# Patient Record
Sex: Male | Born: 1935 | Race: Black or African American | Hispanic: No | Smoking: Former smoker
Health system: Southern US, Community
[De-identification: ages and names within clinical notes are randomized; demographics above are authoritative.]

## PROBLEM LIST (undated history)

## (undated) DIAGNOSIS — I10 Essential (primary) hypertension: Secondary | ICD-10-CM

## (undated) DIAGNOSIS — G459 Transient cerebral ischemic attack, unspecified: Secondary | ICD-10-CM

## (undated) DIAGNOSIS — I4891 Unspecified atrial fibrillation: Secondary | ICD-10-CM

## (undated) HISTORY — PX: EYE SURGERY: SHX253

---

## 2014-12-22 DIAGNOSIS — I10 Essential (primary) hypertension: Secondary | ICD-10-CM | POA: Diagnosis present

## 2018-01-16 DIAGNOSIS — I480 Paroxysmal atrial fibrillation: Secondary | ICD-10-CM | POA: Diagnosis present

## 2019-05-09 ENCOUNTER — Encounter (HOSPITAL_COMMUNITY): Payer: Self-pay

## 2019-05-09 ENCOUNTER — Emergency Department (HOSPITAL_COMMUNITY): Payer: Medicare Other

## 2019-05-09 ENCOUNTER — Other Ambulatory Visit: Payer: Self-pay

## 2019-05-09 ENCOUNTER — Emergency Department (HOSPITAL_COMMUNITY)
Admission: EM | Admit: 2019-05-09 | Discharge: 2019-05-10 | Disposition: A | Discharge status: Home / Self Care | Payer: Medicare Other | Attending: Emergency Medicine | Admitting: Emergency Medicine

## 2019-05-09 DIAGNOSIS — Z8673 Personal history of transient ischemic attack (TIA), and cerebral infarction without residual deficits: Secondary | ICD-10-CM | POA: Insufficient documentation

## 2019-05-09 DIAGNOSIS — R531 Weakness: Secondary | ICD-10-CM | POA: Diagnosis not present

## 2019-05-09 DIAGNOSIS — Z7982 Long term (current) use of aspirin: Secondary | ICD-10-CM | POA: Diagnosis not present

## 2019-05-09 DIAGNOSIS — Z79899 Other long term (current) drug therapy: Secondary | ICD-10-CM | POA: Insufficient documentation

## 2019-05-09 DIAGNOSIS — W1839XA Other fall on same level, initial encounter: Secondary | ICD-10-CM | POA: Insufficient documentation

## 2019-05-09 DIAGNOSIS — Z7901 Long term (current) use of anticoagulants: Secondary | ICD-10-CM | POA: Diagnosis not present

## 2019-05-09 DIAGNOSIS — W19XXXA Unspecified fall, initial encounter: Secondary | ICD-10-CM

## 2019-05-09 DIAGNOSIS — I10 Essential (primary) hypertension: Secondary | ICD-10-CM | POA: Diagnosis not present

## 2019-05-09 HISTORY — DX: Essential (primary) hypertension: I10

## 2019-05-09 NOTE — ED Provider Notes (Signed)
[facility] EMERGENCY DEPARTMENT Provider Note   CSN: [id number] Arrival date & time: 05/09/19  2210     History Chief Complaint  Patient presents with  . Fall    Christian Crawford is a 86 y.o. male.  Patient presents to the ED with a chief complaint of weakness.  He states that he was going to his front room to turn off the lights and states that his legs collapsed and he fell to the floor.  He denies tripping or stumbling.  He denies hitting his head, but cannot recall all of the events surrounding the event.  He states that he gathered his strength and called his daughter.  When his daughter arrived he was lying on the floor.  Has hx of prior TIA.  Denies any hx of seizures.  Patient denies numbness, weakness, tingling, headache, slurred speech, vision changes, chest pain, or SOB.  He denies any pain.  Denies any symptoms of any kind currently.  Daughter questions whether alcohol could have done this.  The history is provided by the patient. No language interpreter was used.       Past Medical History:  Diagnosis Date  . Hypertension     There are no problems to display for this patient.   History reviewed. No pertinent surgical history.     History reviewed. No pertinent family history.  Social History   Tobacco Use  . Smoking status: Never Smoker  . Smokeless tobacco: Never Used  Substance Use Topics  . Alcohol use: Not on file  . Drug use: Not on file    Home Medications Prior to Admission medications   Medication Sig Start Date End Date Taking? Authorizing Provider  allopurinol (ZYLOPRIM) 100 MG tablet Take 100 mg by mouth 2 (two) times daily.   Yes [provider]  amLODipine (NORVASC) 5 MG tablet Take 5 mg by mouth daily.   Yes [provider]  aspirin EC 81 MG tablet Take 81 mg by mouth daily.   Yes [provider]  atorvastatin (LIPITOR) 40 MG tablet Take 40 mg by mouth daily.   Yes [provider]    Garlic (GARLIQUE) 355 MG TBEC Take 400 mg by mouth daily.   Yes [provider]  hydrochlorothiazide (HYDRODIURIL) 25 MG tablet Take 25 mg by mouth daily.   Yes [provider]  losartan (COZAAR) 100 MG tablet Take 100 mg by mouth daily.   Yes [provider]  Metoprolol Tartrate 75 MG TABS Take 75 mg by mouth in the morning and at bedtime.   Yes [provider]  Multiple Vitamins-Minerals (MULTIVITAMIN WITH MINERALS) tablet Take 1 tablet by mouth daily.   Yes [provider]  pantoprazole (PROTONIX) 40 MG tablet Take 40 mg by mouth daily.   Yes [provider]  warfarin (COUMADIN) 5 MG tablet Take 5 mg by mouth daily.   Yes [provider]    Allergies    Patient has no known allergies.  Review of Systems   Review of Systems  All other systems reviewed and are negative.   Physical Exam Updated Vital Signs BP (!) 153/81 (BP Location: Right Arm)   Pulse 62   Temp 97.8 F (36.6 C) (Oral)   Resp (!) 24   SpO2 100%   Physical Exam Vitals and nursing note reviewed.  Constitutional:      Appearance: He is well-developed.  HENT:     Head: Normocephalic and atraumatic.  Eyes:  Conjunctiva/sclera: Conjunctivae normal.  Cardiovascular:     Rate and Rhythm: Normal rate and regular rhythm.     Heart sounds: No murmur.  Pulmonary:     Effort: Pulmonary effort is normal. No respiratory distress.     Breath sounds: Normal breath sounds.     Comments: CTAB Abdominal:     Palpations: Abdomen is soft.     Tenderness: There is no abdominal tenderness.     Comments: No focal abdominal pain  Musculoskeletal:        General: Normal range of motion.     Cervical back: Neck supple.  Skin:    General: Skin is warm and dry.  Neurological:     Mental Status: He is alert and oriented to person, place, and time.     Comments: CN 3-12 intact, speech is clear, movements goal oriented  Psychiatric:        Mood and Affect: Mood  normal.        Behavior: Behavior normal.     ED Results / Procedures / Treatments   Labs (all labs ordered are listed, but only abnormal results are displayed) Labs Reviewed  CBC WITH DIFFERENTIAL/PLATELET  BASIC METABOLIC PANEL  ETHANOL  PROTIME-INR  TROPONIN I (HIGH SENSITIVITY)    EKG None  Radiology DG Chest 2 View  Result Date: 05/09/2019 CLINICAL DATA:  Fall EXAM: CHEST - 2 VIEW COMPARISON:  None. FINDINGS: No significant pleural effusion. Linear atelectasis at the left base. Normal heart size. Aortic atherosclerosis. No pneumothorax. IMPRESSION: No active cardiopulmonary disease.  Mild atelectasis left base. Electronically Signed   By: Jasmine Pang M.D.   On: 05/09/2019 23:44   CT Head Wo Contrast  Result Date: 05/09/2019 CLINICAL DATA:  Head trauma, minor (Age >= 65y) EXAM: CT HEAD WITHOUT CONTRAST TECHNIQUE: Contiguous axial images were obtained from the base of the skull through the vertex without intravenous contrast. COMPARISON:  None. FINDINGS: Brain: No intracranial hemorrhage, mass effect, or midline shift. Moderate age related atrophy. No hydrocephalus. The basilar cisterns are patent. Moderate to advanced chronic small vessel ischemia. No evidence of territorial infarct or acute ischemia. No extra-axial or intracranial fluid collection. Vascular: Atherosclerosis of skullbase vasculature without hyperdense vessel or abnormal calcification. Skull: No fracture or focal lesion. Sinuses/Orbits: Minor mucosal thickening of scattered ethmoid air cells. No sinus fluid level. The mastoid air cells are clear. Included orbits are unremarkable. Other: Right temporal scalp hematoma or scarring. IMPRESSION: 1. No acute intracranial abnormality. No skull fracture. 2. Age related atrophy and chronic small vessel ischemia. Electronically Signed   By: Narda Rutherford M.D.   On: 05/09/2019 23:52    Procedures Procedures (including critical care time)  Medications Ordered in  ED Medications - No data to display  ED Course  I have reviewed the triage vital signs and the nursing notes.  Pertinent labs & imaging results that were available during my care of the patient were reviewed by me and considered in my medical decision making (see chart for details).    MDM Rules/Calculators/A&P                       Patient here with fall.  Questionable syncopal event.  Patient denies any pain.  Denies any injuries.  States that he felt weak and went to the ground.  He states that he does not recall of the events leading up to the fall.  He is reportedly on a blood thinner.  Check labs, EKG, CT  head, chest x-ray.  CT head negative for acute intracranial process.  Chest x-ray negative for active cardiopulmonary disease.  Patient has no complaints at this time.  I contacted his daughter by telephone after receiving his verbal consent to release health information to his daughter.  His daughter questions whether alcohol could have played a factor.  Will check EtOH.  Overall, the patient is well appearing.  Will check orthostats.  Labs are pending.  INR is elevated at 3.0.  Alcohol level is also elevated at 182.  Mild hyponatremia.  Give a liter of normal saline.  Discussed with Dr. Blinda Leatherwood, who recommends that patient sober up in the ED.  Will probably need to hold coumadin for a day or two.  2:22 AM Notified daughter of the plan.   6:17 AM Remains asymptomatic.  Ambulates without difficulty.  DC to home.     Final Clinical Impression(s) / ED Diagnoses Final diagnoses:  Fall, initial encounter    Rx / DC Orders ED Discharge Orders    None       Roxy Horseman, PA-C 05/10/19 0618    Gilda Crease, MD 05/10/19 [phone]

## 2019-05-09 NOTE — ED Notes (Signed)
Pt to room 35 when pt taken upstairs.

## 2019-05-09 NOTE — ED Notes (Signed)
[facility] [place], [phone]. Please call with update.

## 2019-05-09 NOTE — ED Notes (Addendum)
Pt back from xray and CT Orthostatics completed at bedside EKG completed and given to Dr. Blinda Leatherwood

## 2019-05-09 NOTE — ED Notes (Signed)
Patient daughter Sherley Bounds calling asking for an update [phone] please call back

## 2019-05-09 NOTE — ED Triage Notes (Addendum)
Pt brought in by EMS for c/o fall. Pt A&Ox4, in NAD. Breathing easy, non-labored. speaking in full sentences. Pt states he was getting ready for bed when his legs felt weak and gave out. Per EMS, pt did have episode of urinary incontinence after fall. Pt denies LOC. Denies hitting head. Reports he is on coumadin. Hx of TIA 2 yrs ago. Had negative covid test last week. Denies pain. CBG 107 18G LAC established by EMS. Flushes, secured with tape and tegaderm.

## 2019-05-09 NOTE — ED Notes (Addendum)
Pt taken to xray/CT

## 2019-05-10 DIAGNOSIS — R531 Weakness: Secondary | ICD-10-CM | POA: Diagnosis not present

## 2019-05-10 LAB — BASIC METABOLIC PANEL
Anion gap: 13 (ref 5–15)
BUN: 10 mg/dL (ref 8–23)
CO2: 23 mmol/L (ref 22–32)
Calcium: 8.8 mg/dL — ABNORMAL LOW (ref 8.9–10.3)
Chloride: 97 mmol/L — ABNORMAL LOW (ref 98–111)
Creatinine, Ser: 0.94 mg/dL (ref 0.61–1.24)
GFR calc Af Amer: >60 mL/min (ref >60)
GFR calc non Af Amer: >60 mL/min (ref >60)
Glucose, Bld: 94 mg/dL (ref 70–99)
Potassium: 4.3 mmol/L (ref 3.5–5.1)
Sodium: 133 mmol/L — ABNORMAL LOW (ref 135–145)

## 2019-05-10 LAB — PROTIME-INR
INR: 3 — ABNORMAL HIGH (ref 0.8–1.2)
Prothrombin Time: 31.1 seconds — ABNORMAL HIGH (ref 11.4–15.2)

## 2019-05-10 LAB — CBC WITH DIFFERENTIAL/PLATELET
Abs Immature Granulocytes: 0.01 10*3/uL (ref 0.00–0.07)
Basophils Absolute: 0.1 10*3/uL (ref 0.0–0.1)
Basophils Relative: 1 %
Eosinophils Absolute: 0.2 10*3/uL (ref 0.0–0.5)
Eosinophils Relative: 2 %
HCT: 39.8 % (ref 39.0–52.0)
Hemoglobin: 13.8 g/dL (ref 13.0–17.0)
Immature Granulocytes: 0 %
Lymphocytes Relative: 35 %
Lymphs Abs: 2.6 10*3/uL (ref 0.7–4.0)
MCH: 31.3 pg (ref 26.0–34.0)
MCHC: 34.7 g/dL (ref 30.0–36.0)
MCV: 90.2 fL (ref 80.0–100.0)
Monocytes Absolute: 0.8 10*3/uL (ref 0.1–1.0)
Monocytes Relative: 10 %
Neutro Abs: 3.9 10*3/uL (ref 1.7–7.7)
Neutrophils Relative %: 52 %
Platelets: 252 10*3/uL (ref 150–400)
RBC: 4.41 MIL/uL (ref 4.22–5.81)
RDW: 16.8 % — ABNORMAL HIGH (ref 11.5–15.5)
WBC: 7.5 10*3/uL (ref 4.0–10.5)
nRBC: 0 % (ref 0.0–0.2)

## 2019-05-10 LAB — TROPONIN I (HIGH SENSITIVITY)
Troponin I (High Sensitivity): 8 ng/L (ref <18)
Troponin I (High Sensitivity): 8 ng/L (ref <18)

## 2019-05-10 LAB — ETHANOL: Alcohol, Ethyl (B): 182 mg/dL — ABNORMAL HIGH (ref <10)

## 2019-05-10 MED ORDER — SODIUM CHLORIDE 0.9 % IV BOLUS
1000.0000 mL | Freq: Once | INTRAVENOUS | Status: AC
  Administered 2019-05-10: 1000 mL via INTRAVENOUS

## 2019-05-10 MED ORDER — ONDANSETRON HCL 4 MG/2ML IJ SOLN: 4.0000 mg | Freq: Once | INTRAMUSCULAR | Status: DC

## 2019-05-10 NOTE — ED Notes (Signed)
Patient verbalizes understanding of discharge instructions. Opportunity for questioning and answers were provided. All questions answered completely. PIV removed, catheter intact. Site dressed with gauze and tape. Armband removed by staff, pt discharged from ED. Ambulatory with strong, steady gait 

## 2019-05-10 NOTE — ED Notes (Signed)
Ambulated pt in hall. Pt felt no discomfort or dizziness, and was steady on feet. Pt ambulated independently with minimal assistance.

## 2019-05-10 NOTE — ED Notes (Signed)
All labs drawn, labeled with 2 pt identifiers, and sent to lab 

## 2019-05-10 NOTE — ED Notes (Signed)
Pt remains resting on cart in NAD. Alert, speaking in full sentences. Breathing easy, non-labored. VSS. Equal rise and fall of chest noted. Provided with urinal. Denies any other needs at this time. Hourly rounds completed. Call light within reach

## 2019-05-10 NOTE — ED Notes (Signed)
Pt resting on cart in NAD. Breathing easy, non-labored. Equal rise and fall of chest noted. VSS on monitors. Call light remains in reach. Hourly rounds completed. Will continue to monitor. Bed in lowest position, locked, with siderails upx2

## 2019-05-10 NOTE — ED Notes (Signed)
Pt given urinal. Denies any other needs at this time. Hourly rounds completed. Call light within reach. Will continue to monitor. Bed in lowest position, wheels locked. Side rails upx2

## 2019-05-10 NOTE — ED Notes (Signed)
Pt assisted with urinal

## 2019-05-10 NOTE — ED Notes (Signed)
Pt assisted with urinal and provided with phone to call daughter. Remains resting on cart in NAD.  Alert, speaking in full sentences. Breathing easy, non-labored. Call light within reach. Bed in lowest position, locked, with siderails upx2. VSS on monitors.

## 2019-05-10 NOTE — Discharge Instructions (Addendum)
Your weakness and fall are thought to be related to alcohol consumption.  Please use caution when drinking alcohol.  It is also important to stay hydrated with water.  Please don't take your coumadin/warfarin today and tomorrow.  You should have your level recheck by your doctor.  Your level was a little high today.

## 2019-06-29 ENCOUNTER — Ambulatory Visit: Payer: Medicare Other | Attending: Internal Medicine

## 2019-06-29 DIAGNOSIS — Z23 Encounter for immunization: Secondary | ICD-10-CM

## 2019-06-29 NOTE — Progress Notes (Signed)
   Covid-19 Vaccination Clinic  Name:  Christian Crawford    MRN: [id number] DOB: 28-Nov-1933  06/29/2019  [name] was observed post Covid-19 immunization for 15 minutes without incident. He was provided with Vaccine Information Sheet and instruction to access the V-Safe system.   [name] was instructed to call 911 with any severe reactions post vaccine: Marland Kitchen Difficulty breathing  . Swelling of face and throat  . A fast heartbeat  . A bad rash all over body  . Dizziness and weakness   Immunizations Administered    Name Date Dose VIS Date Route   Pfizer COVID-19 Vaccine 06/29/2019  3:56 PM 0.3 mL 03/05/2019 Intramuscular   Manufacturer: [employer], [employer]   Lot: [id number]   NDC: [address]-2

## 2019-07-23 ENCOUNTER — Other Ambulatory Visit: Payer: Self-pay

## 2019-07-23 ENCOUNTER — Encounter (HOSPITAL_COMMUNITY): Payer: Self-pay | Admitting: Emergency Medicine

## 2019-07-23 ENCOUNTER — Inpatient Hospital Stay (HOSPITAL_COMMUNITY)
Admission: EM | Admit: 2019-07-23 | Discharge: 2019-07-26 | DRG: 439 | Disposition: A | Discharge status: Home Health | Payer: Medicare Other | Attending: Family Medicine | Admitting: Family Medicine

## 2019-07-23 DIAGNOSIS — I10 Essential (primary) hypertension: Secondary | ICD-10-CM | POA: Diagnosis present

## 2019-07-23 DIAGNOSIS — F1090 Alcohol use, unspecified, uncomplicated: Secondary | ICD-10-CM | POA: Diagnosis present

## 2019-07-23 DIAGNOSIS — Z79899 Other long term (current) drug therapy: Secondary | ICD-10-CM

## 2019-07-23 DIAGNOSIS — R7401 Elevation of levels of liver transaminase levels: Secondary | ICD-10-CM

## 2019-07-23 DIAGNOSIS — Z20822 Contact with and (suspected) exposure to covid-19: Secondary | ICD-10-CM | POA: Diagnosis present

## 2019-07-23 DIAGNOSIS — Z7982 Long term (current) use of aspirin: Secondary | ICD-10-CM

## 2019-07-23 DIAGNOSIS — F329 Major depressive disorder, single episode, unspecified: Secondary | ICD-10-CM | POA: Diagnosis present

## 2019-07-23 DIAGNOSIS — I77811 Abdominal aortic ectasia: Secondary | ICD-10-CM

## 2019-07-23 DIAGNOSIS — R17 Unspecified jaundice: Secondary | ICD-10-CM

## 2019-07-23 DIAGNOSIS — R739 Hyperglycemia, unspecified: Secondary | ICD-10-CM

## 2019-07-23 DIAGNOSIS — E871 Hypo-osmolality and hyponatremia: Secondary | ICD-10-CM | POA: Diagnosis not present

## 2019-07-23 DIAGNOSIS — Z7901 Long term (current) use of anticoagulants: Secondary | ICD-10-CM

## 2019-07-23 DIAGNOSIS — F1010 Alcohol abuse, uncomplicated: Secondary | ICD-10-CM | POA: Diagnosis present

## 2019-07-23 DIAGNOSIS — K852 Alcohol induced acute pancreatitis without necrosis or infection: Principal | ICD-10-CM

## 2019-07-23 DIAGNOSIS — E785 Hyperlipidemia, unspecified: Secondary | ICD-10-CM | POA: Diagnosis present

## 2019-07-23 DIAGNOSIS — Z789 Other specified health status: Secondary | ICD-10-CM | POA: Diagnosis present

## 2019-07-23 DIAGNOSIS — I4891 Unspecified atrial fibrillation: Secondary | ICD-10-CM | POA: Diagnosis present

## 2019-07-23 DIAGNOSIS — Z8673 Personal history of transient ischemic attack (TIA), and cerebral infarction without residual deficits: Secondary | ICD-10-CM

## 2019-07-23 DIAGNOSIS — R7309 Other abnormal glucose: Secondary | ICD-10-CM

## 2019-07-23 HISTORY — DX: Transient cerebral ischemic attack, unspecified: G45.9

## 2019-07-23 HISTORY — DX: Unspecified atrial fibrillation: I48.91

## 2019-07-23 LAB — COMPREHENSIVE METABOLIC PANEL
ALT: 45 U/L — ABNORMAL HIGH (ref 0–44)
AST: 64 U/L — ABNORMAL HIGH (ref 15–41)
Albumin: 3.9 g/dL (ref 3.5–5.0)
Alkaline Phosphatase: 64 U/L (ref 38–126)
Anion gap: 12 (ref 5–15)
BUN: 7 mg/dL — ABNORMAL LOW (ref 8–23)
CO2: 22 mmol/L (ref 22–32)
Calcium: 9.1 mg/dL (ref 8.9–10.3)
Chloride: 93 mmol/L — ABNORMAL LOW (ref 98–111)
Creatinine, Ser: 0.96 mg/dL (ref 0.61–1.24)
GFR calc Af Amer: >60 mL/min (ref >60)
GFR calc non Af Amer: >60 mL/min (ref >60)
Glucose, Bld: 146 mg/dL — ABNORMAL HIGH (ref 70–99)
Potassium: 3.7 mmol/L (ref 3.5–5.1)
Sodium: 127 mmol/L — ABNORMAL LOW (ref 135–145)
Total Bilirubin: 1.3 mg/dL — ABNORMAL HIGH (ref 0.3–1.2)
Total Protein: 6.6 g/dL (ref 6.5–8.1)

## 2019-07-23 LAB — CBC
HCT: 38.9 % — ABNORMAL LOW (ref 39.0–52.0)
Hemoglobin: 13.6 g/dL (ref 13.0–17.0)
MCH: 31.2 pg (ref 26.0–34.0)
MCHC: 35 g/dL (ref 30.0–36.0)
MCV: 89.2 fL (ref 80.0–100.0)
Platelets: 298 10*3/uL (ref 150–400)
RBC: 4.36 MIL/uL (ref 4.22–5.81)
RDW: 14.3 % (ref 11.5–15.5)
WBC: 12.1 10*3/uL — ABNORMAL HIGH (ref 4.0–10.5)
nRBC: 0 % (ref 0.0–0.2)

## 2019-07-23 MED ORDER — SODIUM CHLORIDE 0.9% FLUSH
3.0000 mL | Freq: Once | INTRAVENOUS | Status: AC
  Administered 2019-07-24: 3 mL via INTRAVENOUS

## 2019-07-23 NOTE — ED Triage Notes (Signed)
Patient reports mid/low abdominal pain with emesis onset today , denies fever or diarrhea .

## 2019-07-23 NOTE — ED Notes (Signed)
Pt actively vomiting in WR.  NA noted at 127.

## 2019-07-24 ENCOUNTER — Emergency Department (HOSPITAL_COMMUNITY): Payer: Medicare Other

## 2019-07-24 ENCOUNTER — Encounter (HOSPITAL_COMMUNITY): Payer: Self-pay | Admitting: Internal Medicine

## 2019-07-24 DIAGNOSIS — F1010 Alcohol abuse, uncomplicated: Secondary | ICD-10-CM | POA: Diagnosis present

## 2019-07-24 DIAGNOSIS — Z79899 Other long term (current) drug therapy: Secondary | ICD-10-CM | POA: Diagnosis not present

## 2019-07-24 DIAGNOSIS — E785 Hyperlipidemia, unspecified: Secondary | ICD-10-CM | POA: Diagnosis present

## 2019-07-24 DIAGNOSIS — E871 Hypo-osmolality and hyponatremia: Secondary | ICD-10-CM | POA: Diagnosis present

## 2019-07-24 DIAGNOSIS — Z7982 Long term (current) use of aspirin: Secondary | ICD-10-CM | POA: Diagnosis not present

## 2019-07-24 DIAGNOSIS — I4891 Unspecified atrial fibrillation: Secondary | ICD-10-CM | POA: Diagnosis present

## 2019-07-24 DIAGNOSIS — Z8673 Personal history of transient ischemic attack (TIA), and cerebral infarction without residual deficits: Secondary | ICD-10-CM | POA: Diagnosis not present

## 2019-07-24 DIAGNOSIS — I10 Essential (primary) hypertension: Secondary | ICD-10-CM | POA: Diagnosis present

## 2019-07-24 DIAGNOSIS — R7401 Elevation of levels of liver transaminase levels: Secondary | ICD-10-CM | POA: Diagnosis present

## 2019-07-24 DIAGNOSIS — Z20822 Contact with and (suspected) exposure to covid-19: Secondary | ICD-10-CM | POA: Diagnosis present

## 2019-07-24 DIAGNOSIS — Z789 Other specified health status: Secondary | ICD-10-CM | POA: Diagnosis not present

## 2019-07-24 DIAGNOSIS — K852 Alcohol induced acute pancreatitis without necrosis or infection: Secondary | ICD-10-CM

## 2019-07-24 DIAGNOSIS — F1090 Alcohol use, unspecified, uncomplicated: Secondary | ICD-10-CM | POA: Diagnosis present

## 2019-07-24 DIAGNOSIS — R17 Unspecified jaundice: Secondary | ICD-10-CM | POA: Diagnosis present

## 2019-07-24 DIAGNOSIS — Z7901 Long term (current) use of anticoagulants: Secondary | ICD-10-CM | POA: Diagnosis not present

## 2019-07-24 DIAGNOSIS — F329 Major depressive disorder, single episode, unspecified: Secondary | ICD-10-CM | POA: Diagnosis present

## 2019-07-24 HISTORY — DX: Alcohol use, unspecified, uncomplicated: F10.90

## 2019-07-24 HISTORY — DX: Alcohol induced acute pancreatitis without necrosis or infection: K85.20

## 2019-07-24 LAB — URINALYSIS, ROUTINE W REFLEX MICROSCOPIC
Bilirubin Urine: NEGATIVE
Glucose, UA: NEGATIVE mg/dL
Hgb urine dipstick: NEGATIVE
Ketones, ur: NEGATIVE mg/dL
Leukocytes,Ua: NEGATIVE
Nitrite: NEGATIVE
Protein, ur: NEGATIVE mg/dL
Specific Gravity, Urine: 1.036 — ABNORMAL HIGH (ref 1.005–1.030)
pH: 7 (ref 5.0–8.0)

## 2019-07-24 LAB — COMPREHENSIVE METABOLIC PANEL
ALT: 123 U/L — ABNORMAL HIGH (ref 0–44)
AST: 304 U/L — ABNORMAL HIGH (ref 15–41)
Albumin: 3.7 g/dL (ref 3.5–5.0)
Alkaline Phosphatase: 95 U/L (ref 38–126)
Anion gap: 11 (ref 5–15)
BUN: 11 mg/dL (ref 8–23)
CO2: 24 mmol/L (ref 22–32)
Calcium: 8.6 mg/dL — ABNORMAL LOW (ref 8.9–10.3)
Chloride: 94 mmol/L — ABNORMAL LOW (ref 98–111)
Creatinine, Ser: 1.09 mg/dL (ref 0.61–1.24)
GFR calc Af Amer: >60 mL/min (ref >60)
GFR calc non Af Amer: >60 mL/min (ref >60)
Glucose, Bld: 141 mg/dL — ABNORMAL HIGH (ref 70–99)
Potassium: 4.1 mmol/L (ref 3.5–5.1)
Sodium: 129 mmol/L — ABNORMAL LOW (ref 135–145)
Total Bilirubin: 4.2 mg/dL — ABNORMAL HIGH (ref 0.3–1.2)
Total Protein: 6.3 g/dL — ABNORMAL LOW (ref 6.5–8.1)

## 2019-07-24 LAB — CBC
HCT: 39 % (ref 39.0–52.0)
Hemoglobin: 13.3 g/dL (ref 13.0–17.0)
MCH: 31.1 pg (ref 26.0–34.0)
MCHC: 34.1 g/dL (ref 30.0–36.0)
MCV: 91.1 fL (ref 80.0–100.0)
Platelets: 223 10*3/uL (ref 150–400)
RBC: 4.28 MIL/uL (ref 4.22–5.81)
RDW: 14.6 % (ref 11.5–15.5)
WBC: 8.9 10*3/uL (ref 4.0–10.5)
nRBC: 0 % (ref 0.0–0.2)

## 2019-07-24 LAB — LIPASE, BLOOD: Lipase: 1785 U/L — ABNORMAL HIGH (ref 11–51)

## 2019-07-24 LAB — MAGNESIUM: Magnesium: 1.6 mg/dL — ABNORMAL LOW (ref 1.7–2.4)

## 2019-07-24 LAB — PROTIME-INR
INR: 2.8 — ABNORMAL HIGH (ref 0.8–1.2)
Prothrombin Time: 28.3 seconds — ABNORMAL HIGH (ref 11.4–15.2)

## 2019-07-24 LAB — PHOSPHORUS: Phosphorus: 3.6 mg/dL (ref 2.5–4.6)

## 2019-07-24 LAB — RESPIRATORY PANEL BY RT PCR (FLU A&B, COVID)
Influenza A by PCR: NEGATIVE
Influenza B by PCR: NEGATIVE
SARS Coronavirus 2 by RT PCR: NEGATIVE

## 2019-07-24 MED ORDER — ACETAMINOPHEN 650 MG RE SUPP: 650.0000 mg | Freq: Four times a day (QID) | RECTAL | Status: DC | PRN

## 2019-07-24 MED ORDER — MORPHINE SULFATE (PF) 2 MG/ML IV SOLN: 2.0000 mg | INTRAVENOUS | Status: DC | PRN

## 2019-07-24 MED ORDER — LORAZEPAM 2 MG/ML IJ SOLN: 1.0000 mg | INTRAMUSCULAR | Status: DC | PRN

## 2019-07-24 MED ORDER — ONDANSETRON HCL 4 MG/2ML IJ SOLN
4.0000 mg | Freq: Four times a day (QID) | INTRAMUSCULAR | Status: DC | PRN
  Administered 2019-07-25: 4 mg via INTRAVENOUS
  Filled 2019-07-24: qty 2

## 2019-07-24 MED ORDER — WARFARIN - PHARMACIST DOSING INPATIENT: Freq: Every day | Status: DC

## 2019-07-24 MED ORDER — FOLIC ACID 1 MG PO TABS
1.0000 mg | ORAL_TABLET | Freq: Every day | ORAL | Status: DC
  Administered 2019-07-24 – 2019-07-26 (×3): 1 mg via ORAL
  Filled 2019-07-24 (×3): qty 1

## 2019-07-24 MED ORDER — ONDANSETRON HCL 4 MG/2ML IJ SOLN
4.0000 mg | Freq: Once | INTRAMUSCULAR | Status: AC
  Administered 2019-07-24: 4 mg via INTRAVENOUS
  Filled 2019-07-24: qty 2

## 2019-07-24 MED ORDER — IOHEXOL 300 MG/ML  SOLN
100.0000 mL | Freq: Once | INTRAMUSCULAR | Status: AC | PRN
  Administered 2019-07-24: 100 mL via INTRAVENOUS

## 2019-07-24 MED ORDER — ENOXAPARIN SODIUM 40 MG/0.4ML ~~LOC~~ SOLN: 40.0000 mg | SUBCUTANEOUS | Status: DC

## 2019-07-24 MED ORDER — THIAMINE HCL 100 MG/ML IJ SOLN
100.0000 mg | Freq: Every day | INTRAMUSCULAR | Status: DC
  Administered 2019-07-25: 100 mg via INTRAVENOUS
  Filled 2019-07-24: qty 2

## 2019-07-24 MED ORDER — SODIUM CHLORIDE 0.9 % IV SOLN: INTRAVENOUS | Status: DC

## 2019-07-24 MED ORDER — AMLODIPINE BESYLATE 5 MG PO TABS
5.0000 mg | ORAL_TABLET | Freq: Every day | ORAL | Status: DC
  Administered 2019-07-24 – 2019-07-26 (×3): 5 mg via ORAL
  Filled 2019-07-24 (×3): qty 1

## 2019-07-24 MED ORDER — METOPROLOL TARTRATE 25 MG PO TABS
75.0000 mg | ORAL_TABLET | Freq: Two times a day (BID) | ORAL | Status: DC
  Administered 2019-07-24 – 2019-07-26 (×5): 75 mg via ORAL
  Filled 2019-07-24 (×5): qty 3

## 2019-07-24 MED ORDER — ADULT MULTIVITAMIN W/MINERALS CH
1.0000 | ORAL_TABLET | Freq: Every day | ORAL | Status: DC
  Administered 2019-07-24 – 2019-07-26 (×3): 1 via ORAL
  Filled 2019-07-24 (×3): qty 1

## 2019-07-24 MED ORDER — SODIUM CHLORIDE 0.9 % IV BOLUS
1000.0000 mL | Freq: Once | INTRAVENOUS | Status: AC
  Administered 2019-07-24: 1000 mL via INTRAVENOUS

## 2019-07-24 MED ORDER — LORAZEPAM 1 MG PO TABS: 1.0000 mg | ORAL_TABLET | ORAL | Status: DC | PRN

## 2019-07-24 MED ORDER — ATORVASTATIN CALCIUM 40 MG PO TABS: 40.0000 mg | ORAL_TABLET | Freq: Every day | ORAL | Status: DC

## 2019-07-24 MED ORDER — LATANOPROST 0.005 % OP SOLN
1.0000 [drp] | Freq: Every day | OPHTHALMIC | Status: DC
  Administered 2019-07-24 – 2019-07-25 (×2): 1 [drp] via OPHTHALMIC
  Filled 2019-07-24: qty 2.5

## 2019-07-24 MED ORDER — ASPIRIN EC 81 MG PO TBEC
81.0000 mg | DELAYED_RELEASE_TABLET | Freq: Every day | ORAL | Status: DC
  Administered 2019-07-24 – 2019-07-26 (×3): 81 mg via ORAL
  Filled 2019-07-24 (×3): qty 1

## 2019-07-24 MED ORDER — ACETAMINOPHEN 325 MG PO TABS: 650.0000 mg | ORAL_TABLET | Freq: Four times a day (QID) | ORAL | Status: DC | PRN

## 2019-07-24 MED ORDER — ALLOPURINOL 100 MG PO TABS
100.0000 mg | ORAL_TABLET | Freq: Two times a day (BID) | ORAL | Status: DC
  Administered 2019-07-24 – 2019-07-26 (×5): 100 mg via ORAL
  Filled 2019-07-24 (×7): qty 1

## 2019-07-24 MED ORDER — THIAMINE HCL 100 MG PO TABS
100.0000 mg | ORAL_TABLET | Freq: Every day | ORAL | Status: DC
  Administered 2019-07-24 – 2019-07-26 (×2): 100 mg via ORAL
  Filled 2019-07-24 (×2): qty 1

## 2019-07-24 MED ORDER — MAGNESIUM SULFATE 2 GM/50ML IV SOLN
2.0000 g | Freq: Once | INTRAVENOUS | Status: AC
  Administered 2019-07-24: 2 g via INTRAVENOUS
  Filled 2019-07-24: qty 50

## 2019-07-24 MED ORDER — PANTOPRAZOLE SODIUM 40 MG PO TBEC
40.0000 mg | DELAYED_RELEASE_TABLET | Freq: Every day | ORAL | Status: DC
  Administered 2019-07-24 – 2019-07-26 (×3): 40 mg via ORAL
  Filled 2019-07-24 (×3): qty 1

## 2019-07-24 MED ORDER — MORPHINE SULFATE (PF) 4 MG/ML IV SOLN
4.0000 mg | Freq: Once | INTRAVENOUS | Status: AC
  Administered 2019-07-24: 4 mg via INTRAVENOUS
  Filled 2019-07-24: qty 1

## 2019-07-24 MED ORDER — WARFARIN SODIUM 5 MG PO TABS
5.0000 mg | ORAL_TABLET | Freq: Every day | ORAL | Status: DC
  Administered 2019-07-24 – 2019-07-25 (×2): 5 mg via ORAL
  Filled 2019-07-24 (×2): qty 1

## 2019-07-24 MED ORDER — ONDANSETRON HCL 4 MG PO TABS: 4.0000 mg | ORAL_TABLET | Freq: Four times a day (QID) | ORAL | Status: DC | PRN

## 2019-07-24 NOTE — H&P (Addendum)
History and Physical    Christian Crawford EVO:[id number] DOB: January 26, 1934 DOA: 07/23/2019  PCP: Virl Cagey, MD  Patient coming from: Home  I have personally briefly reviewed patient's old medical records in [facility]  Chief Complaint: Abd pain  HPI: Christian Crawford is a 86 y.o. male with medical history significant of A.Fib, TIA, on coumadin, HTN.  Pt drinks most days it sounds like.  Presents to the ED due to abd pain, located across lower abdomen.  Onset this afternoon after eating some ice cream and drinking "a double shot" of alcohol before the ice cream.  Pain is severe, 10/10, now down to 8/10.  No known h/o pancreatitis or gallstones in past.   ED Course: CT shows: Extensive diffuse acute pancreatitis, no pancreatic necrosis, pseudocyst.  Lipase 1785  WBC 12.1  [place]: CBD 5.46mm, no gallstones, normal looking gallbladder.   Review of Systems: As per HPI, otherwise all review of systems negative.  Past Medical History:  Diagnosis Date  . A-fib (HCC)   . Hypertension   . TIA (transient ischemic attack)     History reviewed. No pertinent surgical history.   reports that he has never smoked. He has never used smokeless tobacco. He reports current alcohol use. He reports that he does not use drugs.  No Known Allergies  No family history on file. No sick contacts  Prior to Admission medications   Medication Sig Start Date End Date Taking? Authorizing Provider  allopurinol (ZYLOPRIM) 100 MG tablet Take 100 mg by mouth 2 (two) times daily.   Yes [provider]  amLODipine (NORVASC) 5 MG tablet Take 5 mg by mouth daily.   Yes [provider]  aspirin EC 81 MG tablet Take 81 mg by mouth daily.   Yes [provider]  atorvastatin (LIPITOR) 40 MG tablet Take 40 mg by mouth daily.   Yes [provider]  Garlic (GARLIQUE) 400 MG TBEC Take 400 mg by mouth daily.   Yes [provider]  hydrochlorothiazide (HYDRODIURIL) 25 MG  tablet Take 25 mg by mouth daily.   Yes [provider]  latanoprost (XALATAN) 0.005 % ophthalmic solution Place 1 drop into both eyes at bedtime. 06/10/19  Yes [provider]  losartan (COZAAR) 100 MG tablet Take 100 mg by mouth daily.   Yes [provider]  Metoprolol Tartrate 75 MG TABS Take 75 mg by mouth in the morning and at bedtime.   Yes [provider]  Multiple Vitamins-Minerals (MULTIVITAMIN WITH MINERALS) tablet Take 1 tablet by mouth daily.   Yes [provider]  pantoprazole (PROTONIX) 40 MG tablet Take 40 mg by mouth daily.   Yes [provider]  warfarin (COUMADIN) 5 MG tablet Take 5 mg by mouth daily at 4 PM.    Yes [provider]    Physical Exam: Vitals:   07/23/19 2102 07/23/19 2119 07/24/19 0004  BP: 117/69  (!) 141/81  Pulse: 74  87  Resp: 16  16  Temp: 98 F (36.7 C)  97.9 F (36.6 C)  TempSrc: Oral  Oral  SpO2: 96%  99%  Weight:  100 kg   Height:  6\' 1"  (1.854 m)     Constitutional: NAD, calm, comfortable Eyes: PERRL, lids and conjunctivae normal ENMT: Mucous membranes are moist. Posterior pharynx clear of any exudate or lesions.Normal dentition.  Neck: normal, supple, no masses, no thyromegaly Respiratory: clear to auscultation bilaterally, no wheezing, no crackles. Normal respiratory effort. No accessory muscle  use.  Cardiovascular: Regular rate and rhythm, no murmurs / rubs / gallops. No extremity edema. 2+ pedal pulses. No carotid bruits.  Abdomen: epigastric TTP Musculoskeletal: no clubbing / cyanosis. No joint deformity upper and lower extremities. Good ROM, no contractures. Normal muscle tone.  Skin: no rashes, lesions, ulcers. No induration Neurologic: CN 2-12 grossly intact. Sensation intact, DTR normal. Strength 5/5 in all 4.  Psychiatric: Normal judgment and insight. Alert and oriented x 3. Normal mood.    Labs on Admission: I have personally reviewed following labs and imaging  studies  CBC: Recent Labs  Lab 07/23/19 2134  WBC 12.1*  HGB 13.6  HCT 38.9*  MCV 89.2  PLT 298   Basic Metabolic Panel: Recent Labs  Lab 07/23/19 2134  NA 127*  K 3.7  CL 93*  CO2 22  GLUCOSE 146*  BUN 7*  CREATININE 0.96  CALCIUM 9.1   GFR: Estimated Creatinine Clearance: 71.2 mL/min (by C-G formula based on SCr of 0.96 mg/dL). Liver Function Tests: Recent Labs  Lab 07/23/19 2134  AST 64*  ALT 45*  ALKPHOS 64  BILITOT 1.3*  PROT 6.6  ALBUMIN 3.9   Recent Labs  Lab 07/23/19 2134  LIPASE 1,785*   No results for input(s): AMMONIA in the last 168 hours. Coagulation Profile: No results for input(s): INR, PROTIME in the last 168 hours. Cardiac Enzymes: No results for input(s): CKTOTAL, CKMB, CKMBINDEX, TROPONINI in the last 168 hours. BNP (last 3 results) No results for input(s): PROBNP in the last 8760 hours. HbA1C: No results for input(s): HGBA1C in the last 72 hours. CBG: No results for input(s): GLUCAP in the last 168 hours. Lipid Profile: No results for input(s): CHOL, HDL, LDLCALC, TRIG, CHOLHDL, LDLDIRECT in the last 72 hours. Thyroid Function Tests: No results for input(s): TSH, T4TOTAL, FREET4, T3FREE, THYROIDAB in the last 72 hours. Anemia Panel: No results for input(s): VITAMINB12, FOLATE, FERRITIN, TIBC, IRON, RETICCTPCT in the last 72 hours. Urine analysis: No results found for: COLORURINE, APPEARANCEUR, LABSPEC, PHURINE, GLUCOSEU, HGBUR, BILIRUBINUR, KETONESUR, PROTEINUR, UROBILINOGEN, NITRITE, LEUKOCYTESUR  Radiological Exams on Admission: CT ABDOMEN PELVIS W CONTRAST  Result Date: 07/24/2019 CLINICAL DATA:  Mid abdominal pain EXAM: CT ABDOMEN AND PELVIS WITH CONTRAST TECHNIQUE: Multidetector CT imaging of the abdomen and pelvis was performed using the standard protocol following bolus administration of intravenous contrast. CONTRAST:  OMNIPAQUE IOHEXOL 300 MG/ML  SOLN COMPARISON:  None. FINDINGS: Lower chest: The visualized heart size  within normal limits. No pericardial fluid/thickening. No hiatal hernia. Minimal basilar atelectasis is seen. Hepatobiliary: There is diffuse low density seen throughout the liver parenchyma.The main portal vein is patent. No evidence of calcified gallstones, gallbladder wall thickening or biliary dilatation. Pancreas: There is significant fat stranding changes seen throughout the entirety of the pancreas. There is still normal pancreatic enhancement seen throughout. No pancreatic ductal dilatation. Probable vascular calcifications seen within the distal body of the pancreas. No surrounding pancreatic fluid collections or evidence of pancreatic necrosis. Fat stranding changes are seen extending along the retroperitoneum into the deep pelvis and bilateral pericolic gutters. Spleen: Normal in size without focal abnormality. Adrenals/Urinary Tract: Both adrenal glands appear normal. The kidneys and collecting system appear normal without evidence of urinary tract calculus or hydronephrosis. Bladder is unremarkable. Stomach/Bowel: There is a small hiatal hernia present. Fat stranding changes are seen surrounding the first and second portion of the duodenum. The remainder of the small bowel is unremarkable. There is scattered colonic diverticula without diverticulitis. The appendix is unremarkable.  Vascular/Lymphatic: There are no enlarged mesenteric, retroperitoneal, or pelvic lymph nodes. Scattered aortic atherosclerotic calcifications are seen without aneurysmal dilatation. The intra-abdominal aorta measures up to 2.8 cm at the level of the origin of the celiac plexus. Reproductive: The prostate is heterogeneous and enlarged Other: No evidence of abdominal wall mass or hernia. Musculoskeletal: No acute or significant osseous findings. IMPRESSION: 1. Findings suggestive of extensive diffuse acute pancreatitis with inflammatory changes extending into the bilateral pericolic gutters. No evidence of pancreatic necrosis or  surrounding pseudocyst. 2. Surrounding inflammatory changes around the duodenum. 3. 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 Recommend followup by ultrasound in 5 years. This recommendation follows ACR consensus guidelines: White Paper of the ACR Incidental Findings Committee II on Vascular Findings. J Am Coll Radiol 2013; 10:789-794. 4. Aortic aneurysm NOS (ICD10-I71.9) 5.  Aortic Atherosclerosis (ICD10-I70.0). Electronically Signed   By: Prudencio Pair M.D.   On: 07/24/2019 02:37   US Abdomen Limited  Result Date: 07/24/2019 CLINICAL DATA:  Pancreatitis EXAM: ULTRASOUND ABDOMEN LIMITED RIGHT UPPER QUADRANT COMPARISON:  CT same day FINDINGS: Gallbladder: Layering echogenic gallbladder sludge is present. No sonographic Percell Miller sign is seen. The gallbladder wall is normal in appearance measuring 2 mm. Common bile duct: Diameter: 5.2 mm Liver: Increased echotexture seen throughout. No focal abnormality or biliary ductal dilatation. Portal vein is patent on color Doppler imaging with normal direction of blood flow towards the liver. Other: None. IMPRESSION: Gallbladder sludge.  No evidence of acute cholecystitis. Hepatic steatosis Electronically Signed   By: Prudencio Pair M.D.   On: 07/24/2019 04:03    EKG: Independently reviewed.  Assessment/Plan Principal Problem:   Acute alcoholic pancreatitis Active Problems:   Heavy alcohol use   HTN (hypertension)   A-fib (HCC)    1. Acute alcoholic pancreatitis - 1. IVF: 1L bolus in ED and NS at 125 cc/hr 2. Strict intake and output 3. NPO 4. Morphine PRN pain 5. zofran PRN nausea 6. Instructed to stop drinking EtOH 2. Heavy EtOH use - 1. CIWA 3. HTN - 1. Cont Metoprolol and norvasc 2. Holding losartan and HCTZ 4. A.Fib - 1. Cont metoprolol 2. Tele monitor 3. Cont coumadin 4. INR pending  DVT prophylaxis: Coumadin per pharm Code Status: Full Family Communication: Family at bedside Disposition  Plan: Home after pancreatitis improved and taking POs Consults called: None Admission status: Place in 40   [facility] [place], [place] Hospitalists  How to contact the [facility] Attending or Consulting provider [place] or covering provider during after hours [place], for this patient?  1. Check the care team in [facility] and look for a) attending/consulting [facility] provider listed and b) the [facility] team listed 2. Log into [email or web address]  Amion Physician Scheduling and messaging for groups and whole hospitals  On call and physician scheduling software for group practices, residents, hospitalists and other medical providers for call, clinic, rotation and shift schedules. OnCall [employer] is a hospital-wide system for scheduling doctors and paging doctors on call. EasyPlot is for scientific plotting and data analysis.  [email or web address]  and use [place]'s universal password to access. If you do not have the password, please contact the hospital operator.  3. Locate the [facility] provider you are looking for under [facility] and page to a number that you can be directly reached. 4. If you still have difficulty reaching the provider, please page the [facility] (Director on Call) for the Hospitalists listed on amion for assistance.  07/24/2019, 4:32 AM

## 2019-07-24 NOTE — Progress Notes (Signed)
ANTICOAGULATION CONSULT NOTE - Initial Consult  Pharmacy Consult for Coumadin Indication: atrial fibrillation  No Known Allergies  Patient Measurements: Height: 6\' 1"  (185.4 cm) Weight: 100 kg (220 lb 7.4 oz) IBW/kg (Calculated) : 79.9  Vital Signs: Temp: 97.9 F (36.6 C) (05/01 0004) Temp Source: Oral (05/01 0004) BP: 155/83 (05/01 0345) Pulse Rate: 85 (05/01 0345)  Labs: Recent Labs    07/23/19 2134 07/24/19 0622  HGB 13.6 13.3  HCT 38.9* 39.0  PLT 298 223  LABPROT  --  28.3*  INR  --  2.8*  CREATININE 0.96 1.09    Estimated Creatinine Clearance: 62.7 mL/min (by C-G formula based on SCr of 1.09 mg/dL).   Medical History: Past Medical History:  Diagnosis Date  . A-fib (HCC)   . Hypertension   . TIA (transient ischemic attack)     Medications:  See electronic med rec  Assessment: 86 y.o. M presents with abd pain. Pt on warfarin PTA for afib. Admission INR 2.8. CBC ok on admission. Home dose: Coumadin 5mg  po daily  Goal of Therapy:  INR 2-3 Monitor platelets by anticoagulation protocol: Yes   Plan:  Daily INR Coumadin 5mg  po daily  97, PharmD, BCPS Please see amion for complete clinical pharmacist phone list 07/24/2019,7:11 AM

## 2019-07-24 NOTE — ED Provider Notes (Signed)
[facility] EMERGENCY DEPARTMENT Provider Note   CSN: [id number] Arrival date & time: 07/23/19  2042   History Chief Complaint  Patient presents with  . Abdominal Pain    Christian Crawford is a 86 y.o. male.  The history is provided by the patient.  Abdominal Pain He has history of hypertension and alcohol abuse and comes in because of pain across his lower abdomen.  He states it started this afternoon after eating some ice cream.  He does admit to drinking "a double shot" of alcohol before he had the ice cream.  There has been associated nausea and vomiting but no radiation of pain to the back.  Pain was severe and he rated it at 10/10, but has subsided to where he states it is down to 8/10.  Nothing makes it better, nothing makes it worse.  He denies history of pancreatitis in the past.  He does not have any known history of gallstones.  Past Medical History:  Diagnosis Date  . Hypertension     There are no problems to display for this patient.   History reviewed. No pertinent surgical history.     No family history on file.  Social History   Tobacco Use  . Smoking status: Never Smoker  . Smokeless tobacco: Never Used  Substance Use Topics  . Alcohol use: Yes  . Drug use: Never    Home Medications Prior to Admission medications   Medication Sig Start Date End Date Taking? Authorizing Provider  allopurinol (ZYLOPRIM) 100 MG tablet Take 100 mg by mouth 2 (two) times daily.    [provider]  amLODipine (NORVASC) 5 MG tablet Take 5 mg by mouth daily.    [provider]  aspirin EC 81 MG tablet Take 81 mg by mouth daily.    [provider]  atorvastatin (LIPITOR) 40 MG tablet Take 40 mg by mouth daily.    [provider]  Garlic (GARLIQUE) 242 MG TBEC Take 400 mg by mouth daily.    [provider]  hydrochlorothiazide (HYDRODIURIL) 25 MG tablet Take 25 mg by mouth daily.    [provider]    losartan (COZAAR) 100 MG tablet Take 100 mg by mouth daily.    [provider]  Metoprolol Tartrate 75 MG TABS Take 75 mg by mouth in the morning and at bedtime.    [provider]  Multiple Vitamins-Minerals (MULTIVITAMIN WITH MINERALS) tablet Take 1 tablet by mouth daily.    [provider]  pantoprazole (PROTONIX) 40 MG tablet Take 40 mg by mouth daily.    [provider]  warfarin (COUMADIN) 5 MG tablet Take 5 mg by mouth daily. @ 1700    [provider]    Allergies    Patient has no known allergies.  Review of Systems   Review of Systems  Gastrointestinal: Positive for abdominal pain.  All other systems reviewed and are negative.   Physical Exam Updated Vital Signs BP (!) 141/81 (BP Location: Right Arm)   Pulse 87   Temp 97.9 F (36.6 C) (Oral)   Resp 16   Ht 6\' 1"  (1.854 m)   Wt 100 kg   SpO2 99%   BMI 29.09 kg/m   Physical Exam Vitals and nursing note reviewed.   86 year old male, resting comfortably and in no acute distress. Vital signs are significant for borderline elevated blood pressure. Oxygen saturation is 99%, which is normal. Head is normocephalic and atraumatic. PERRLA,  EOMI. Oropharynx is clear. Neck is nontender and supple without adenopathy or JVD. Back is nontender and there is no CVA tenderness. Lungs are clear without rales, wheezes, or rhonchi. Chest is nontender. Heart has regular rate and rhythm without murmur. Abdomen is soft, flat, with mild tenderness rather diffusely but worse across the lower abdomen.  There is negative Murphy sign.  There are no masses or hepatosplenomegaly and peristalsis is hypoactive. Extremities have no cyanosis or edema, full range of motion is present. Skin is warm and dry without rash. Neurologic: Mental status is normal, cranial nerves are intact, there are no motor or sensory deficits.  ED Results / Procedures / Treatments   Labs (all labs ordered are listed, but  only abnormal results are displayed) Labs Reviewed  LIPASE, BLOOD - Abnormal; Notable for the following components:      Result Value   Lipase 1,785 (*)    All other components within normal limits  COMPREHENSIVE METABOLIC PANEL - Abnormal; Notable for the following components:   Sodium 127 (*)    Chloride 93 (*)    Glucose, Bld 146 (*)    BUN 7 (*)    AST 64 (*)    ALT 45 (*)    Total Bilirubin 1.3 (*)    All other components within normal limits  CBC - Abnormal; Notable for the following components:   WBC 12.1 (*)    HCT 38.9 (*)    All other components within normal limits  URINALYSIS, ROUTINE W REFLEX MICROSCOPIC    EKG None  Radiology CT ABDOMEN PELVIS W CONTRAST  Result Date: 07/24/2019 CLINICAL DATA:  Mid abdominal pain EXAM: CT ABDOMEN AND PELVIS WITH CONTRAST TECHNIQUE: Multidetector CT imaging of the abdomen and pelvis was performed using the standard protocol following bolus administration of intravenous contrast. CONTRAST:  OMNIPAQUE IOHEXOL 300 MG/ML  SOLN COMPARISON:  None. FINDINGS: Lower chest: The visualized heart size within normal limits. No pericardial fluid/thickening. No hiatal hernia. Minimal basilar atelectasis is seen. Hepatobiliary: There is diffuse low density seen throughout the liver parenchyma.The main portal vein is patent. No evidence of calcified gallstones, gallbladder wall thickening or biliary dilatation. Pancreas: There is significant fat stranding changes seen throughout the entirety of the pancreas. There is still normal pancreatic enhancement seen throughout. No pancreatic ductal dilatation. Probable vascular calcifications seen within the distal body of the pancreas. No surrounding pancreatic fluid collections or evidence of pancreatic necrosis. Fat stranding changes are seen extending along the retroperitoneum into the deep pelvis and bilateral pericolic gutters. Spleen: Normal in size without focal abnormality. Adrenals/Urinary Tract: Both  adrenal glands appear normal. The kidneys and collecting system appear normal without evidence of urinary tract calculus or hydronephrosis. Bladder is unremarkable. Stomach/Bowel: There is a small hiatal hernia present. Fat stranding changes are seen surrounding the first and second portion of the duodenum. The remainder of the small bowel is unremarkable. There is scattered colonic diverticula without diverticulitis. The appendix is unremarkable. Vascular/Lymphatic: There are no enlarged mesenteric, retroperitoneal, or pelvic lymph nodes. Scattered aortic atherosclerotic calcifications are seen without aneurysmal dilatation. The intra-abdominal aorta measures up to 2.8 cm at the level of the origin of the celiac plexus. Reproductive: The prostate is heterogeneous and enlarged Other: No evidence of abdominal wall mass or hernia. Musculoskeletal: No acute or significant osseous findings. IMPRESSION: 1. Findings suggestive of extensive diffuse acute pancreatitis with inflammatory changes extending into the bilateral pericolic gutters. No evidence of pancreatic necrosis or surrounding pseudocyst. 2. Surrounding inflammatory changes  around the duodenum. 3. 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 Recommend followup by ultrasound in 5 years. This recommendation follows ACR consensus guidelines: White Paper of the ACR Incidental Findings Committee II on Vascular Findings. J Am Coll Radiol 2013; 10:789-794. 4. Aortic aneurysm NOS (ICD10-I71.9) 5.  Aortic Atherosclerosis (ICD10-I70.0). Electronically Signed   By: Jonna Clark M.D.   On: 07/24/2019 02:37    Procedures Procedures  Medications Ordered in ED Medications  sodium chloride flush (NS) 0.9 % injection 3 mL (has no administration in time range)    ED Course  I have reviewed the triage vital signs and the nursing notes.  Pertinent labs & imaging results that were available during my care of the patient  were reviewed by me and considered in my medical decision making (see chart for details).  MDM Rules/Calculators/A&P Abdominal pain.  When I saw patient, labs did come back showing markedly elevated lipase consistent with pancreatitis.  Also, mild elevation of transaminases and bilirubin consistent with his known history of alcohol abuse.  Glucose is mildly elevated will need to be followed as an outpatient.  Hyponatremia is present likely secondary to alcohol abuse.  He is sent for CT of abdomen and pelvis which showed extensive inflammatory changes around the pancreas consistent with acute pancreatitis.  Also, incidental finding of ectatic abdominal aorta at risk for aneurysm development with need for follow-up ultrasound in 5 years.  He will be sent for ultrasound to look for evidence of cholelithiasis and choledocholithiasis.  Given his age and severity of pancreatic inflammation, decision is made to admit him here.  Case is discussed with Dr. Julian Reil of [facility], who agrees to admit the patient.  Final Clinical Impression(s) / ED Diagnoses Final diagnoses:  Alcohol-induced acute pancreatitis without infection or necrosis  Hyponatremia  Elevated transaminase level  Serum total bilirubin elevated  Ectatic abdominal aorta (HCC)  Elevated random blood glucose level    Rx / DC Orders ED Discharge Orders    None       Dione Booze, MD 07/24/19 [phone]

## 2019-07-24 NOTE — Progress Notes (Signed)
Per HPI: Christian Crawford is a 86 y.o. male with medical history significant of A.Fib, TIA, on coumadin, HTN.  Pt drinks most days it sounds like.  Presents to the ED due to abd pain, located across lower abdomen.  Onset this afternoon after eating some ice cream and drinking "a double shot" of alcohol before the ice cream.  Pain is severe, 10/10, now down to 8/10.  No known h/o pancreatitis or gallstones in past.  5/1: Patient was admitted with extensive diffuse acute pancreatitis in the setting of alcohol use.  He denies any abdominal pain, nausea or vomiting this morning.  Repeat bolus of IV fluid and maintain on aggressive IV fluid.  Start clear liquid diet since symptomatically improved.  Discussed case with GI given transaminitis.  We will continue to follow in a.m. and trend and will consider formal consultation if needed at that point.  Abdominal ultrasound without any acute concerns at this time.  May need to consider MRCP during this admission if trend is worsening.  Total care time: 35 minutes.

## 2019-07-25 LAB — COMPREHENSIVE METABOLIC PANEL
ALT: 120 U/L — ABNORMAL HIGH (ref 0–44)
AST: 156 U/L — ABNORMAL HIGH (ref 15–41)
Albumin: 3 g/dL — ABNORMAL LOW (ref 3.5–5.0)
Alkaline Phosphatase: 92 U/L (ref 38–126)
Anion gap: 9 (ref 5–15)
BUN: 10 mg/dL (ref 8–23)
CO2: 24 mmol/L (ref 22–32)
Calcium: 8.4 mg/dL — ABNORMAL LOW (ref 8.9–10.3)
Chloride: 99 mmol/L (ref 98–111)
Creatinine, Ser: 0.89 mg/dL (ref 0.61–1.24)
GFR calc Af Amer: >60 mL/min (ref >60)
GFR calc non Af Amer: >60 mL/min (ref >60)
Glucose, Bld: 117 mg/dL — ABNORMAL HIGH (ref 70–99)
Potassium: 3.7 mmol/L (ref 3.5–5.1)
Sodium: 132 mmol/L — ABNORMAL LOW (ref 135–145)
Total Bilirubin: 4 mg/dL — ABNORMAL HIGH (ref 0.3–1.2)
Total Protein: 5.7 g/dL — ABNORMAL LOW (ref 6.5–8.1)

## 2019-07-25 LAB — CBC
HCT: 34.1 % — ABNORMAL LOW (ref 39.0–52.0)
Hemoglobin: 12 g/dL — ABNORMAL LOW (ref 13.0–17.0)
MCH: 31.7 pg (ref 26.0–34.0)
MCHC: 35.2 g/dL (ref 30.0–36.0)
MCV: 90 fL (ref 80.0–100.0)
Platelets: 200 10*3/uL (ref 150–400)
RBC: 3.79 MIL/uL — ABNORMAL LOW (ref 4.22–5.81)
RDW: 14.5 % (ref 11.5–15.5)
WBC: 12.4 10*3/uL — ABNORMAL HIGH (ref 4.0–10.5)
nRBC: 0 % (ref 0.0–0.2)

## 2019-07-25 LAB — PROTIME-INR
INR: 2.6 — ABNORMAL HIGH (ref 0.8–1.2)
Prothrombin Time: 27.3 seconds — ABNORMAL HIGH (ref 11.4–15.2)

## 2019-07-25 LAB — MAGNESIUM: Magnesium: 1.9 mg/dL (ref 1.7–2.4)

## 2019-07-25 NOTE — Plan of Care (Signed)

## 2019-07-25 NOTE — Discharge Instructions (Signed)

## 2019-07-25 NOTE — Progress Notes (Signed)
ANTICOAGULATION CONSULT NOTE  Pharmacy Consult for Coumadin Indication: atrial fibrillation  No Known Allergies  Patient Measurements: Height: 6\' 1"  (185.4 cm) Weight: 100 kg (220 lb 7.4 oz) IBW/kg (Calculated) : 79.9  Vital Signs: Temp: 98.3 F (36.8 C) (05/02 0740) Temp Source: Oral (05/01 2242) BP: 139/85 (05/02 0740) Pulse Rate: 97 (05/02 0740)  Labs: Recent Labs    07/23/19 2134 07/23/19 2134 07/24/19 0622 07/25/19 0513  HGB 13.6   < > 13.3 12.0*  HCT 38.9*  --  39.0 34.1*  PLT 298  --  223 200  LABPROT  --   --  28.3* 27.3*  INR  --   --  2.8* 2.6*  CREATININE 0.96  --  1.09 0.89   < > = values in this interval not displayed.    Estimated Creatinine Clearance: 76.8 mL/min (by C-G formula based on SCr of 0.89 mg/dL).   Medical History: Past Medical History:  Diagnosis Date  . A-fib (HCC)   . Hypertension   . TIA (transient ischemic attack)     Assessment: 86 y.o. M presents with abd pain. Pt on warfarin PTA for afib. Admission INR 2.8.   INR is therapeutic at 2.6. Hgb 12, plt 200. No s/sx of bleeding documented.   Home dose: Coumadin 5mg  po daily  Goal of Therapy:  INR 2-3 Monitor platelets by anticoagulation protocol: Yes   Plan:  Coumadin 5mg  po daily Daily INR, CBC, and for s/sx of bleeding    97, PharmD, BCCCP Clinical Pharmacist  Phone: [phone] 07/25/2019 8:55 AM  Please check AMION for all [facility] phone numbers After 10:00 PM, call Main Pharmacy [phone]

## 2019-07-25 NOTE — Progress Notes (Addendum)
PROGRESS NOTE    Christian Crawford  DJM:[id number] DOB: Nov 18, 1933 DOA: 07/23/2019 PCP: Virl Cagey, MD   Brief Narrative:  Per HPI: Christian Crawford a 86 y.o.malewith medical history significant ofA.Fib, TIA, on coumadin, HTN.  Pt drinks most days it sounds like. Presents to the ED due to abd pain, located across lower abdomen. Onset this afternoon after eating some ice cream and drinking "a double shot" of alcohol before the ice cream. Pain is severe, 10/10, now down to 8/10. No known h/o pancreatitis or gallstones in past.  5/1: Patient was admitted with extensive diffuse acute pancreatitis in the setting of alcohol use.  He denies any abdominal pain, nausea or vomiting this morning.  Repeat bolus of IV fluid and maintain on aggressive IV fluid.  Start clear liquid diet since symptomatically improved.  Discussed case with GI given transaminitis.  We will continue to follow in a.m. and trend and will consider formal consultation if needed at that point.  Abdominal ultrasound without any acute concerns at this time.  May need to consider MRCP during this admission if trend is worsening.  5/2: Patient is having much less abdominal pain today with no nausea or vomiting noted.  He appears to be tolerating clear liquid diet and would like to have his diet advanced.  We will plan to start full liquids today and continue aggressive IV fluid hydration.  LFTs are downward trending.  Anticipate discharge in the next 24-48 hours pending further decline in LFTs and if tolerating diet.  Continue aggressive IV fluid.  Assessment & Plan:   Principal Problem:   Acute alcoholic pancreatitis Active Problems:   Heavy alcohol use   HTN (hypertension)   A-fib (HCC)   Acute alcoholic pancreatitis with associated transaminitis -Advance diet to full liquids today -Monitor repeat LFTs in a.m. to ensure downward trend -Continue aggressive IV fluid -Counseled on alcohol cessation  Chronic, heavy  alcohol use -No signs of withdrawal noted -CIWA protocol -Counseled on cessation  Hypertension-stable -Continue metoprolol and Norvasc -Holding losartan and HCTZ  Atrial fibrillation-rate controlled -Continue metoprolol -Continue Coumadin for anticoagulation with therapeutic INR  Dyslipidemia -Holding statin for now given transaminitis -May resume on discharge once elevations have diminished  DVT prophylaxis: Therapeutic on Coumadin Code Status: Full Family Communication: None at bedside, tried calling daughter with no response Disposition Plan: Continue aggressive IV fluid hydration and monitoring of LFTs.  Monitor clinically with dietary advancement.  Anticipate discharge in next 24-48 hours of stable.   Consultants:   None  Procedures:   None  Antimicrobials:   None   Subjective: Patient seen and evaluated today with improving abdominal pain with no further nausea or vomiting.  Tolerating clear liquids.  Objective: Vitals:   07/24/19 1131 07/24/19 1132 07/24/19 1633 07/24/19 2242  BP: 131/87 131/87 (!) 156/82 (!) 149/77  Pulse:  (!) 117 86 91  Resp:   18 16  Temp: 97.7 F (36.5 C) 97.7 F (36.5 C) (!) 97.5 F (36.4 C) 98.3 F (36.8 C)  TempSrc: Oral Oral  Oral  SpO2: 97% 96% 96% 98%  Weight:      Height:        Intake/Output Summary (Last 24 hours) at 07/25/2019 0848 Last data filed at 07/25/2019 0400 Gross per 24 hour  Intake 2893 ml  Output 350 ml  Net 2543 ml   Filed Weights   07/23/19 2119  Weight: 100 kg    Examination:  General exam: Appears calm and comfortable  Respiratory system: Clear  to auscultation. Respiratory effort normal. Cardiovascular system: S1 & S2 heard, RRR. No JVD, murmurs, rubs, gallops or clicks. No pedal edema. Gastrointestinal system: Abdomen is nondistended, soft and nontender. No organomegaly or masses felt. Normal bowel sounds heard. Central nervous system: Alert and oriented. No focal neurological  deficits. Extremities: Symmetric 5 x 5 power. Skin: No rashes, lesions or ulcers Psychiatry: Judgement and insight appear normal. Mood & affect appropriate.     Data Reviewed: I have personally reviewed following labs and imaging studies  CBC: Recent Labs  Lab 07/23/19 2134 07/24/19 0622 07/25/19 0513  WBC 12.1* 8.9 12.4*  HGB 13.6 13.3 12.0*  HCT 38.9* 39.0 34.1*  MCV 89.2 91.1 90.0  PLT 298 223 220   Basic Metabolic Panel: Recent Labs  Lab 07/23/19 2134 07/24/19 0622 07/25/19 0513  NA 127* 129* 132*  K 3.7 4.1 3.7  CL 93* 94* 99  CO2 22 24 24   GLUCOSE 146* 141* 117*  BUN 7* 11 10  CREATININE 0.96 1.09 0.89  CALCIUM 9.1 8.6* 8.4*  MG  --  1.6* 1.9  PHOS  --  3.6  --    GFR: Estimated Creatinine Clearance: 76.8 mL/min (by C-G formula based on SCr of 0.89 mg/dL). Liver Function Tests: Recent Labs  Lab 07/23/19 2134 07/24/19 0622 07/25/19 0513  AST 64* 304* 156*  ALT 45* 123* 120*  ALKPHOS 64 95 92  BILITOT 1.3* 4.2* 4.0*  PROT 6.6 6.3* 5.7*  ALBUMIN 3.9 3.7 3.0*   Recent Labs  Lab 07/23/19 2134  LIPASE 1,785*   No results for input(s): AMMONIA in the last 168 hours. Coagulation Profile: Recent Labs  Lab 07/24/19 0622 07/25/19 0513  INR 2.8* 2.6*   Cardiac Enzymes: No results for input(s): CKTOTAL, CKMB, CKMBINDEX, TROPONINI in the last 168 hours. BNP (last 3 results) No results for input(s): PROBNP in the last 8760 hours. HbA1C: No results for input(s): HGBA1C in the last 72 hours. CBG: No results for input(s): GLUCAP in the last 168 hours. Lipid Profile: No results for input(s): CHOL, HDL, LDLCALC, TRIG, CHOLHDL, LDLDIRECT in the last 72 hours. Thyroid Function Tests: No results for input(s): TSH, T4TOTAL, FREET4, T3FREE, THYROIDAB in the last 72 hours. Anemia Panel: No results for input(s): VITAMINB12, FOLATE, FERRITIN, TIBC, IRON, RETICCTPCT in the last 72 hours. Sepsis Labs: No results for input(s): PROCALCITON, LATICACIDVEN in the  last 168 hours.  Recent Results (from the past 240 hour(s))  Respiratory Panel by RT PCR (Flu A&B, Covid) - Urine, Clean Catch     Status: None   Collection Time: 07/24/19  4:35 AM   Specimen: Urine, Clean Catch  Result Value Ref Range Status   SARS Coronavirus 2 by RT PCR NEGATIVE NEGATIVE Final    Comment: (NOTE) SARS-CoV-2 target nucleic acids are NOT DETECTED. The SARS-CoV-2 RNA is generally detectable in upper respiratoy specimens during the acute phase of infection. The lowest concentration of SARS-CoV-2 viral copies this assay can detect is 131 copies/mL. A negative result does not preclude SARS-Cov-2 infection and should not be used as the sole basis for treatment or other patient management decisions. A negative result may occur with  improper specimen collection/handling, submission of specimen other than nasopharyngeal swab, presence of viral mutation(s) within the areas targeted by this assay, and inadequate number of viral copies (<131 copies/mL). A negative result must be combined with clinical observations, patient history, and epidemiological information. The expected result is Negative. Fact Sheet for Patients:  PinkCheek.be Fact Sheet for Healthcare Providers:  https://www.young.biz/ This test is not yet ap proved or cleared by the [place] and  has been authorized for detection and/or diagnosis of SARS-CoV-2 by FDA under an Emergency Use Authorization (EUA). This EUA will remain  in effect (meaning this test can be used) for the duration of the COVID-19 declaration under Section 564(b)(1) of the Act, 21 U.S.C. section 360bbb-3(b)(1), unless the authorization is terminated or revoked sooner.    Influenza A by PCR NEGATIVE NEGATIVE Final   Influenza B by PCR NEGATIVE NEGATIVE Final    Comment: (NOTE) The Xpert Xpress SARS-CoV-2/FLU/RSV assay is intended as an aid in  the diagnosis of influenza from  Nasopharyngeal swab specimens and  should not be used as a sole basis for treatment. Nasal washings and  aspirates are unacceptable for Xpert Xpress SARS-CoV-2/FLU/RSV  testing. Fact Sheet for Patients: https://www.moore.com/ Fact Sheet for Healthcare Providers: https://www.young.biz/ This test is not yet approved or cleared by the [place] FDA and  has been authorized for detection and/or diagnosis of SARS-CoV-2 by  FDA under an Emergency Use Authorization (EUA). This EUA will remain  in effect (meaning this test can be used) for the duration of the  Covid-19 declaration under Section 564(b)(1) of the Act, 21  U.S.C. section 360bbb-3(b)(1), unless the authorization is  terminated or revoked. Performed at [facility], [address] [place], [place] [address]          Radiology Studies: CT ABDOMEN PELVIS W CONTRAST  Result Date: 07/24/2019 CLINICAL DATA:  Mid abdominal pain EXAM: CT ABDOMEN AND PELVIS WITH CONTRAST TECHNIQUE: Multidetector CT imaging of the abdomen and pelvis was performed using the standard protocol following bolus administration of intravenous contrast. CONTRAST:  OMNIPAQUE IOHEXOL 300 MG/ML  SOLN COMPARISON:  None. FINDINGS: Lower chest: The visualized heart size within normal limits. No pericardial fluid/thickening. No hiatal hernia. Minimal basilar atelectasis is seen. Hepatobiliary: There is diffuse low density seen throughout the liver parenchyma.The main portal vein is patent. No evidence of calcified gallstones, gallbladder wall thickening or biliary dilatation. Pancreas: There is significant fat stranding changes seen throughout the entirety of the pancreas. There is still normal pancreatic enhancement seen throughout. No pancreatic ductal dilatation. Probable vascular calcifications seen within the distal body of the pancreas. No surrounding pancreatic fluid collections or evidence of pancreatic necrosis.  Fat stranding changes are seen extending along the retroperitoneum into the deep pelvis and bilateral pericolic gutters. Spleen: Normal in size without focal abnormality. Adrenals/Urinary Tract: Both adrenal glands appear normal. The kidneys and collecting system appear normal without evidence of urinary tract calculus or hydronephrosis. Bladder is unremarkable. Stomach/Bowel: There is a small hiatal hernia present. Fat stranding changes are seen surrounding the first and second portion of the duodenum. The remainder of the small bowel is unremarkable. There is scattered colonic diverticula without diverticulitis. The appendix is unremarkable. Vascular/Lymphatic: There are no enlarged mesenteric, retroperitoneal, or pelvic lymph nodes. Scattered aortic atherosclerotic calcifications are seen without aneurysmal dilatation. The intra-abdominal aorta measures up to 2.8 cm at the level of the origin of the celiac plexus. Reproductive: The prostate is heterogeneous and enlarged Other: No evidence of abdominal wall mass or hernia. Musculoskeletal: No acute or significant osseous findings. IMPRESSION: 1. Findings suggestive of extensive diffuse acute pancreatitis with inflammatory changes extending into the bilateral pericolic gutters. No evidence of pancreatic necrosis or surrounding pseudocyst. 2. Surrounding inflammatory changes around the duodenum. 3. Ectatic abdominal aorta at risk for aneurysm development at the level of the celiac  plexus. Recommend followup by ultrasound in 5 years. This recommendation follows ACR consensus guidelines: White Paper of the ACR Incidental Findings Committee II on Vascular Findings. J Am Coll Radiol 2013; 10:789-794. 4. Aortic aneurysm NOS (ICD10-I71.9) 5.  Aortic Atherosclerosis (ICD10-I70.0). Electronically Signed   By: Jonna Clark M.D.   On: 07/24/2019 02:37   US Abdomen Limited  Result Date: 07/24/2019 CLINICAL DATA:  Pancreatitis EXAM: ULTRASOUND ABDOMEN LIMITED RIGHT UPPER  QUADRANT COMPARISON:  CT same day FINDINGS: Gallbladder: Layering echogenic gallbladder sludge is present. No sonographic Eulah Pont sign is seen. The gallbladder wall is normal in appearance measuring 2 mm. Common bile duct: Diameter: 5.2 mm Liver: Increased echotexture seen throughout. No focal abnormality or biliary ductal dilatation. Portal vein is patent on color Doppler imaging with normal direction of blood flow towards the liver. Other: None. IMPRESSION: Gallbladder sludge.  No evidence of acute cholecystitis. Hepatic steatosis Electronically Signed   By: Jonna Clark M.D.   On: 07/24/2019 04:03        Scheduled Meds: . allopurinol  100 mg Oral BID  . amLODipine  5 mg Oral Daily  . aspirin EC  81 mg Oral Daily  . folic acid  1 mg Oral Daily  . latanoprost  1 drop Both Eyes QHS  . metoprolol tartrate  75 mg Oral BID  . multivitamin with minerals  1 tablet Oral Daily  . pantoprazole  40 mg Oral Daily  . thiamine  100 mg Oral Daily   Or  . thiamine  100 mg Intravenous Daily  . warfarin  5 mg Oral q1600  . Warfarin - Pharmacist Dosing Inpatient   Does not apply q1600   Continuous Infusions: . sodium chloride 150 mL/hr at 07/25/19 0400     LOS: 1 day    Time spent: 30 minutes    Rosetta Rupnow Hoover Brunette, DO [facility]  If 7PM-7AM, please contact night-coverage [email or web address] 07/25/2019, 8:48 AM

## 2019-07-26 LAB — CBC
HCT: 32.2 % — ABNORMAL LOW (ref 39.0–52.0)
Hemoglobin: 11.2 g/dL — ABNORMAL LOW (ref 13.0–17.0)
MCH: 31.8 pg (ref 26.0–34.0)
MCHC: 34.8 g/dL (ref 30.0–36.0)
MCV: 91.5 fL (ref 80.0–100.0)
Platelets: 180 10*3/uL (ref 150–400)
RBC: 3.52 MIL/uL — ABNORMAL LOW (ref 4.22–5.81)
RDW: 14.5 % (ref 11.5–15.5)
WBC: 12.8 10*3/uL — ABNORMAL HIGH (ref 4.0–10.5)
nRBC: 0 % (ref 0.0–0.2)

## 2019-07-26 LAB — COMPREHENSIVE METABOLIC PANEL
ALT: 140 U/L — ABNORMAL HIGH (ref 0–44)
AST: 124 U/L — ABNORMAL HIGH (ref 15–41)
Albumin: 2.8 g/dL — ABNORMAL LOW (ref 3.5–5.0)
Alkaline Phosphatase: 109 U/L (ref 38–126)
Anion gap: 7 (ref 5–15)
BUN: 9 mg/dL (ref 8–23)
CO2: 22 mmol/L (ref 22–32)
Calcium: 8.1 mg/dL — ABNORMAL LOW (ref 8.9–10.3)
Chloride: 101 mmol/L (ref 98–111)
Creatinine, Ser: 0.73 mg/dL (ref 0.61–1.24)
GFR calc Af Amer: >60 mL/min (ref >60)
GFR calc non Af Amer: >60 mL/min (ref >60)
Glucose, Bld: 121 mg/dL — ABNORMAL HIGH (ref 70–99)
Potassium: 3.6 mmol/L (ref 3.5–5.1)
Sodium: 130 mmol/L — ABNORMAL LOW (ref 135–145)
Total Bilirubin: 2.5 mg/dL — ABNORMAL HIGH (ref 0.3–1.2)
Total Protein: 5.5 g/dL — ABNORMAL LOW (ref 6.5–8.1)

## 2019-07-26 LAB — MAGNESIUM: Magnesium: 1.7 mg/dL (ref 1.7–2.4)

## 2019-07-26 LAB — PROTIME-INR
INR: 2.2 — ABNORMAL HIGH (ref 0.8–1.2)
Prothrombin Time: 24 seconds — ABNORMAL HIGH (ref 11.4–15.2)

## 2019-07-26 MED ORDER — WARFARIN SODIUM 5 MG PO TABS: 5.0000 mg | ORAL_TABLET | Freq: Every day | ORAL | 1 refills | Status: DC

## 2019-07-26 MED ORDER — CITALOPRAM HYDROBROMIDE 20 MG PO TABS
20.0000 mg | ORAL_TABLET | Freq: Every day | ORAL | 2 refills | Status: DC
Start: 2019-07-26 — End: 2021-11-15

## 2019-07-26 NOTE — Progress Notes (Signed)
PT Cancellation Note  Patient Details Name: Christian Crawford MRN: [id number] DOB: Aug 16, 1933   Cancelled Treatment:    Reason Eval/Treat Not Completed: Other (comment) Pt meeting with chaplain. Will follow up as pt available.  Farley Ly, PT, DPT  Acute Rehabilitation Services  Pager: [phone] Office: [phone]    [name] 07/26/2019, 11:07 AM

## 2019-07-26 NOTE — Discharge Summary (Signed)
Physician Discharge Summary [facility]    Patient: Christian Crawford                   Admit date: 07/23/2019   DOB: 22-Aug-1933             Discharge date:07/26/2019/12:09 PM [id number]:[id number]                          PCP: Rocco Serene, MD  Disposition: Home with home health  Recommendations for Outpatient Follow-up:   . Follow up: in 1 week  Discharge Condition: Stable   Code Status:   Code Status: Full Code  Diet recommendation: Cardiac diet   Discharge Diagnoses:    Principal Problem:   Acute alcoholic pancreatitis Active Problems:   Heavy alcohol use   HTN (hypertension)   A-fib ([name])   History of Present Illness/ Hospital Course Christian Crawford Summary:    Brief Narrative:  Per HPI: Christian Crawford a 86 y.o.malewith medical history significant ofA.Fib, TIA, on coumadin, HTN.  Pt drinks most days it sounds like. Presents to the ED due to abd pain, located across lower abdomen. Onset this afternoon after eating some ice cream and drinking "a double shot" of alcohol before the ice cream. Pain is severe, 10/10, now down to 8/10. No known h/o pancreatitis or gallstones in past.  5/1:Patient was admitted with extensive diffuse acute pancreatitis in the setting of alcohol use. He denies any abdominal pain, nausea or vomiting this morning. Repeat bolus of IV fluid and maintain on aggressive IV fluid. Start clear liquid diet since symptomatically improved. Discussed case with GI given transaminitis. We will continue to follow in a.m. and trend and will consider formal consultation if needed at that point. Abdominal ultrasound without any acute concerns at this time. May need to consider MRCP during this admission if trend is worsening.  5/2: Patient is having much less abdominal pain today with no nausea or vomiting noted.  He appears to be tolerating clear liquid diet and would like to have his diet advanced.  We will plan to start full liquids today and  continue aggressive IV fluid hydration.  LFTs are downward trending.  Anticipate discharge in the next 24-48 hours pending further decline in LFTs and if tolerating diet.  Continue aggressive IV fluid.  07/26/2019: Abdominal pain has improved, tolerating p.o., improving LFTs, Status post PT evaluation, home health has been arranged.  The patient has daughter was updated regarding specific follow-up with PCP, home health PT/OT, new medication of Celexa for his depression due losing his wife recently, was initiated. Recommended to follow-up with PCP with consultation to psych as an outpatient.  Detailed discharge summary  Acute alcoholic pancreatitis with associated transaminitis -Diet was advanced, tolerating -Monitor repeat LFT trended down, improved -Status post aggressive IV fluid -Counseled on alcohol cessation  Chronic, heavy alcohol use -No signs of withdrawal noted -CIWA protocol -Counseled on cessation  Hypertension-stable -Continue metoprolol and Norvasc -DC'd both Losartan and HCTZ  Atrial fibrillation-rate controlled -Continue metoprolol -Continue Coumadin for anticoagulation with therapeutic INR  Dyslipidemia -Improved LFTs, resume statins  Therapeutic on Coumadin Code Status: Full Family Communication:   Patient daughter was called and updated with all the details. Disposition Plan:  Discharging home with home health   Consultants:   None  Procedures:   None     Discharge Instructions:   Discharge Instructions    Activity as tolerated - No restrictions   Complete by: As  directed    Diet - low sodium heart healthy   Complete by: As directed    Discharge instructions   Complete by: As directed    Please follow-up with your PCP as soon as possible in 1 to 2 weeks.  New medication for depression Celexa was initiated.  You have to abstain from all alcohol use.  All measures need to be taken to prevent falls.  Home health has been arranged for PT  OT.  Please comply with your current medications.   Increase activity slowly   Complete by: As directed        Medication List    STOP taking these medications   Garlique 400 MG Tbec Generic drug: Garlic   hydrochlorothiazide 25 MG tablet Commonly known as: HYDRODIURIL   losartan 100 MG tablet Commonly known as: COZAAR     TAKE these medications   allopurinol 100 MG tablet Commonly known as: ZYLOPRIM Take 100 mg by mouth 2 (two) times daily.   amLODipine 5 MG tablet Commonly known as: NORVASC Take 5 mg by mouth daily.   aspirin EC 81 MG tablet Take 81 mg by mouth daily.   atorvastatin 40 MG tablet Commonly known as: LIPITOR Take 40 mg by mouth daily.   citalopram 20 MG tablet Commonly known as: CeleXA Take 1 tablet (20 mg total) by mouth daily.   latanoprost 0.005 % ophthalmic solution Commonly known as: XALATAN Place 1 drop into both eyes at bedtime.   Metoprolol Tartrate 75 MG Tabs Take 75 mg by mouth in the morning and at bedtime.   multivitamin with minerals tablet Take 1 tablet by mouth daily.   pantoprazole 40 MG tablet Commonly known as: PROTONIX Take 40 mg by mouth daily.   warfarin 5 MG tablet Commonly known as: COUMADIN Take 5 mg by mouth daily at 4 PM.       No Known Allergies   Procedures /Studies:   CT ABDOMEN PELVIS W CONTRAST  Result Date: 07/24/2019 CLINICAL DATA:  Mid abdominal pain EXAM: CT ABDOMEN AND PELVIS WITH CONTRAST TECHNIQUE: Multidetector CT imaging of the abdomen and pelvis was performed using the standard protocol following bolus administration of intravenous contrast. CONTRAST:  OMNIPAQUE IOHEXOL 300 MG/ML  SOLN COMPARISON:  None. FINDINGS: Lower chest: The visualized heart size within normal limits. No pericardial fluid/thickening. No hiatal hernia. Minimal basilar atelectasis is seen. Hepatobiliary: There is diffuse low density seen throughout the liver parenchyma.The main portal vein is patent. No evidence of  calcified gallstones, gallbladder wall thickening or biliary dilatation. Pancreas: There is significant fat stranding changes seen throughout the entirety of the pancreas. There is still normal pancreatic enhancement seen throughout. No pancreatic ductal dilatation. Probable vascular calcifications seen within the distal body of the pancreas. No surrounding pancreatic fluid collections or evidence of pancreatic necrosis. Fat stranding changes are seen extending along the retroperitoneum into the deep pelvis and bilateral pericolic gutters. Spleen: Normal in size without focal abnormality. Adrenals/Urinary Tract: Both adrenal glands appear normal. The kidneys and collecting system appear normal without evidence of urinary tract calculus or hydronephrosis. Bladder is unremarkable. Stomach/Bowel: There is a small hiatal hernia present. Fat stranding changes are seen surrounding the first and second portion of the duodenum. The remainder of the small bowel is unremarkable. There is scattered colonic diverticula without diverticulitis. The appendix is unremarkable. Vascular/Lymphatic: There are no enlarged mesenteric, retroperitoneal, or pelvic lymph nodes. Scattered aortic atherosclerotic calcifications are seen without aneurysmal dilatation. The intra-abdominal aorta measures up to  2.8 cm at the level of the origin of the celiac plexus. Reproductive: The prostate is heterogeneous and enlarged Other: No evidence of abdominal wall mass or hernia. Musculoskeletal: No acute or significant osseous findings. IMPRESSION: 1. Findings suggestive of extensive diffuse acute pancreatitis with inflammatory changes extending into the bilateral pericolic gutters. No evidence of pancreatic necrosis or surrounding pseudocyst. 2. Surrounding inflammatory changes around the duodenum. 3. 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 Recommend followup by ultrasound in 5 years. This  recommendation follows ACR consensus guidelines: White Paper of the ACR Incidental Findings Committee II on Vascular Findings. J Am Coll Radiol 2013; 10:789-794. 4. Aortic aneurysm NOS (ICD10-I71.9) 5.  Aortic Atherosclerosis (ICD10-I70.0). Electronically Signed   By: Jonna Clark M.D.   On: 07/24/2019 02:37   US Abdomen Limited  Result Date: 07/24/2019 CLINICAL DATA:  Pancreatitis EXAM: ULTRASOUND ABDOMEN LIMITED RIGHT UPPER QUADRANT COMPARISON:  CT same day FINDINGS: Gallbladder: Layering echogenic gallbladder sludge is present. No sonographic Eulah Pont sign is seen. The gallbladder wall is normal in appearance measuring 2 mm. Common bile duct: Diameter: 5.2 mm Liver: Increased echotexture seen throughout. No focal abnormality or biliary ductal dilatation. Portal vein is patent on color Doppler imaging with normal direction of blood flow towards the liver. Other: None. IMPRESSION: Gallbladder sludge.  No evidence of acute cholecystitis. Hepatic steatosis Electronically Signed   By: Jonna Clark M.D.   On: 07/24/2019 04:03     Subjective:   Patient was seen and examined 07/26/2019, 12:09 PM Patient stable today. No acute distress.  No issues overnight Stable for discharge.  Discharge Exam:    Vitals:   07/25/19 0740 07/25/19 1658 07/25/19 2106 07/26/19 0847  BP: 139/85 (!) 157/80 (!) 150/78 (!) 161/83  Pulse: 97 91 99 86  Resp: 18 18 18 20   Temp: 98.3 F (36.8 C) 98.4 F (36.9 C) 99.1 F (37.3 C) 98.4 F (36.9 C)  TempSrc:   Axillary   SpO2: 92% 96% 97% 96%  Weight:      Height:        General: Pt lying comfortably in bed & appears in no obvious distress. Cardiovascular: S1 & S2 heard, RRR, S1/S2 +. No murmurs, rubs, gallops or clicks. No JVD or pedal edema. Respiratory: Clear to auscultation without wheezing, rhonchi or crackles. No increased work of breathing. Abdominal:  Non-distended, non-tender & soft. No organomegaly or masses appreciated. Normal bowel sounds heard. CNS: Alert  and oriented. No focal deficits. Extremities: no edema, no cyanosis    The results of significant diagnostics from this hospitalization (including imaging, microbiology, ancillary and laboratory) are listed below for reference.      Microbiology:   Recent Results (from the past 240 hour(s))  Respiratory Panel by RT PCR (Flu A&B, Covid) - Urine, Clean Catch     Status: None   Collection Time: 07/24/19  4:35 AM   Specimen: Urine, Clean Catch  Result Value Ref Range Status   SARS Coronavirus 2 by RT PCR NEGATIVE NEGATIVE Final    Comment: (NOTE) SARS-CoV-2 target nucleic acids are NOT DETECTED. The SARS-CoV-2 RNA is generally detectable in upper respiratoy specimens during the acute phase of infection. The lowest concentration of SARS-CoV-2 viral copies this assay can detect is 131 copies/mL. A negative result does not preclude SARS-Cov-2 infection and should not be used as the sole basis for treatment or other patient management decisions. A negative result may occur with  improper specimen collection/handling, submission of  specimen other than nasopharyngeal swab, presence of viral mutation(s) within the areas targeted by this assay, and inadequate number of viral copies (<131 copies/mL). A negative result must be combined with clinical observations, patient history, and epidemiological information. The expected result is Negative. Fact Sheet for Patients:  https://www.moore.com/ Fact Sheet for Healthcare Providers:  https://www.young.biz/ This test is not yet ap proved or cleared by the [place] FDA and  has been authorized for detection and/or diagnosis of SARS-CoV-2 by FDA under an Emergency Use Authorization (EUA). This EUA will remain  in effect (meaning this test can be used) for the duration of the COVID-19 declaration under Section 564(b)(1) of the Act, 21 U.S.C. section 360bbb-3(b)(1), unless the authorization is terminated  or revoked sooner.    Influenza A by PCR NEGATIVE NEGATIVE Final   Influenza B by PCR NEGATIVE NEGATIVE Final    Comment: (NOTE) The Xpert Xpress SARS-CoV-2/FLU/RSV assay is intended as an aid in  the diagnosis of influenza from Nasopharyngeal swab specimens and  should not be used as a sole basis for treatment. Nasal washings and  aspirates are unacceptable for Xpert Xpress SARS-CoV-2/FLU/RSV  testing. Fact Sheet for Patients: https://www.moore.com/ Fact Sheet for Healthcare Providers: https://www.young.biz/ This test is not yet approved or cleared by the [place] FDA and  has been authorized for detection and/or diagnosis of SARS-CoV-2 by  FDA under an Emergency Use Authorization (EUA). This EUA will remain  in effect (meaning this test can be used) for the duration of the  Covid-19 declaration under Section 564(b)(1) of the Act, 21  U.S.C. section 360bbb-3(b)(1), unless the authorization is  terminated or revoked. Performed at [facility], [address] [place], [place] [address]      Labs:   CBC: Recent Labs  Lab 07/23/19 2134 07/24/19 0622 07/25/19 0513 07/26/19 0359  WBC 12.1* 8.9 12.4* 12.8*  HGB 13.6 13.3 12.0* 11.2*  HCT 38.9* 39.0 34.1* 32.2*  MCV 89.2 91.1 90.0 91.5  PLT 298 223 200 180   Basic Metabolic Panel: Recent Labs  Lab 07/23/19 2134 07/24/19 0622 07/25/19 0513 07/26/19 0359  NA 127* 129* 132* 130*  K 3.7 4.1 3.7 3.6  CL 93* 94* 99 101  CO2 22 24 24 22   GLUCOSE 146* 141* 117* 121*  BUN 7* 11 10 9   CREATININE 0.96 1.09 0.89 0.73  CALCIUM 9.1 8.6* 8.4* 8.1*  MG  --  1.6* 1.9 1.7  PHOS  --  3.6  --   --    Liver Function Tests: Recent Labs  Lab 07/23/19 2134 07/24/19 0622 07/25/19 0513 07/26/19 0359  AST 64* 304* 156* 124*  ALT 45* 123* 120* 140*  ALKPHOS 64 95 92 109  BILITOT 1.3* 4.2* 4.0* 2.5*  PROT 6.6 6.3* 5.7* 5.5*  ALBUMIN 3.9 3.7 3.0* 2.8*   BNP (last 3 results) No  results for input(s): BNP in the last 8760 hours. Cardiac Enzymes: No results for input(s): CKTOTAL, CKMB, CKMBINDEX, TROPONINI in the last 168 hours. CBG: No results for input(s): GLUCAP in the last 168 hours. Hgb A1c No results for input(s): HGBA1C in the last 72 hours. Lipid Profile No results for input(s): CHOL, HDL, LDLCALC, TRIG, CHOLHDL, LDLDIRECT in the last 72 hours. Thyroid function studies No results for input(s): TSH, T4TOTAL, T3FREE, THYROIDAB in the last 72 hours.  Invalid input(s): FREET3 Anemia work up No results for input(s): VITAMINB12, FOLATE, FERRITIN, TIBC, IRON, RETICCTPCT in the last 72 hours. Urinalysis    Component Value Date/Time  COLORURINE YELLOW 07/24/2019 0435   APPEARANCEUR CLEAR 07/24/2019 0435   LABSPEC 1.036 (H) 07/24/2019 0435   PHURINE 7.0 07/24/2019 0435   GLUCOSEU NEGATIVE 07/24/2019 0435   HGBUR NEGATIVE 07/24/2019 0435   BILIRUBINUR NEGATIVE 07/24/2019 0435   KETONESUR NEGATIVE 07/24/2019 0435   PROTEINUR NEGATIVE 07/24/2019 0435   NITRITE NEGATIVE 07/24/2019 0435   LEUKOCYTESUR NEGATIVE 07/24/2019 0435         Time coordinating discharge: Over 45 minutes  SIGNED: Kendell BaneSeyed A Enza Shone, MD, FACP, FHM. [facility],  Please use [email or web address] to Page If 7PM-7AM, please contact night-coverage [email or web address], Password [email or web address] 07/26/2019, 12:09 PM

## 2019-07-26 NOTE — Progress Notes (Signed)
The chaplain visited with the patient as a result of a consult from the nurse. The patient spoke about moving to the area after his wife died. The patient expressed some loneliness at sitting at home alone and how that led to drinking. The chaplain offered support through conversation and gave the patient AD paperwork. The chaplain is available for follow-up when the patient has discussed the paperwork with his daughter, or if the patient requests a visit.  Lavone Neri Chaplain Resident For questions concerning this note please contact me by pager [phone]

## 2019-07-26 NOTE — Progress Notes (Signed)
ANTICOAGULATION CONSULT NOTE  Pharmacy Consult for Coumadin Indication: atrial fibrillation  No Known Allergies  Patient Measurements: Height: 6\' 1"  (185.4 cm) Weight: 100 kg (220 lb 7.4 oz) IBW/kg (Calculated) : 79.9  Vital Signs: Temp: 98.4 F (36.9 C) (05/03 0847) BP: 161/83 (05/03 0847) Pulse Rate: 86 (05/03 0847)  Labs: Recent Labs    07/24/19 0622 07/24/19 0622 07/25/19 0513 07/26/19 0359  HGB 13.3   < > 12.0* 11.2*  HCT 39.0  --  34.1* 32.2*  PLT 223  --  200 180  LABPROT 28.3*  --  27.3* 24.0*  INR 2.8*  --  2.6* 2.2*  CREATININE 1.09  --  0.89 0.73   < > = values in this interval not displayed.    Estimated Creatinine Clearance: 85.5 mL/min (by C-G formula based on SCr of 0.73 mg/dL).   Medical History: Past Medical History:  Diagnosis Date  . A-fib ([facility])   . Hypertension   . TIA (transient ischemic attack)     Assessment: 86 y.o. M presents with abd pain. Pt on warfarin PTA for afib. Admission INR 2.8.   INR is therapeutic at 2.2. Hgb 11.2, plt 180. No s/sx of bleeding documented.   Home dose: Coumadin 5mg  po daily  Goal of Therapy:  INR 2-3 Monitor platelets by anticoagulation protocol: Yes   Plan:  Coumadin 5mg  po daily Daily INR, CBC, and for s/sx of bleeding    97, PharmD, BCPS, BCCCP Clinical Pharmacist 07/26/2019 9:15 AM  Please check AMION for all [facility] Pharmacy phone numbers After 10:00 PM, call Main Pharmacy [phone]

## 2019-07-26 NOTE — TOC Transition Note (Signed)
Transition of Care [facility]) - CM/SW Discharge Note   Patient Details  Name: Christian Crawford MRN: [id number] Date of Birth: May 31, 1933  [facility] CM/SW Contact:  Beckie Busing, RN Phone Number: 07/26/2019, 12:56 PM   Clinical Narrative:    Patient to discharge home with daughter. HH set up through [facility] per patient and daughters request. No further needs noted at this time. Info has been added to avs. CM will sign off    Final next level of care: Home w Home Health Services Barriers to Discharge: No Barriers Identified   Patient Goals and CMS Choice Patient states their goals for this hospitalization and ongoing recovery are:: To go home CMS Medicare.gov Compare Post Acute Care list provided to:: Patient Represenative (must comment)(patient and daughter) Choice offered to / list presented to : Patient, Adult Children  Discharge Placement                       Discharge Plan and Services In-house Referral: NA Discharge Planning Services: CM Consult Post Acute Care Choice: Home Health          DME Arranged: N/A DME Agency: NA       HH Arranged: PT, OT, Nurse's Aide HH Agency: [facility] Date HH Agency Contacted: 07/26/19 Time HH Agency Contacted: 1256 Representative spoke with at [facility] Agency: Lupita Leash  Social Determinants of Health (SDOH) Interventions     Readmission Risk Interventions No flowsheet data found.

## 2019-07-26 NOTE — Evaluation (Signed)
Physical Therapy Evaluation Patient Details Name: Christian Crawford MRN: [id number] DOB: [birth year] Today's Date: 07/26/2019   History of Present Illness  Pt is an 86 y/o male admitted secondary to worsening abdominal pain. Found to have acute alcoholic pancreatitis. PMH includes HTN, a fib, and TIA.   Clinical Impression  Pt admitted secondary to problem above with deficits below. Pt requiring min guard A for mobility with use of RW. Reports some weakness and fatigue, however, tolerated ambulation fairly well. Feel pt would benefit from HHPT follow up at d/c. Will continue to follow acutely to maximize functional mobility independence and safety.     Follow Up Recommendations Home health PT    Equipment Recommendations  None recommended by PT    Recommendations for Other Services       Precautions / Restrictions Precautions Precautions: Fall Restrictions Weight Bearing Restrictions: No      Mobility  Bed Mobility Overal bed mobility: Needs Assistance Bed Mobility: Supine to Sit;Sit to Supine     Supine to sit: Supervision Sit to supine: Supervision   General bed mobility comments: Supervision for safety and line management. Increased time required to perform.   Transfers Overall transfer level: Needs assistance Equipment used: Rolling walker (2 wheeled) Transfers: Sit to/from Stand Sit to Stand: Min guard;From elevated surface         General transfer comment: Required 2 attempts to stand and used momentum. Demonstrated safe hand placement. Min guard for steadying assist.   Ambulation/Gait Ambulation/Gait assistance: Min guard Gait Distance (Feet): 100 Feet Assistive device: Rolling walker (2 wheeled) Gait Pattern/deviations: Step-through pattern;Decreased stride length Gait velocity: decreased   General Gait Details: Slow, cautious gait. Reports some weakness and fatigue, but tolerated gait well. Educated about using RW at home to increase safety.   Stairs             Wheelchair Mobility    Modified Rankin (Stroke Patients Only)       Balance Overall balance assessment: Needs assistance Sitting-balance support: No upper extremity supported;Feet supported Sitting balance-Leahy Scale: Good     Standing balance support: Bilateral upper extremity supported;During functional activity Standing balance-Leahy Scale: Poor Standing balance comment: Reliant on BUE support                              Pertinent Vitals/Pain Pain Assessment: Faces Faces Pain Scale: Hurts a little bit Pain Location: abdomen Pain Descriptors / Indicators: Aching Pain Intervention(s): Limited activity within patient's tolerance;Monitored during session;Repositioned    Home Living Family/patient expects to be discharged to:: Private residence Living Arrangements: Children Available Help at Discharge: Family;Available PRN/intermittently Type of Home: House Home Access: Stairs to enter Entrance Stairs-Rails: Doctor, general practice of Steps: 4 Home Layout: One level Home Equipment: Walker - 2 wheels;Shower seat      Prior Function Level of Independence: Independent               Hand Dominance        Extremity/Trunk Assessment   Upper Extremity Assessment Upper Extremity Assessment: Overall WFL for tasks assessed    Lower Extremity Assessment Lower Extremity Assessment: Generalized weakness    Cervical / Trunk Assessment Cervical / Trunk Assessment: Kyphotic  Communication   Communication: No difficulties  Cognition Arousal/Alertness: Awake/alert Behavior During Therapy: WFL for tasks assessed/performed Overall Cognitive Status: Within Functional Limits for tasks assessed  General Comments      Exercises     Assessment/Plan    PT Assessment Patient needs continued PT services  PT Problem List Decreased strength;Decreased balance;Decreased  mobility;Decreased activity tolerance       PT Treatment Interventions Gait training;DME instruction;Functional mobility training;Stair training;Therapeutic activities;Therapeutic exercise;Balance training;Patient/family education    PT Goals (Current goals can be found in the Care Plan section)  Acute Rehab PT Goals Patient Stated Goal: to go home PT Goal Formulation: With patient Time For Goal Achievement: 08/09/19 Potential to Achieve Goals: Good    Frequency Min 3X/week   Barriers to discharge        Co-evaluation               AM-PAC PT "6 Clicks" Mobility  Outcome Measure Help needed turning from your back to your side while in a flat bed without using bedrails?: None Help needed moving from lying on your back to sitting on the side of a flat bed without using bedrails?: None Help needed moving to and from a bed to a chair (including a wheelchair)?: A Little Help needed standing up from a chair using your arms (e.g., wheelchair or bedside chair)?: A Little Help needed to walk in hospital room?: A Little Help needed climbing 3-5 steps with a railing? : A Lot 6 Click Score: 19    End of Session Equipment Utilized During Treatment: Gait belt Activity Tolerance: Patient tolerated treatment well Patient left: in bed;with call bell/phone within reach;with bed alarm set Nurse Communication: Mobility status PT Visit Diagnosis: Unsteadiness on feet (R26.81);Muscle weakness (generalized) (M62.81)    Time: 0600-4599 PT Time Calculation (min) (ACUTE ONLY): 19 min   Charges:   PT Evaluation $PT Eval Moderate Complexity: 1 Mod          Reuel Derby, PT, DPT  Acute Rehabilitation Services  Pager: [phone] Office: [phone]   Rudean Hitt 07/26/2019, 11:43 AM

## 2019-07-30 ENCOUNTER — Emergency Department (HOSPITAL_COMMUNITY)
Admission: EM | Admit: 2019-07-30 | Discharge: 2019-07-30 | Disposition: A | Discharge status: Home / Self Care | Payer: Medicare Other | Attending: Emergency Medicine | Admitting: Emergency Medicine

## 2019-07-30 ENCOUNTER — Emergency Department (HOSPITAL_COMMUNITY): Payer: Medicare Other

## 2019-07-30 ENCOUNTER — Other Ambulatory Visit: Payer: Self-pay

## 2019-07-30 ENCOUNTER — Encounter (HOSPITAL_COMMUNITY): Payer: Self-pay

## 2019-07-30 DIAGNOSIS — Z7982 Long term (current) use of aspirin: Secondary | ICD-10-CM | POA: Insufficient documentation

## 2019-07-30 DIAGNOSIS — Z87891 Personal history of nicotine dependence: Secondary | ICD-10-CM | POA: Diagnosis not present

## 2019-07-30 DIAGNOSIS — I10 Essential (primary) hypertension: Secondary | ICD-10-CM | POA: Diagnosis not present

## 2019-07-30 DIAGNOSIS — Z79899 Other long term (current) drug therapy: Secondary | ICD-10-CM | POA: Insufficient documentation

## 2019-07-30 DIAGNOSIS — R55 Syncope and collapse: Secondary | ICD-10-CM | POA: Diagnosis present

## 2019-07-30 DIAGNOSIS — R110 Nausea: Secondary | ICD-10-CM | POA: Insufficient documentation

## 2019-07-30 LAB — COMPREHENSIVE METABOLIC PANEL
ALT: 44 U/L (ref 0–44)
AST: 27 U/L (ref 15–41)
Albumin: 3.2 g/dL — ABNORMAL LOW (ref 3.5–5.0)
Alkaline Phosphatase: 89 U/L (ref 38–126)
Anion gap: 11 (ref 5–15)
BUN: 14 mg/dL (ref 8–23)
CO2: 21 mmol/L — ABNORMAL LOW (ref 22–32)
Calcium: 8.5 mg/dL — ABNORMAL LOW (ref 8.9–10.3)
Chloride: 100 mmol/L (ref 98–111)
Creatinine, Ser: 1.06 mg/dL (ref 0.61–1.24)
GFR calc Af Amer: >60 mL/min (ref >60)
GFR calc non Af Amer: >60 mL/min (ref >60)
Glucose, Bld: 166 mg/dL — ABNORMAL HIGH (ref 70–99)
Potassium: 3.3 mmol/L — ABNORMAL LOW (ref 3.5–5.1)
Sodium: 132 mmol/L — ABNORMAL LOW (ref 135–145)
Total Bilirubin: 1 mg/dL (ref 0.3–1.2)
Total Protein: 6.2 g/dL — ABNORMAL LOW (ref 6.5–8.1)

## 2019-07-30 LAB — CBC WITH DIFFERENTIAL/PLATELET
Abs Immature Granulocytes: 0.09 10*3/uL — ABNORMAL HIGH (ref 0.00–0.07)
Basophils Absolute: 0 10*3/uL (ref 0.0–0.1)
Basophils Relative: 0 %
Eosinophils Absolute: 0.1 10*3/uL (ref 0.0–0.5)
Eosinophils Relative: 1 %
HCT: 29.7 % — ABNORMAL LOW (ref 39.0–52.0)
Hemoglobin: 10 g/dL — ABNORMAL LOW (ref 13.0–17.0)
Immature Granulocytes: 1 %
Lymphocytes Relative: 19 %
Lymphs Abs: 1.9 10*3/uL (ref 0.7–4.0)
MCH: 31.6 pg (ref 26.0–34.0)
MCHC: 33.7 g/dL (ref 30.0–36.0)
MCV: 94 fL (ref 80.0–100.0)
Monocytes Absolute: 1.3 10*3/uL — ABNORMAL HIGH (ref 0.1–1.0)
Monocytes Relative: 13 %
Neutro Abs: 6.4 10*3/uL (ref 1.7–7.7)
Neutrophils Relative %: 66 %
Platelets: 328 10*3/uL (ref 150–400)
RBC: 3.16 MIL/uL — ABNORMAL LOW (ref 4.22–5.81)
RDW: 13.9 % (ref 11.5–15.5)
WBC: 9.8 10*3/uL (ref 4.0–10.5)
nRBC: 0 % (ref 0.0–0.2)

## 2019-07-30 LAB — CBG MONITORING, ED: Glucose-Capillary: 152 mg/dL — ABNORMAL HIGH (ref 70–99)

## 2019-07-30 LAB — PROTIME-INR
INR: 1.9 — ABNORMAL HIGH (ref 0.8–1.2)
Prothrombin Time: 21.5 seconds — ABNORMAL HIGH (ref 11.4–15.2)

## 2019-07-30 LAB — ETHANOL: Alcohol, Ethyl (B): <10 mg/dL (ref <10)

## 2019-07-30 MED ORDER — SODIUM CHLORIDE 0.9 % IV BOLUS
500.0000 mL | Freq: Once | INTRAVENOUS | Status: AC
  Administered 2019-07-30: 500 mL via INTRAVENOUS

## 2019-07-30 MED ORDER — SODIUM CHLORIDE 0.9 % IV SOLN: INTRAVENOUS | Status: DC

## 2019-07-30 NOTE — Discharge Instructions (Addendum)
As discussed, your evaluation today has been largely reassuring.  But, it is important that you monitor your condition carefully, and do not hesitate to return to the ED if you develop new, or concerning changes in your condition. ? ?Otherwise, please follow-up with your physician for appropriate ongoing care. ? ?

## 2019-07-30 NOTE — ED Provider Notes (Signed)
[facility] EMERGENCY DEPARTMENT Provider Note   CSN: [id number] Arrival date & time: 07/30/19  1131     History Chief Complaint  Patient presents with  . Near Syncope    Christian Crawford is a 86 y.o. male.  HPI    Patient presents from his physician's office after an episode of lightheadedness. Patient self states that he currently feels fine, has no complaints including pain, lightheadedness, near syncope, nausea. Acknowledges multiple medical issues, states that he takes his medication as directed. Today he went to his physician's office, and in preparation for blood work did not eat breakfast. He notes that about the time of blood draw he felt lightheaded, did not fall, did not have syncope.  On there are some associated nausea, but no vomiting, no diarrhea. With concern for this episode he was sent here for evaluation.  Past Medical History:  Diagnosis Date  . A-fib (HCC)   . Hypertension   . TIA (transient ischemic attack)     Patient Active Problem List   Diagnosis Date Noted  . Heavy alcohol use 07/24/2019  . Acute alcoholic pancreatitis 07/24/2019  . HTN (hypertension) 07/24/2019  . A-fib (HCC) 07/24/2019    Past Surgical History:  Procedure Laterality Date  . EYE SURGERY     had "droopy eye"       History reviewed. No pertinent family history.  Social History   Tobacco Use  . Smoking status: Former Smoker    Packs/day: 2.50    Years: 40.00    Pack years: 100.00    Types: Cigarettes  . Smokeless tobacco: Never Used  . Tobacco comment: Pt stated has not smoke in 27 years  Substance Use Topics  . Alcohol use: Yes    Comment: 1/4 pint a day-liquor.  . Drug use: Never    Home Medications Prior to Admission medications   Medication Sig Start Date End Date Taking? Authorizing Provider  allopurinol (ZYLOPRIM) 100 MG tablet Take 100 mg by mouth 2 (two) times daily.   Yes [provider]  amLODipine (NORVASC) 5 MG tablet  Take 5 mg by mouth daily.   Yes [provider]  aspirin EC 81 MG tablet Take 81 mg by mouth daily.   Yes [provider]  atorvastatin (LIPITOR) 40 MG tablet Take 40 mg by mouth daily.   Yes [provider]  citalopram (CELEXA) 20 MG tablet Take 1 tablet (20 mg total) by mouth daily. 07/26/19 07/25/20 Yes Shahmehdi, Seyed A, MD  latanoprost (XALATAN) 0.005 % ophthalmic solution Place 1 drop into both eyes at bedtime. 06/10/19  Yes [provider]  Metoprolol Tartrate 75 MG TABS Take 75 mg by mouth in the morning and at bedtime.   Yes [provider]  Multiple Vitamins-Minerals (MULTIVITAMIN WITH MINERALS) tablet Take 1 tablet by mouth daily.   Yes [provider]  pantoprazole (PROTONIX) 40 MG tablet Take 40 mg by mouth daily.   Yes [provider]  warfarin (COUMADIN) 5 MG tablet Take 1 tablet (5 mg total) by mouth daily at 4 PM. 07/26/19  Yes Shahmehdi, Gemma Payor, MD    Allergies    Patient has no known allergies.  Review of Systems   Review of Systems  Constitutional:       Per HPI, otherwise negative  HENT:       Per HPI, otherwise negative  Respiratory:       Per HPI, otherwise negative  Cardiovascular:  Per HPI, otherwise negative  Gastrointestinal: Positive for nausea. Negative for vomiting.  Endocrine:       Negative aside from HPI  Genitourinary:       Neg aside from HPI   Musculoskeletal:       Per HPI, otherwise negative  Skin: Negative.   Neurological: Positive for light-headedness. Negative for syncope.    Physical Exam Updated Vital Signs BP (!) 164/84   Pulse 63   Temp 97.7 F (36.5 C) (Oral)   Resp 13   Ht 6\' 1"  (1.854 m)   Wt 91.2 kg   SpO2 97%   BMI 26.52 kg/m   Physical Exam Vitals and nursing note reviewed.  Constitutional:      General: He is not in acute distress.    Appearance: He is well-developed.  HENT:     Head: Normocephalic and atraumatic.  Eyes:     Conjunctiva/sclera:  Conjunctivae normal.  Cardiovascular:     Rate and Rhythm: Normal rate and regular rhythm.  Pulmonary:     Effort: Pulmonary effort is normal. No respiratory distress.     Breath sounds: No stridor.  Abdominal:     General: There is no distension.  Skin:    General: Skin is warm and dry.  Neurological:     Mental Status: He is alert and oriented to person, place, and time.     ED Results / Procedures / Treatments   Labs (all labs ordered are listed, but only abnormal results are displayed) Labs Reviewed  COMPREHENSIVE METABOLIC PANEL - Abnormal; Notable for the following components:      Result Value   Sodium 132 (*)    Potassium 3.3 (*)    CO2 21 (*)    Glucose, Bld 166 (*)    Calcium 8.5 (*)    Total Protein 6.2 (*)    Albumin 3.2 (*)    All other components within normal limits  CBC WITH DIFFERENTIAL/PLATELET - Abnormal; Notable for the following components:   RBC 3.16 (*)    Hemoglobin 10.0 (*)    HCT 29.7 (*)    Monocytes Absolute 1.3 (*)    Abs Immature Granulocytes 0.09 (*)    All other components within normal limits  PROTIME-INR - Abnormal; Notable for the following components:   Prothrombin Time 21.5 (*)    INR 1.9 (*)    All other components within normal limits  CBG MONITORING, ED - Abnormal; Notable for the following components:   Glucose-Capillary 152 (*)    All other components within normal limits  ETHANOL    EKG EKG Interpretation  Date/Time:  Friday Jul 30 2019 11:37:40 EDT Ventricular Rate:  68 PR Interval:    QRS Duration: 78 QT Interval:  423 QTC Calculation: 450 R Axis:   33 Text Interpretation: Sinus rhythm Borderline T wave abnormalities Artifact Abnormal ECG Confirmed by Carmin Muskrat [phone]) on 07/30/2019 11:46:19 AM   Radiology DG Chest Port 1 View  Result Date: 07/30/2019 CLINICAL DATA:  Vagal response. EXAM: PORTABLE CHEST 1 VIEW COMPARISON:  None. FINDINGS: The heart size and pulmonary vascularity are normal and the lungs are  clear. No appreciable effusions. No significant bone abnormality. IMPRESSION: No active disease. Aortic Atherosclerosis (ICD10-I70.0). Electronically Signed   By: Lorriane Shire M.D.   On: 07/30/2019 12:13    Procedures Procedures (including critical care time)  Medications Ordered in ED Medications  sodium chloride 0.9 % bolus 500 mL (500 mLs Intravenous Bolus from Bag 07/30/19 1239)  ED Course  I have reviewed the triage vital signs and the nursing notes.  Pertinent labs & imaging results that were available during my care of the patient were reviewed by me and considered in my medical decision making (see chart for details).  1:21 PM Patient accompanied by his daughter.  She states that he seems about the same as normal. Together we discussed the findings, labs reassuring, there is mild hyperglycemia, mild hyponatremia and hypokalemia, but no substantial abnormalities, EKG is nonischemic, with no sustained arrhythmia, x-ray does not demonstrate pneumonia, or other infection or substantial abnormalities.  Absent any ongoing complaints, no hemodynamic instability, reassuring findings as above, there are some suspicion for his your syncope being related to lack of food intake, possibly exacerbated by vagal episode during blood draw.  With these reassuring findings, and after discussion on return precautions and follow-up instructions, patient was discharged in stable condition. Final Clinical Impression(s) / ED Diagnoses Final diagnoses:  Near syncope     Gerhard Munch, MD 07/30/19 1322

## 2019-07-30 NOTE — ED Triage Notes (Signed)
Pt arrived to ED via [facility] from PCP office d/t a vagal response that resulted while at his post-op appointment since D/C from hospital with acute pancreatitis. For the past 2 days pt c/o increased weakness & dark/tarry stools. VSS: 120/80, 72 bpm, 99% on RA, CBG 204. Upon arrival to ED pt is A/oX4, verbal-able to make needs known.

## 2020-08-11 ENCOUNTER — Other Ambulatory Visit: Payer: Self-pay | Admitting: Family Medicine

## 2020-08-11 ENCOUNTER — Other Ambulatory Visit (HOSPITAL_COMMUNITY): Payer: Self-pay | Admitting: Family Medicine

## 2020-08-11 DIAGNOSIS — K760 Fatty (change of) liver, not elsewhere classified: Secondary | ICD-10-CM

## 2020-08-22 ENCOUNTER — Ambulatory Visit (HOSPITAL_COMMUNITY): Payer: PRIVATE HEALTH INSURANCE

## 2020-08-22 ENCOUNTER — Ambulatory Visit (HOSPITAL_COMMUNITY)
Admission: RE | Admit: 2020-08-22 | Discharge: 2020-08-22 | Disposition: A | Discharge status: Home / Self Care | Payer: Medicare Other | Source: Ambulatory Visit | Attending: Family Medicine | Admitting: Family Medicine

## 2020-08-22 ENCOUNTER — Encounter (HOSPITAL_COMMUNITY): Payer: Self-pay

## 2020-08-22 ENCOUNTER — Other Ambulatory Visit: Payer: Self-pay

## 2020-08-22 DIAGNOSIS — K760 Fatty (change of) liver, not elsewhere classified: Secondary | ICD-10-CM | POA: Insufficient documentation

## 2021-01-08 ENCOUNTER — Ambulatory Visit
Admission: RE | Admit: 2021-01-08 | Discharge: 2021-01-08 | Disposition: A | Discharge status: Home / Self Care | Payer: Medicare Other | Source: Ambulatory Visit | Attending: Family Medicine | Admitting: Family Medicine

## 2021-01-08 ENCOUNTER — Other Ambulatory Visit: Payer: Self-pay

## 2021-01-08 ENCOUNTER — Other Ambulatory Visit: Payer: Self-pay | Admitting: Family Medicine

## 2021-01-08 DIAGNOSIS — M542 Cervicalgia: Secondary | ICD-10-CM

## 2021-01-18 ENCOUNTER — Other Ambulatory Visit: Payer: Self-pay | Admitting: Family Medicine

## 2021-01-18 DIAGNOSIS — R0989 Other specified symptoms and signs involving the circulatory and respiratory systems: Secondary | ICD-10-CM

## 2021-01-23 ENCOUNTER — Ambulatory Visit
Admission: RE | Admit: 2021-01-23 | Discharge: 2021-01-23 | Disposition: A | Discharge status: Home / Self Care | Payer: Medicare Other | Source: Ambulatory Visit | Attending: Family Medicine | Admitting: Family Medicine

## 2021-01-23 DIAGNOSIS — R0989 Other specified symptoms and signs involving the circulatory and respiratory systems: Secondary | ICD-10-CM

## 2021-06-13 IMAGING — CT CT ABD-PELV W/ CM
2 of 5 series · 16 of 46 positions shown, 18 images · IV contrast (Omni 300)
Comparison: None.

CLINICAL DATA: Mid abdominal pain

EXAM:
CT ABDOMEN AND PELVIS WITH CONTRAST
TECHNIQUE: Multidetector CT imaging of the abdomen and pelvis was performed
using the standard protocol following bolus administration of
intravenous contrast.
CONTRAST:  100mL OMNIPAQUE IOHEXOL 300 MG/ML  SOLN

[Series 3: a/p w/ 5mm · axial · 0.98mm/px · z∈[+906,+1366]mm · 13 of 104 slices shown, 15 images]
[im 6/104  soft-tissue]
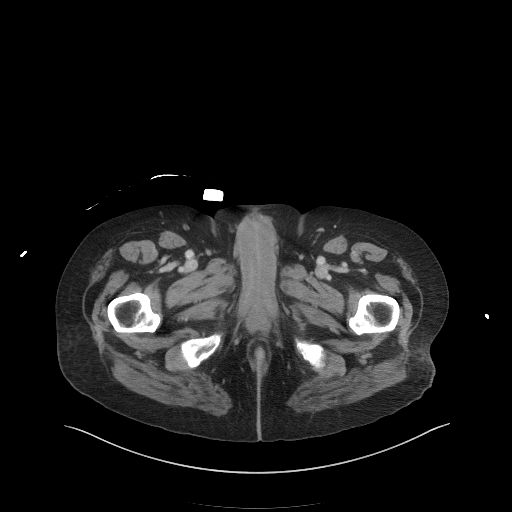
[im 6/104  bone]
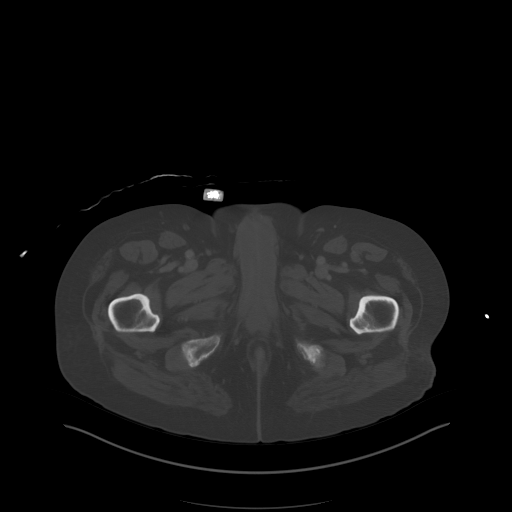
[im 17/104  soft-tissue]
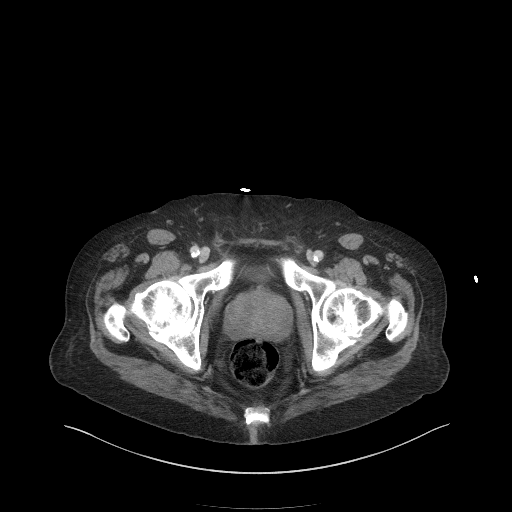
[im 22/104  soft-tissue]
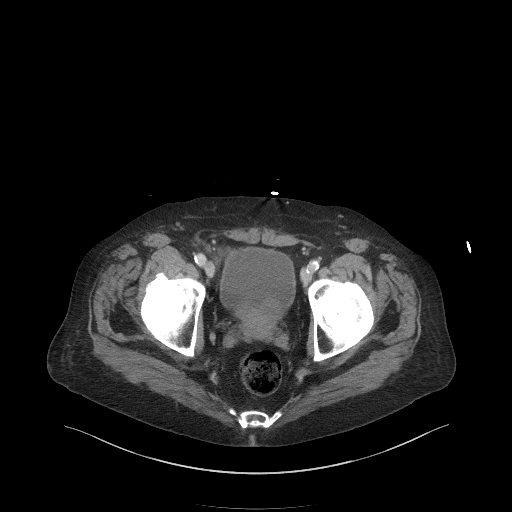
[im 28/104  soft-tissue]
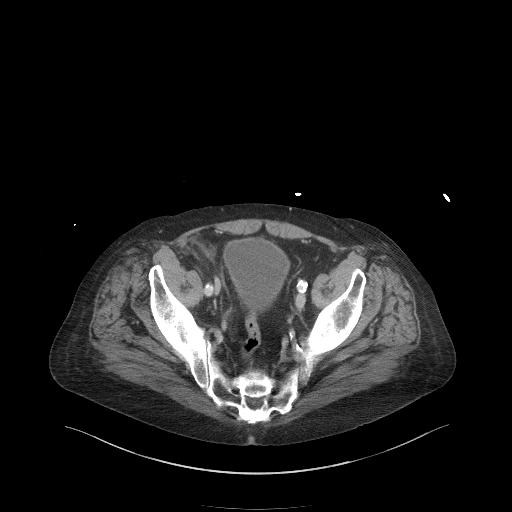
[im 38/104  soft-tissue]
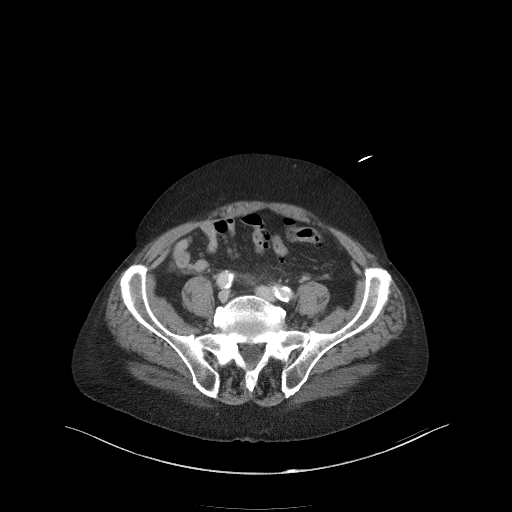
[im 44/104  soft-tissue]
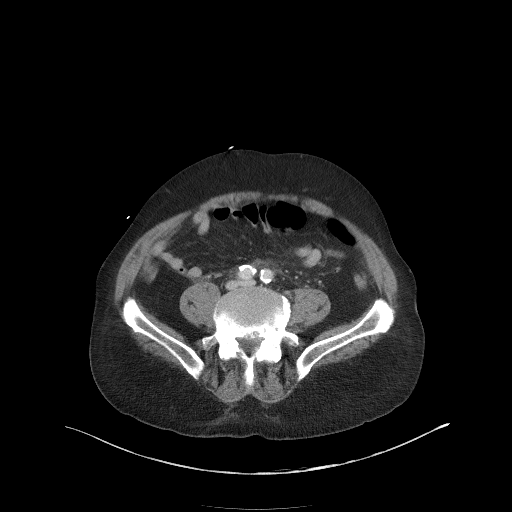
[im 55/104  soft-tissue]
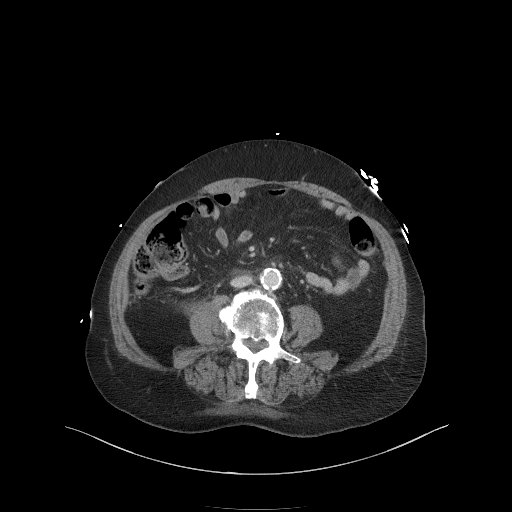
[im 60/104  soft-tissue]
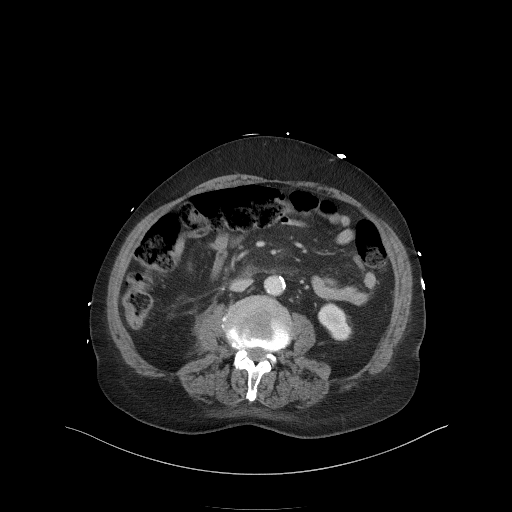
[im 66/104  soft-tissue]
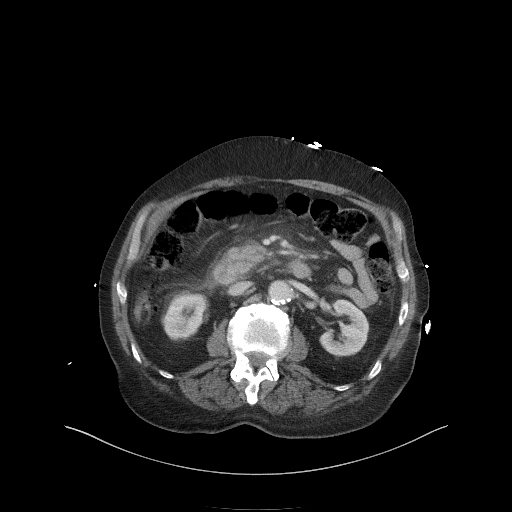
[im 66/104  bone]
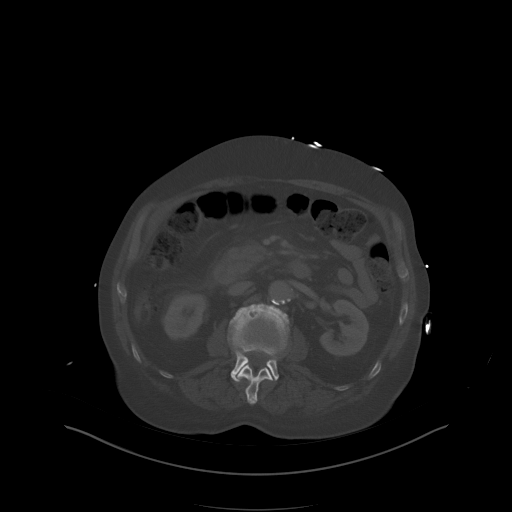
[im 76/104  soft-tissue]
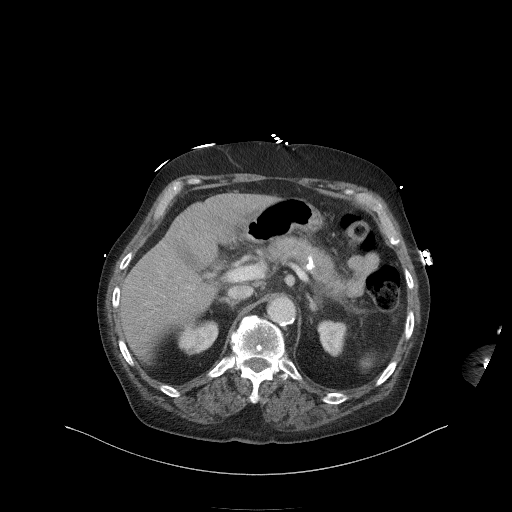
[im 82/104  soft-tissue]
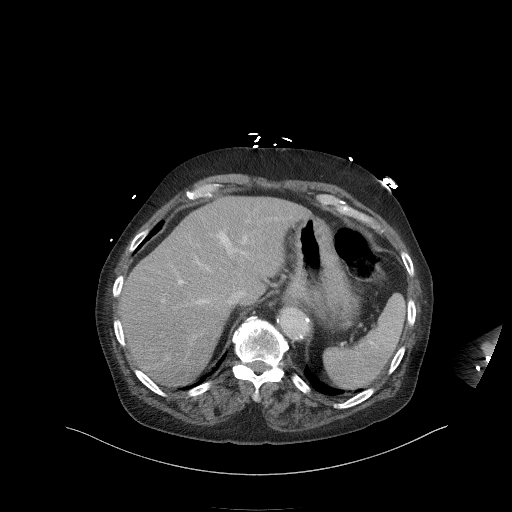
[im 87/104  soft-tissue]
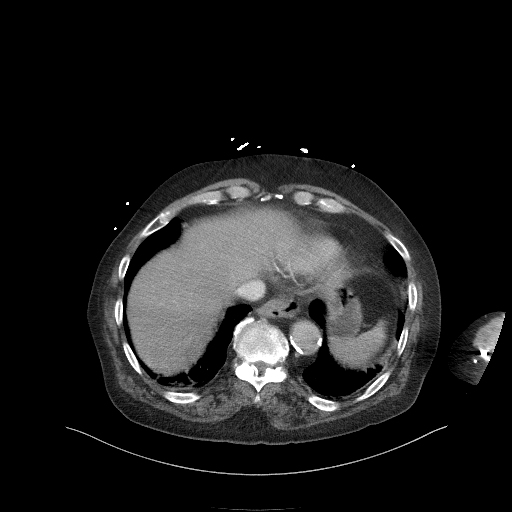
[im 98/104  soft-tissue]
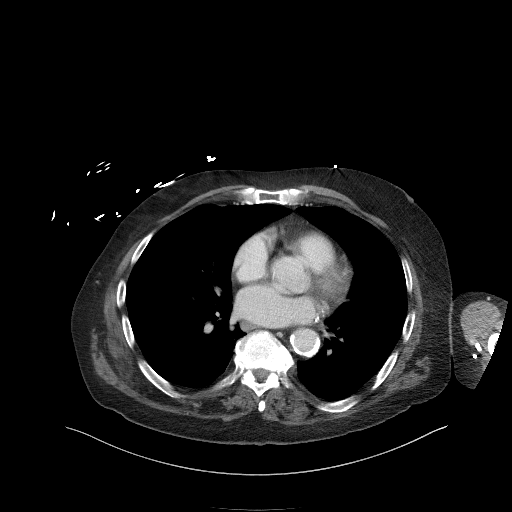

[Series 6: a/p w/ cor · coronal · 0.98mm/px · 3 of 153 slices shown]
[im 51/153  soft-tissue]
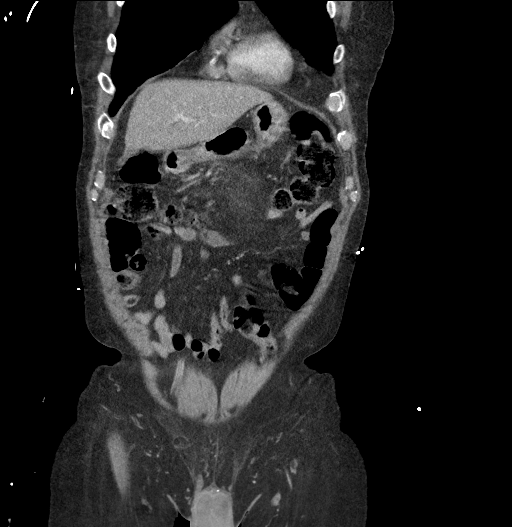
[im 68/153  soft-tissue]
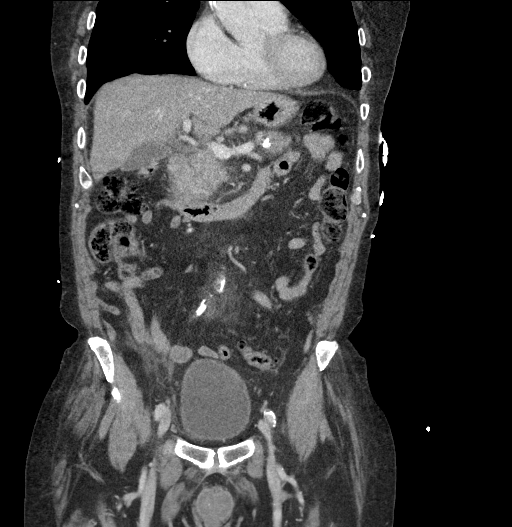
[im 85/153  soft-tissue]
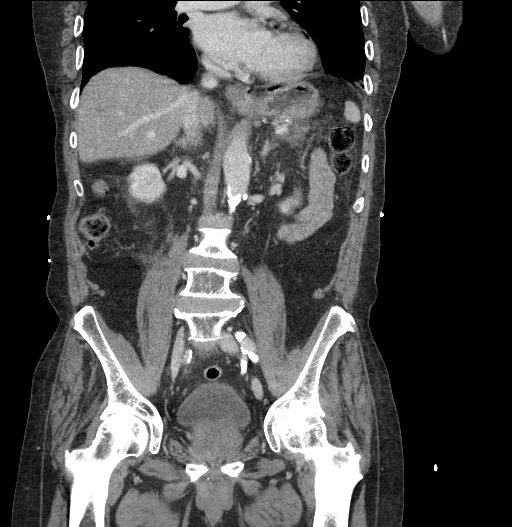

[16 of 46 positions shown; findings below may reference images not displayed]

FINDINGS: Lower chest: The visualized heart size within normal limits. No
pericardial fluid/thickening.

No hiatal hernia.

Minimal basilar atelectasis is seen.

Hepatobiliary: There is diffuse low density seen throughout the
liver parenchyma.The main portal vein is patent. No evidence of
calcified gallstones, gallbladder wall thickening or biliary
dilatation.

Pancreas: There is significant fat stranding changes seen throughout
the entirety of the pancreas. There is still normal pancreatic
enhancement seen throughout. No pancreatic ductal dilatation.
Probable vascular calcifications seen within the distal body of the
pancreas. No surrounding pancreatic fluid collections or evidence of
pancreatic necrosis. Fat stranding changes are seen extending along
the retroperitoneum into the deep pelvis and bilateral pericolic
gutters.

Spleen: Normal in size without focal abnormality.

Adrenals/Urinary Tract: Both adrenal glands appear normal. The
kidneys and collecting system appear normal without evidence of
urinary tract calculus or hydronephrosis. Bladder is unremarkable.

Stomach/Bowel: There is a small hiatal hernia present. Fat stranding
changes are seen surrounding the first and second portion of the
duodenum. The remainder of the small bowel is unremarkable. There is
scattered colonic diverticula without diverticulitis. The appendix
is unremarkable.

Vascular/Lymphatic: There are no enlarged mesenteric,
retroperitoneal, or pelvic lymph nodes. Scattered aortic
atherosclerotic calcifications are seen without aneurysmal
dilatation. The intra-abdominal aorta measures up to 2.8 cm at the
level of the origin of the celiac plexus.

Reproductive: The prostate is heterogeneous and enlarged

Other: No evidence of abdominal wall mass or hernia.

Musculoskeletal: No acute or significant osseous findings.
IMPRESSION: 1. Findings suggestive of extensive diffuse acute pancreatitis with
inflammatory changes extending into the bilateral pericolic gutters.
No evidence of pancreatic necrosis or surrounding pseudocyst.
2. Surrounding inflammatory changes around the duodenum.
3. Ectatic abdominal aorta at risk for aneurysm development at the
level of the celiac plexus. Recommend followup by ultrasound in 5
years. This recommendation follows ACR consensus guidelines: White
Paper of the ACR Incidental Findings Committee II on Vascular
Findings. [HOSPITAL] 6065; [DATE].
4. Aortic aneurysm NOS (41OAK-MOL.H)
5.  Aortic Atherosclerosis (41OAK-CLI.I).

## 2021-11-13 ENCOUNTER — Other Ambulatory Visit: Payer: Self-pay

## 2021-11-13 ENCOUNTER — Encounter (HOSPITAL_BASED_OUTPATIENT_CLINIC_OR_DEPARTMENT_OTHER): Payer: Self-pay | Admitting: Emergency Medicine

## 2021-11-13 ENCOUNTER — Encounter (HOSPITAL_COMMUNITY): Payer: Self-pay

## 2021-11-13 ENCOUNTER — Observation Stay (HOSPITAL_COMMUNITY): Payer: Medicare Other

## 2021-11-13 ENCOUNTER — Inpatient Hospital Stay (HOSPITAL_BASED_OUTPATIENT_CLINIC_OR_DEPARTMENT_OTHER)
Admission: EM | Admit: 2021-11-13 | Discharge: 2021-11-15 | DRG: 439 | Disposition: A | Discharge status: Home / Self Care | Payer: Medicare Other | Attending: Family Medicine | Admitting: Family Medicine

## 2021-11-13 ENCOUNTER — Emergency Department (HOSPITAL_BASED_OUTPATIENT_CLINIC_OR_DEPARTMENT_OTHER): Payer: Medicare Other

## 2021-11-13 DIAGNOSIS — M109 Gout, unspecified: Secondary | ICD-10-CM | POA: Diagnosis not present

## 2021-11-13 DIAGNOSIS — I4891 Unspecified atrial fibrillation: Secondary | ICD-10-CM | POA: Diagnosis not present

## 2021-11-13 DIAGNOSIS — I482 Chronic atrial fibrillation, unspecified: Secondary | ICD-10-CM | POA: Diagnosis not present

## 2021-11-13 DIAGNOSIS — K5792 Diverticulitis of intestine, part unspecified, without perforation or abscess without bleeding: Secondary | ICD-10-CM

## 2021-11-13 DIAGNOSIS — Z20822 Contact with and (suspected) exposure to covid-19: Secondary | ICD-10-CM | POA: Diagnosis present

## 2021-11-13 DIAGNOSIS — R319 Hematuria, unspecified: Secondary | ICD-10-CM | POA: Diagnosis present

## 2021-11-13 DIAGNOSIS — I10 Essential (primary) hypertension: Secondary | ICD-10-CM

## 2021-11-13 DIAGNOSIS — Z7901 Long term (current) use of anticoagulants: Secondary | ICD-10-CM

## 2021-11-13 DIAGNOSIS — K859 Acute pancreatitis without necrosis or infection, unspecified: Principal | ICD-10-CM

## 2021-11-13 DIAGNOSIS — E876 Hypokalemia: Secondary | ICD-10-CM

## 2021-11-13 DIAGNOSIS — E785 Hyperlipidemia, unspecified: Secondary | ICD-10-CM | POA: Diagnosis present

## 2021-11-13 DIAGNOSIS — Z6826 Body mass index (BMI) 26.0-26.9, adult: Secondary | ICD-10-CM

## 2021-11-13 DIAGNOSIS — Z7982 Long term (current) use of aspirin: Secondary | ICD-10-CM | POA: Diagnosis not present

## 2021-11-13 DIAGNOSIS — K5732 Diverticulitis of large intestine without perforation or abscess without bleeding: Secondary | ICD-10-CM | POA: Diagnosis present

## 2021-11-13 DIAGNOSIS — E440 Moderate protein-calorie malnutrition: Secondary | ICD-10-CM | POA: Diagnosis present

## 2021-11-13 DIAGNOSIS — Z87891 Personal history of nicotine dependence: Secondary | ICD-10-CM

## 2021-11-13 DIAGNOSIS — Z79899 Other long term (current) drug therapy: Secondary | ICD-10-CM

## 2021-11-13 DIAGNOSIS — H409 Unspecified glaucoma: Secondary | ICD-10-CM | POA: Diagnosis present

## 2021-11-13 HISTORY — DX: Diverticulitis of intestine, part unspecified, without perforation or abscess without bleeding: K57.92

## 2021-11-13 LAB — BASIC METABOLIC PANEL
Anion gap: 9 (ref 5–15)
BUN: 12 mg/dL (ref 8–23)
CO2: 24 mmol/L (ref 22–32)
Calcium: 8.9 mg/dL (ref 8.9–10.3)
Chloride: 106 mmol/L (ref 98–111)
Creatinine, Ser: 0.8 mg/dL (ref 0.61–1.24)
GFR, Estimated: >60 mL/min (ref >60)
Glucose, Bld: 113 mg/dL — ABNORMAL HIGH (ref 70–99)
Potassium: 3.5 mmol/L (ref 3.5–5.1)
Sodium: 139 mmol/L (ref 135–145)

## 2021-11-13 LAB — COMPREHENSIVE METABOLIC PANEL
ALT: 31 U/L (ref 0–44)
AST: 45 U/L — ABNORMAL HIGH (ref 15–41)
Albumin: 4 g/dL (ref 3.5–5.0)
Alkaline Phosphatase: 117 U/L (ref 38–126)
Anion gap: 12 (ref 5–15)
BUN: 21 mg/dL (ref 8–23)
CO2: 24 mmol/L (ref 22–32)
Calcium: 9.2 mg/dL (ref 8.9–10.3)
Chloride: 102 mmol/L (ref 98–111)
Creatinine, Ser: 1 mg/dL (ref 0.61–1.24)
GFR, Estimated: >60 mL/min (ref >60)
Glucose, Bld: 152 mg/dL — ABNORMAL HIGH (ref 70–99)
Potassium: 3.4 mmol/L — ABNORMAL LOW (ref 3.5–5.1)
Sodium: 138 mmol/L (ref 135–145)
Total Bilirubin: 1 mg/dL (ref 0.3–1.2)
Total Protein: 7.5 g/dL (ref 6.5–8.1)

## 2021-11-13 LAB — CBC
HCT: 40.8 % (ref 39.0–52.0)
HCT: 41 % (ref 39.0–52.0)
Hemoglobin: 13.4 g/dL (ref 13.0–17.0)
Hemoglobin: 13.5 g/dL (ref 13.0–17.0)
MCH: 28.3 pg (ref 26.0–34.0)
MCH: 28.4 pg (ref 26.0–34.0)
MCHC: 32.8 g/dL (ref 30.0–36.0)
MCHC: 32.9 g/dL (ref 30.0–36.0)
MCV: 86.1 fL (ref 80.0–100.0)
MCV: 86.1 fL (ref 80.0–100.0)
Platelets: 350 10*3/uL (ref 150–400)
Platelets: 351 10*3/uL (ref 150–400)
RBC: 4.74 MIL/uL (ref 4.22–5.81)
RBC: 4.76 MIL/uL (ref 4.22–5.81)
RDW: 14.8 % (ref 11.5–15.5)
RDW: 14.8 % (ref 11.5–15.5)
WBC: 14.4 10*3/uL — ABNORMAL HIGH (ref 4.0–10.5)
WBC: 12.2 10*3/uL — ABNORMAL HIGH (ref 4.0–10.5)
nRBC: 0 % (ref 0.0–0.2)
nRBC: 0 % (ref 0.0–0.2)

## 2021-11-13 LAB — SARS CORONAVIRUS 2 BY RT PCR: SARS Coronavirus 2 by RT PCR: NEGATIVE

## 2021-11-13 LAB — PROTIME-INR
INR: 2.6 — ABNORMAL HIGH (ref 0.8–1.2)
INR: 2.5 — ABNORMAL HIGH (ref 0.8–1.2)
Prothrombin Time: 27.5 seconds — ABNORMAL HIGH (ref 11.4–15.2)
Prothrombin Time: 27 seconds — ABNORMAL HIGH (ref 11.4–15.2)

## 2021-11-13 LAB — LACTIC ACID, PLASMA: Lactic Acid, Venous: 1.1 mmol/L (ref 0.5–1.9)

## 2021-11-13 LAB — MAGNESIUM: Magnesium: 1.7 mg/dL (ref 1.7–2.4)

## 2021-11-13 LAB — CK: Total CK: 61 U/L (ref 49–397)

## 2021-11-13 LAB — PHOSPHORUS: Phosphorus: 2.9 mg/dL (ref 2.5–4.6)

## 2021-11-13 LAB — LIPASE, BLOOD: Lipase: 3538 U/L — ABNORMAL HIGH (ref 11–51)

## 2021-11-13 MED ORDER — SODIUM CHLORIDE 0.9 % IV SOLN: INTRAVENOUS | Status: AC

## 2021-11-13 MED ORDER — LATANOPROST 0.005 % OP SOLN
1.0000 [drp] | Freq: Every day | OPHTHALMIC | Status: DC
Start: 2021-11-13 — End: 2021-11-15
  Administered 2021-11-14 (×2): 1 [drp] via OPHTHALMIC
  Filled 2021-11-13: qty 2.5

## 2021-11-13 MED ORDER — WARFARIN - PHARMACIST DOSING INPATIENT: Freq: Every day | Status: DC

## 2021-11-13 MED ORDER — MORPHINE SULFATE (PF) 4 MG/ML IV SOLN
4.0000 mg | Freq: Once | INTRAVENOUS | Status: AC
  Administered 2021-11-13: 4 mg via INTRAVENOUS
  Filled 2021-11-13: qty 1

## 2021-11-13 MED ORDER — IOHEXOL 300 MG/ML  SOLN
80.0000 mL | Freq: Once | INTRAMUSCULAR | Status: AC | PRN
  Administered 2021-11-13: 80 mL via INTRAVENOUS

## 2021-11-13 MED ORDER — MAGNESIUM SULFATE IN D5W 1-5 GM/100ML-% IV SOLN
1.0000 g | Freq: Once | INTRAVENOUS | Status: AC
  Administered 2021-11-14: 1 g via INTRAVENOUS
  Filled 2021-11-13: qty 100

## 2021-11-13 MED ORDER — MORPHINE SULFATE (PF) 2 MG/ML IV SOLN: 2.0000 mg | INTRAVENOUS | Status: DC | PRN

## 2021-11-13 MED ORDER — WARFARIN SODIUM 2.5 MG PO TABS
2.5000 mg | ORAL_TABLET | ORAL | Status: DC
Start: 2021-11-14 — End: 2021-11-14

## 2021-11-13 MED ORDER — METRONIDAZOLE 500 MG/100ML IV SOLN
500.0000 mg | Freq: Two times a day (BID) | INTRAVENOUS | Status: DC
  Administered 2021-11-13 – 2021-11-15 (×5): 500 mg via INTRAVENOUS
  Filled 2021-11-13 (×5): qty 100

## 2021-11-13 MED ORDER — AMLODIPINE BESYLATE 5 MG PO TABS
5.0000 mg | ORAL_TABLET | Freq: Every day | ORAL | Status: DC
  Administered 2021-11-13 – 2021-11-15 (×3): 5 mg via ORAL
  Filled 2021-11-13 (×3): qty 1

## 2021-11-13 MED ORDER — ATORVASTATIN CALCIUM 40 MG PO TABS
40.0000 mg | ORAL_TABLET | Freq: Every day | ORAL | Status: DC
  Administered 2021-11-13 – 2021-11-15 (×3): 40 mg via ORAL
  Filled 2021-11-13 (×3): qty 1

## 2021-11-13 MED ORDER — ACETAMINOPHEN 650 MG RE SUPP: 650.0000 mg | Freq: Four times a day (QID) | RECTAL | Status: DC | PRN

## 2021-11-13 MED ORDER — ACETAMINOPHEN 325 MG PO TABS: 650.0000 mg | ORAL_TABLET | Freq: Four times a day (QID) | ORAL | Status: DC | PRN

## 2021-11-13 MED ORDER — LACTATED RINGERS IV BOLUS
1000.0000 mL | Freq: Once | INTRAVENOUS | Status: AC
  Administered 2021-11-13: 1000 mL via INTRAVENOUS

## 2021-11-13 MED ORDER — SODIUM CHLORIDE 0.9 % IV SOLN
1.0000 g | Freq: Once | INTRAVENOUS | Status: AC
  Administered 2021-11-13: 1 g via INTRAVENOUS
  Filled 2021-11-13: qty 10

## 2021-11-13 MED ORDER — PANTOPRAZOLE SODIUM 40 MG IV SOLR
40.0000 mg | Freq: Every day | INTRAVENOUS | Status: DC
  Administered 2021-11-13 – 2021-11-14 (×2): 40 mg via INTRAVENOUS
  Filled 2021-11-13 (×2): qty 10

## 2021-11-13 MED ORDER — ONDANSETRON HCL 4 MG/2ML IJ SOLN
4.0000 mg | Freq: Once | INTRAMUSCULAR | Status: AC
  Administered 2021-11-13: 4 mg via INTRAVENOUS
  Filled 2021-11-13: qty 2

## 2021-11-13 MED ORDER — POTASSIUM CHLORIDE 10 MEQ/100ML IV SOLN
10.0000 meq | INTRAVENOUS | Status: AC
  Administered 2021-11-13 (×2): 10 meq via INTRAVENOUS
  Filled 2021-11-13 (×2): qty 100

## 2021-11-13 MED ORDER — HYDROCODONE-ACETAMINOPHEN 5-325 MG PO TABS: 1.0000 | ORAL_TABLET | ORAL | Status: DC | PRN

## 2021-11-13 MED ORDER — METOPROLOL TARTRATE 25 MG PO TABS
75.0000 mg | ORAL_TABLET | Freq: Two times a day (BID) | ORAL | Status: DC
  Administered 2021-11-13 – 2021-11-15 (×4): 75 mg via ORAL
  Filled 2021-11-13 (×4): qty 3

## 2021-11-13 MED ORDER — SODIUM CHLORIDE 0.9 % IV SOLN
2.0000 g | INTRAVENOUS | Status: DC
  Administered 2021-11-14 – 2021-11-15 (×2): 2 g via INTRAVENOUS
  Filled 2021-11-13 (×2): qty 20

## 2021-11-13 MED ORDER — LACTATED RINGERS IV SOLN: INTRAVENOUS | Status: DC

## 2021-11-13 NOTE — Progress Notes (Signed)
Since it's not necrotizing pancreatis (diverticulitis), we will continue with ceftriaxone/flagyl per Dr Adela Glimpse.  Ceftriaxone 2g IV q24 Flagyl 500mg  IV q12  , PharmD, [place], AAHIVP, CPP Infectious Disease Pharmacist 11/13/2021 7:13 PM

## 2021-11-13 NOTE — ED Triage Notes (Signed)
Pt c/o abdominal pain since 0000. Pt denies N/V/D

## 2021-11-13 NOTE — Assessment & Plan Note (Signed)
Resume NOrvasc when able to tolerate

## 2021-11-13 NOTE — Progress Notes (Signed)
ANTICOAGULATION CONSULT NOTE - Initial Consult  Pharmacy Consult for Warfarin Indication: atrial fibrillation  No Known Allergies  Patient Measurements: Height: 6\' 1"  (185.4 cm) Weight: 90.7 kg (200 lb) IBW/kg (Calculated) : 79.9  Vital Signs: Temp: 98.4 F (36.9 C) (08/22 1921) Temp Source: Oral (08/22 1921) BP: 168/105 (08/22 1921) Pulse Rate: 110 (08/22 1921)  Labs: Recent Labs    11/13/21 0319 11/13/21 0451 11/13/21 2003  HGB 13.4  --  13.5  HCT 40.8  --  41.0  PLT 350  --  351  LABPROT  --  27.5* 27.0*  INR  --  2.6* 2.5*  CREATININE 1.00  --  0.80  CKTOTAL  --   --  61    Estimated Creatinine Clearance: 74.9 mL/min (by C-G formula based on SCr of 0.8 mg/dL).   Medical History: Past Medical History:  Diagnosis Date   A-fib (HCC)    Hypertension    TIA (transient ischemic attack)     Medications:  Medications Prior to Admission  Medication Sig Dispense Refill Last Dose   acetaminophen (TYLENOL) 650 MG CR tablet Take 650 mg by mouth every 8 (eight) hours as needed for pain.   11/12/2021   albuterol (VENTOLIN HFA) 108 (90 Base) MCG/ACT inhaler Inhale into the lungs.   11/12/2021   allopurinol (ZYLOPRIM) 100 MG tablet Take 100 mg by mouth 2 (two) times daily.   11/12/2021   amLODipine (NORVASC) 5 MG tablet Take 5 mg by mouth daily.   11/12/2021   aspirin EC 81 MG tablet Take 81 mg by mouth daily.   11/12/2021   atorvastatin (LIPITOR) 40 MG tablet Take 40 mg by mouth daily.   11/12/2021   azithromycin (ZITHROMAX) 250 MG tablet Take 250 mg by mouth as directed.   11/12/2021   benzonatate (TESSALON) 100 MG capsule Take 100 mg by mouth 3 (three) times daily as needed for cough.   11/12/2021   FEROSUL 325 (65 Fe) MG tablet Take 325 mg by mouth 2 (two) times daily.   11/12/2021   latanoprost (XALATAN) 0.005 % ophthalmic solution Place 1 drop into both eyes at bedtime.   11/12/2021   Metoprolol Tartrate 75 MG TABS Take 75 mg by mouth in the morning and at bedtime.    11/12/2021   Multiple Vitamins-Minerals (MULTIVITAMIN WITH MINERALS) tablet Take 1 tablet by mouth daily.   11/12/2021   pantoprazole (PROTONIX) 40 MG tablet Take 40 mg by mouth daily.   11/12/2021   warfarin (COUMADIN) 5 MG tablet Take 1 tablet (5 mg total) by mouth daily at 4 PM. 30 tablet 1 11/12/2021   citalopram (CELEXA) 20 MG tablet Take 1 tablet (20 mg total) by mouth daily. 30 tablet 2     Assessment: 88 y.o. M presents with abd pain. Pt on warfarin PTA for afib. Admission INR 2.5 - therapeutic. CBC ok on admission. Home dose: 5mg  daily - last dose 8/21  Pt on Flagyl which will potentiate effects of warfarin  Goal of Therapy:  INR 2-3 Monitor platelets by anticoagulation protocol: Yes   Plan:  Warfarin 2.5mg  tonight  Daily INR  , PharmD, BCPS Please see amion for complete clinical pharmacist phone list 11/13/2021,11:10 PM

## 2021-11-13 NOTE — H&P (Signed)
[name]:[id number] DOB: [birth year] DOA: 11/13/2021     PCP: Ellyn Hack, MD   Outpatient Specialists:   NONE    Patient arrived to ER on 11/13/21 at 0307 Referred by Attending Magnus Ivan, MD   Patient coming from:    home Lives With family    Chief Complaint:   Chief Complaint  Patient presents with   Abdominal Pain    HPI: Christian Crawford is a 88 y.o. male with medical history significant of gout, HLD, HTN  glaucoma, prior hx of etoh induced pancreatitis, a.fib on coumadin    Presented with   severe epigastric pain  Presents with severe epigastric abd pain nausea and vomiting  Recent hx of bronchitis treated with z pack  Prior hx of etoh induced pancreatitis but have not had etoh since 2021 He has chronically dark stools and takes iron but no change  No diarrhea  No fever no chills  Abd pain now improved Denies ETOH for the past 2  years no tobacco   Regarding pertinent Chronic problems:     Hyperlipidemia -  on statins Lipitor (atorvastatin)  Lipid Panel      HTN on metoprolol, norvasc    Hx of CVA -  with/out residual deficits on coumadin   A. Fib -  - CHA2DS2 vas score  3      current  on anticoagulation with  Coumadin            -  Rate control:  Currently controlled with  Toprolol       While in ER: Clinical Course as of 11/13/21 1851  Tue Nov 13, 2021  0528 INR is therapeutic. He is on coumadin for afib.  [CS]  [id number] I personally viewed the images from radiology studies and agree with radiologist interpretation: CT is consistent with uncomplicated pancreatitis. Also noted to have some diverticulitis. Will give Abx to cover abdominal infections. Hospitalist paged for admission.   [CS]  [phone] Spoke with Dr. Imogene Burn, Hospitalist, who will accept for admission.  [CS]    Clinical Course User Index [CS] Pollyann Savoy, MD    CXR -  NON acute  CTabd/pelvis - matous pancreatitis without collection. Mild inflammation near the  ileocecal valve, favor diverticulitis.    Following Medications were ordered in ER: Medications  lactated ringers infusion (0 mLs Intravenous Stopped 11/13/21 1613)  metroNIDAZOLE (FLAGYL) IVPB 500 mg (500 mg Intravenous New Bag/Given 11/13/21 1814)  morphine (PF) 4 MG/ML injection 4 mg (4 mg Intravenous Given 11/13/21 0503)  ondansetron (ZOFRAN) injection 4 mg (4 mg Intravenous Given 11/13/21 0503)  lactated ringers bolus 1,000 mL (0 mLs Intravenous Stopped 11/13/21 0653)  iohexol (OMNIPAQUE) 300 MG/ML solution 80 mL (80 mLs Intravenous Contrast Given 11/13/21 0513)  cefTRIAXone (ROCEPHIN) 1 g in sodium chloride 0.9 % 100 mL IVPB (0 g Intravenous Stopped 11/13/21 0653)     ED Triage Vitals [11/13/21 0317]  Enc Vitals Group     BP (!) 137/91     Pulse Rate (!) 103     Resp 18     Temp 98.5 F (36.9 C)     Temp src      SpO2 97 %     Weight 200 lb (90.7 kg)     Height 6\' 1"  (1.854 m)     Head Circumference      Peak Flow      Pain Score 10     Pain Loc  Pain Edu?      Excl. in GC?   ZTIW(58)@     _________________________________________ Significant initial  Findings: Abnormal Labs Reviewed  LIPASE, BLOOD - Abnormal; Notable for the following components:      Result Value   Lipase 3,538 (*)    All other components within normal limits  COMPREHENSIVE METABOLIC PANEL - Abnormal; Notable for the following components:   Potassium 3.4 (*)    Glucose, Bld 152 (*)    AST 45 (*)    All other components within normal limits  CBC - Abnormal; Notable for the following components:   WBC 14.4 (*)    All other components within normal limits  PROTIME-INR - Abnormal; Notable for the following components:   Prothrombin Time 27.5 (*)    INR 2.6 (*)    All other components within normal limits       ECG: Ordered Personally reviewed and interpreted by me showing: HR : 113 Rhythm: Sinus tachycardia  nonspecific changes,   QTC 455   The recent clinical data is shown below. Vitals:    11/13/21 1400 11/13/21 1500 11/13/21 1631 11/13/21 1807  BP: (!) 185/82 (!) 162/77  (!) 186/91  Pulse: 99 98  (!) 110  Resp: 19 19  17   Temp: 98.7 F (37.1 C)  98.5 F (36.9 C)   SpO2: 98% 97%  95%  Weight:      Height:        WBC     Component Value Date/Time   WBC 14.4 (H) 11/13/2021 0319   LYMPHSABS 1.9 07/30/2019 1137   MONOABS 1.3 (H) 07/30/2019 1137   EOSABS 0.1 07/30/2019 1137   BASOSABS 0.0 07/30/2019 1137    Lactic Acid, Venous No results found for: "LATICACIDVEN"     UA ordered   Urine analysis:    Component Value Date/Time   COLORURINE YELLOW 07/24/2019 0435   APPEARANCEUR CLEAR 07/24/2019 0435   LABSPEC 1.036 (H) 07/24/2019 0435   PHURINE 7.0 07/24/2019 0435   GLUCOSEU NEGATIVE 07/24/2019 0435   HGBUR NEGATIVE 07/24/2019 0435   BILIRUBINUR NEGATIVE 07/24/2019 0435   KETONESUR NEGATIVE 07/24/2019 0435   PROTEINUR NEGATIVE 07/24/2019 0435   NITRITE NEGATIVE 07/24/2019 0435   LEUKOCYTESUR NEGATIVE 07/24/2019 0435    _______________________________________________ Hospitalist was called for admission for   Acute pancreatitis and  Diverticulitis    The following Work up has been ordered so far:  Orders Placed This Encounter  Procedures   CT Abdomen Pelvis W Contrast   Lipase, blood   Comprehensive metabolic panel   CBC   Protime-INR   Diet NPO time specified   Cardiac monitoring   Cardiac monitoring   Consult to hospitalist   Place in observation (patient's expected length of stay will be less than 2 midnights)   Place in observation (patient's expected length of stay will be less than 2 midnights)     OTHER Significant initial  Findings:  labs showing:  Recent Labs  Lab 11/13/21 0319  NA 138  K 3.4*  CO2 24  GLUCOSE 152*  BUN 21  CREATININE 1.00  CALCIUM 9.2    Cr    stable,   Lab Results  Component Value Date   CREATININE 1.00 11/13/2021   CREATININE 1.06 07/30/2019   CREATININE 0.73 07/26/2019    Recent Labs  Lab  11/13/21 0319  AST 45*  ALT 31  ALKPHOS 117  BILITOT 1.0  PROT 7.5  ALBUMIN 4.0   Lab Results  Component Value  Date   CALCIUM 9.2 11/13/2021   PHOS 3.6 07/24/2019    Plt: Lab Results  Component Value Date   PLT 350 11/13/2021     COVID-19 Labs  No results for input(s): "DDIMER", "FERRITIN", "LDH", "CRP" in the last 72 hours.  Lab Results  Component Value Date   SARSCOV2NAA NEGATIVE 07/24/2019     Recent Labs  Lab 11/13/21 0319  WBC 14.4*  HGB 13.4  HCT 40.8  MCV 86.1  PLT 350    HG/HCT   stable     Component Value Date/Time   HGB 13.4 11/13/2021 0319   HCT 40.8 11/13/2021 0319   MCV 86.1 11/13/2021 0319     Recent Labs  Lab 11/13/21 0319  LIPASE 3,538*      Cardiac Panel (last 3 results) Recent Labs    11/13/21 2003  CKTOTAL 61    DM  labs:  HbA1C: No results for input(s): "HGBA1C" in the last 8760 hours.     CBG (last 3)  No results for input(s): "GLUCAP" in the last 72 hours.        Cultures: No results found for: "SDES", "SPECREQUEST", "CULT", "REPTSTATUS"   Radiological Exams on Admission: CT Abdomen Pelvis W Contrast  Result Date: 11/13/2021 CLINICAL DATA:  Pancreatitis, acute, severe. EXAM: CT ABDOMEN AND PELVIS WITH CONTRAST TECHNIQUE: Multidetector CT imaging of the abdomen and pelvis was performed using the standard protocol following bolus administration of intravenous contrast. RADIATION DOSE REDUCTION: This exam was performed according to the departmental dose-optimization program which includes automated exposure control, adjustment of the mA and/or kV according to patient size and/or use of iterative reconstruction technique. CONTRAST:  80mL OMNIPAQUE IOHEXOL 300 MG/ML  SOLN COMPARISON:  07/24/2019 FINDINGS: Lower chest: Atherosclerosis including the aorta and coronaries. No acute finding Hepatobiliary: No focal liver abnormality.Full gallbladder. No calcified stone or bile duct dilatation. Pancreas: Peripancreatic edema. The  gland it is diffusely atrophic. No necrosis or collection. No venous thrombosis. Spleen: Unremarkable. Adrenals/Urinary Tract: Negative adrenals. No hydronephrosis or stone. Bilateral renal cortical scarring and lobulation. Unremarkable bladder. Stomach/Bowel: Fat stranding near the ileocecal bowel that is mild. Multiple diverticula in this area affecting colon and terminal ileum. No bowel obstruction or perforation. Vascular/Lymphatic: No acute vascular abnormality. Extensive atheromatous calcification of the aorta and branch vessels. No mass or adenopathy. Reproductive:Enlarged prostate measuring 6.3 cm in transverse span. Other: No ascites or pneumoperitoneum. Musculoskeletal: No acute abnormalities. Generalized lumbar spine degeneration. Bilateral hip osteoarthritis worse on the left. IMPRESSION: 1. Acute edematous pancreatitis without collection. 2. Mild inflammation near the ileocecal valve, favor diverticulitis. 3. Extensive atherosclerosis, including the coronary arteries. Electronically Signed   By: Tiburcio Pea M.D.   On: 11/13/2021 05:36   _______________________________________________________________________________________________________ Latest   Blood pressure (!) 186/91, pulse (!) 110, temperature 98.5 F (36.9 C), resp. rate 17, height  (1.854 m), weight 90.7 kg, SpO2 95 %.   Vitals  labs and radiology finding personally reviewed  Review of Systems:    Pertinent positives include:   abdominal pain, nausea, vomiting ,non-productive cough,   Constitutional:  No weight loss, night sweats, Fevers, chills, fatigue, weight loss  HEENT:  No headaches, Difficulty swallowing,Tooth/dental problems,Sore throat,  No sneezing, itching, ear ache, nasal congestion, post nasal drip,  Cardio-vascular:  No chest pain, Orthopnea, PND, anasarca, dizziness, palpitations.no Bilateral lower extremity swelling  GI:  No heartburn, indigestion,, diarrhea, change in bowel habits, loss of appetite,  melena, blood in stool, hematemesis Resp:  no shortness of breath at rest.  No dyspnea on exertion, No excess mucus, no productive cough, No non-productive cough, No coughing up of blood.No change in color of mucus.No wheezing. Skin:  no rash or lesions. No jaundice GU:  no dysuria, change in color of urine, no urgency or frequency. No straining to urinate.  No flank pain.  Musculoskeletal:  No joint pain or no joint swelling. No decreased range of motion. No back pain.  Psych:  No change in mood or affect. No depression or anxiety. No memory loss.  Neuro: no localizing neurological complaints, no tingling, no weakness, no double vision, no gait abnormality, no slurred speech, no confusion  All systems reviewed and apart from HOPI all are negative _______________________________________________________________________________________________ Past Medical History:   Past Medical History:  Diagnosis Date   A-fib (HCC)    Hypertension    TIA (transient ischemic attack)       Past Surgical History:  Procedure Laterality Date   EYE SURGERY     had "droopy eye"    Social History:  Ambulatory   independently        reports that he has quit smoking. His smoking use included cigarettes. He has a 100.00 pack-year smoking history. He has never used smokeless tobacco. He reports current alcohol use. He reports that he does not use drugs.   Family History:  Of DM in sister ______________________________________________________________________________________________ Allergies: No Known Allergies   Prior to Admission medications   Medication Sig Start Date End Date Taking? Authorizing Provider  acetaminophen (TYLENOL) 650 MG CR tablet Take 650 mg by mouth every 8 (eight) hours as needed for pain.   Yes [provider]  albuterol (VENTOLIN HFA) 108 (90 Base) MCG/ACT inhaler Inhale into the lungs. 11/12/21  Yes [provider]  allopurinol (ZYLOPRIM) 100 MG tablet  Take 100 mg by mouth 2 (two) times daily.   Yes [provider]  amLODipine (NORVASC) 5 MG tablet Take 5 mg by mouth daily.   Yes [provider]  aspirin EC 81 MG tablet Take 81 mg by mouth daily.   Yes [provider]  atorvastatin (LIPITOR) 40 MG tablet Take 40 mg by mouth daily.   Yes [provider]  azithromycin (ZITHROMAX) 250 MG tablet Take 250 mg by mouth as directed. 11/12/21  Yes [provider]  benzonatate (TESSALON) 100 MG capsule Take 100 mg by mouth 3 (three) times daily as needed for cough.   Yes [provider]  FEROSUL 325 (65 Fe) MG tablet Take 325 mg by mouth 2 (two) times daily. 11/01/21  Yes [provider]  latanoprost (XALATAN) 0.005 % ophthalmic solution Place 1 drop into both eyes at bedtime. 06/10/19  Yes [provider]  Metoprolol Tartrate 75 MG TABS Take 75 mg by mouth in the morning and at bedtime.   Yes [provider]  Multiple Vitamins-Minerals (MULTIVITAMIN WITH MINERALS) tablet Take 1 tablet by mouth daily.   Yes [provider]  pantoprazole (PROTONIX) 40 MG tablet Take 40 mg by mouth daily.   Yes [provider]  warfarin (COUMADIN) 5 MG tablet Take 1 tablet (5 mg total) by mouth daily at 4 PM. 07/26/19  Yes Shahmehdi, Seyed A, MD  citalopram (CELEXA) 20 MG tablet Take 1 tablet (20 mg total) by mouth daily. 07/26/19 07/25/20  Kendell Bane, MD    ___________________________________________________________________________________________________ Physical Exam:    11/13/2021    6:07 PM 11/13/2021    3:00 PM 11/13/2021    2:00 PM  Vitals with  BMI  Systolic 186 162 [id number]  Diastolic 91 77 82  Pulse 110 98 99     1. General:  in No  Acute distress   Chronically ill   -appearing 2. Psychological: Alert and   Oriented 3. Head/ENT:     Dry Mucous Membranes                          Head Non traumatic, neck supple                        Poor Dentition 4. SKIN:   decreased Skin turgor,  Skin clean Dry and intact no rash 5. Heart: Regular rate and rhythm no Murmur, no Rub or gallop 6. Lungs:   no wheezes or crackles   7. Abdomen: Soft,  non-tender, Non distended   obese  bowel sounds present 8. Lower extremities: no clubbing, cyanosis, no  edema 9. Neurologically Grossly intact, moving all 4 extremities equally  10. MSK: Normal range of motion    Chart has been reviewed  ______________________________________________________________________________________________  Assessment/Plan 88 y.o. male with medical history significant of gout, HLD, HTN  glaucoma, prior hx of etoh induced pancreatitis, a.fib on coumadin       Admitted for   Acute pancreatitis without infection or necrosis,   Diverticulitis     Present on Admission:  Acute pancreatitis  HTN (hypertension)  A-fib (HCC)  Diverticulitis  Hypokalemia     Acute pancreatitis    CT of abdomen showing acute pancreatitis  no evidence of pseudocyst Will rehydrate with aggressive IV fluids Keep n.p.o. Follow clinically    -Check lipid panel - Discontinue offending medication Control pain with IV pain medications If no significant improvement in the next 24 hours may benefit from GI consult     HTN (hypertension) Resume NOrvasc when able to tolerate  A-fib (HCC) Continue metoprolol 75 mg po BID Coumadin per pharmacy  Diverticulitis Continue rocephin and metronidazole Bowel rest  Hypokalemia Will replace potassium and magnesium and recheck in AM    Other plan as per orders.  DVT prophylaxis:   on coumadin   Code Status:    Code Status: Prior FULL CODE   as per patient    I had personally discussed CODE STATUS with patient     Family Communication:   Family not at  Bedside  plan of care was discussed on the phone with  Daughter,   Disposition Plan:      To home once workup is complete and patient is stable   Following barriers for discharge:                             Electrolytes corrected                                                           Pain controlled with PO medications                      Nutrition    consulted  Consults called: none  Admission status:  ED Disposition     ED Disposition  Admit   Condition  --   Comment  Hospital Area: [facility] [[id number]]  Level of Care: Telemetry Medical [104]  Interfacility transfer: Yes  May place patient in observation at [facility] or [facility] if equivalent level of care is available:: Yes  Covid Evaluation: Asymptomatic - no recent exposure (last 10 days) testing not required  Diagnosis: Acute pancreatitis [577.0.ICD-9-CM]  Admitting Physician: Magnus Ivan [[id number]]  Attending Physician: Magnus Ivan [[id number]]           Obs      Level of care     tele  For 1 24H      Juana Montini 11/13/2021, 9:23 PM    [facility]     after 2 AM please page floor coverage PA If 7AM-7PM, please contact the day team taking care of the patient using [email or web address]   Patient was evaluated in the context of the global COVID-19 pandemic, which necessitated consideration that the patient might be at risk for infection with the SARS-CoV-2 virus that causes COVID-19. Institutional protocols and algorithms that pertain to the evaluation of patients at risk for COVID-19 are in a state of rapid change based on information released by regulatory bodies including the CDC and federal and state organizations. These policies and algorithms were followed during the patient's care.

## 2021-11-13 NOTE — ED Provider Notes (Signed)
[facility] EMERGENCY DEPT  Provider Note  CSN: [id number] Arrival date & time: 11/13/21 0307  History Chief Complaint  Patient presents with   Abdominal Pain    Christian Crawford is a 88 y.o. male with history of a single episode of alcohol induced pancreatitis in 2021 has not had any alcohol since then and had been doing well. He had a cough recently, saw his doctor yesterday for bronchitis and given Rx for Z-pak and an inhaler. He went to bed earlier tonight feeling well but woke up around midnight with severe epigastric abdominal pain. Began vomiting while in the ED. He has not had any diarrhea. Has dark stools due to iron but no change recently.    Home Medications Prior to Admission medications   Medication Sig Start Date End Date Taking? Authorizing Provider  allopurinol (ZYLOPRIM) 100 MG tablet Take 100 mg by mouth 2 (two) times daily.    [provider]  amLODipine (NORVASC) 5 MG tablet Take 5 mg by mouth daily.    [provider]  aspirin EC 81 MG tablet Take 81 mg by mouth daily.    [provider]  atorvastatin (LIPITOR) 40 MG tablet Take 40 mg by mouth daily.    [provider]  citalopram (CELEXA) 20 MG tablet Take 1 tablet (20 mg total) by mouth daily. 07/26/19 07/25/20  Shahmehdi, Gemma Payor, MD  latanoprost (XALATAN) 0.005 % ophthalmic solution Place 1 drop into both eyes at bedtime. 06/10/19   [provider]  Metoprolol Tartrate 75 MG TABS Take 75 mg by mouth in the morning and at bedtime.    [provider]  Multiple Vitamins-Minerals (MULTIVITAMIN WITH MINERALS) tablet Take 1 tablet by mouth daily.    [provider]  pantoprazole (PROTONIX) 40 MG tablet Take 40 mg by mouth daily.    [provider]  warfarin (COUMADIN) 5 MG tablet Take 1 tablet (5 mg total) by mouth daily at 4 PM. 07/26/19   Shahmehdi, Gemma Payor, MD     Allergies    Patient has no known allergies.   Review of Systems    Review of Systems Please see HPI for pertinent positives and negatives  Physical Exam BP (!) 137/91   Pulse (!) 103   Temp 98.5 F (36.9 C)   Resp 18   Ht 6\' 1"  (1.854 m)   Wt 90.7 kg   SpO2 97%   BMI 26.39 kg/m   Physical Exam Vitals and nursing note reviewed.  Constitutional:      Appearance: Normal appearance.  HENT:     Head: Normocephalic and atraumatic.     Nose: Nose normal.     Mouth/Throat:     Mouth: Mucous membranes are moist.  Eyes:     Extraocular Movements: Extraocular movements intact.     Conjunctiva/sclera: Conjunctivae normal.  Cardiovascular:     Rate and Rhythm: Normal rate.  Pulmonary:     Effort: Pulmonary effort is normal.     Breath sounds: Normal breath sounds.  Abdominal:     General: Abdomen is flat.     Palpations: Abdomen is soft.     Tenderness: There is abdominal tenderness in the epigastric area. There is guarding. Negative signs include Murphy's sign and McBurney's sign.  Musculoskeletal:        General: No swelling. Normal range of motion.     Cervical back: Neck supple.  Skin:    General: Skin is warm and dry.  Neurological:  General: No focal deficit present.     Mental Status: He is alert.  Psychiatric:        Mood and Affect: Mood normal.     ED Results / Procedures / Treatments   EKG None  Procedures Procedures  Medications Ordered in the ED Medications  lactated ringers infusion ( Intravenous New Bag/Given 11/13/21 0510)  cefTRIAXone (ROCEPHIN) 1 g in sodium chloride 0.9 % 100 mL IVPB (has no administration in time range)  metroNIDAZOLE (FLAGYL) IVPB 500 mg (has no administration in time range)  morphine (PF) 4 MG/ML injection 4 mg (4 mg Intravenous Given 11/13/21 0503)  ondansetron (ZOFRAN) injection 4 mg (4 mg Intravenous Given 11/13/21 0503)  lactated ringers bolus 1,000 mL (1,000 mLs Intravenous New Bag/Given 11/13/21 0509)  iohexol (OMNIPAQUE) 300 MG/ML solution 80 mL (80 mLs Intravenous Contrast Given  11/13/21 0513)    Initial Impression and Plan  Patient with remote history of EtOH pancreatitis, has not had any alcohol since then, confirmed by family at bedside. He is here for epigastric pain, labs in triage show CBC with leukocytosis but normal Hgb. CMP with unremarkable LFTs however lipase is markedly elevated. Will begin pain/nausea meds for comfort. IVF and NPO. Send for CT to eval signs of necrosis or pseudocyst and to evaluate cholelithiasis. Anticipate admission, patient and family amenable.   ED Course   Clinical Course as of 11/13/21 0556  Tue Nov 13, 2021  0528 INR is therapeutic. He is on coumadin for afib.  [CS]  [id number] I personally viewed the images from radiology studies and agree with radiologist interpretation: CT is consistent with uncomplicated pancreatitis. Also noted to have some diverticulitis. Will give Abx to cover abdominal infections. Hospitalist paged for admission.   [CS]  [phone] Spoke with Dr. Imogene Burn, Hospitalist, who will accept for admission.  [CS]    Clinical Course User Index [CS] Pollyann Savoy, MD     MDM Rules/Calculators/A&P Medical Decision Making Problems Addressed: Acute pancreatitis without infection or necrosis, unspecified pancreatitis type: acute illness or injury Diverticulitis: acute illness or injury  Amount and/or Complexity of Data Reviewed Labs: ordered. Decision-making details documented in ED Course. Radiology: ordered and independent interpretation performed. Decision-making details documented in ED Course.  Risk Prescription drug management. Decision regarding hospitalization.    Final Clinical Impression(s) / ED Diagnoses Final diagnoses:  Acute pancreatitis without infection or necrosis, unspecified pancreatitis type  Diverticulitis    Rx / DC Orders ED Discharge Orders     None        Pollyann Savoy, MD 11/13/21 [phone]

## 2021-11-13 NOTE — Progress Notes (Signed)
  [facility] will assume care on arrival to accepting facility. Until arrival, care as per EDP. However, [facility] available 24/7 for questions and assistance.   Nursing staff please page [facility] Admits and Consults ([phone]) as soon as the patient arrives to the hospital.  Malissia Rabbani, DO [facility]  

## 2021-11-13 NOTE — Assessment & Plan Note (Signed)
Will replace potassium and magnesium and recheck in AM

## 2021-11-13 NOTE — Assessment & Plan Note (Signed)
Continue rocephin and metronidazole Bowel rest

## 2021-11-13 NOTE — Assessment & Plan Note (Signed)
   CT of abdomen showing acute pancreatitis  no evidence of pseudocyst Will rehydrate with aggressive IV fluids Keep n.p.o. Follow clinically    -Check lipid panel - Discontinue offending medication Control pain with IV pain medications If no significant improvement in the next 24 hours may benefit from GI consult

## 2021-11-13 NOTE — Assessment & Plan Note (Signed)
Continue metoprolol 75 mg po BID Coumadin per pharmacy

## 2021-11-13 NOTE — Subjective & Objective (Signed)
Presents with severe epigastric abd pain nausea and vomiting  Recent hx of bronchitis treated with z pack  Prior hx of etoh induced pancreatitis but have not had etoh since 2021 He has chronically dark stools and takes iron but no change  No diarrhea

## 2021-11-14 ENCOUNTER — Observation Stay (HOSPITAL_COMMUNITY): Payer: Medicare Other

## 2021-11-14 DIAGNOSIS — Z79899 Other long term (current) drug therapy: Secondary | ICD-10-CM | POA: Diagnosis not present

## 2021-11-14 DIAGNOSIS — E876 Hypokalemia: Secondary | ICD-10-CM | POA: Diagnosis present

## 2021-11-14 DIAGNOSIS — K859 Acute pancreatitis without necrosis or infection, unspecified: Secondary | ICD-10-CM | POA: Diagnosis present

## 2021-11-14 DIAGNOSIS — E440 Moderate protein-calorie malnutrition: Secondary | ICD-10-CM | POA: Diagnosis present

## 2021-11-14 DIAGNOSIS — Z87891 Personal history of nicotine dependence: Secondary | ICD-10-CM | POA: Diagnosis not present

## 2021-11-14 DIAGNOSIS — I482 Chronic atrial fibrillation, unspecified: Secondary | ICD-10-CM | POA: Diagnosis present

## 2021-11-14 DIAGNOSIS — M109 Gout, unspecified: Secondary | ICD-10-CM | POA: Diagnosis present

## 2021-11-14 DIAGNOSIS — H409 Unspecified glaucoma: Secondary | ICD-10-CM | POA: Diagnosis present

## 2021-11-14 DIAGNOSIS — E785 Hyperlipidemia, unspecified: Secondary | ICD-10-CM | POA: Diagnosis present

## 2021-11-14 DIAGNOSIS — Z6826 Body mass index (BMI) 26.0-26.9, adult: Secondary | ICD-10-CM | POA: Diagnosis not present

## 2021-11-14 DIAGNOSIS — R319 Hematuria, unspecified: Secondary | ICD-10-CM

## 2021-11-14 DIAGNOSIS — Z7901 Long term (current) use of anticoagulants: Secondary | ICD-10-CM | POA: Diagnosis not present

## 2021-11-14 DIAGNOSIS — I10 Essential (primary) hypertension: Secondary | ICD-10-CM | POA: Diagnosis present

## 2021-11-14 DIAGNOSIS — K5732 Diverticulitis of large intestine without perforation or abscess without bleeding: Secondary | ICD-10-CM | POA: Diagnosis present

## 2021-11-14 DIAGNOSIS — Z7982 Long term (current) use of aspirin: Secondary | ICD-10-CM | POA: Diagnosis not present

## 2021-11-14 DIAGNOSIS — Z20822 Contact with and (suspected) exposure to covid-19: Secondary | ICD-10-CM | POA: Diagnosis present

## 2021-11-14 HISTORY — DX: Hematuria, unspecified: R31.9

## 2021-11-14 LAB — COMPREHENSIVE METABOLIC PANEL
ALT: 29 U/L (ref 0–44)
AST: 30 U/L (ref 15–41)
Albumin: 3 g/dL — ABNORMAL LOW (ref 3.5–5.0)
Alkaline Phosphatase: 95 U/L (ref 38–126)
Anion gap: 6 (ref 5–15)
BUN: 14 mg/dL (ref 8–23)
CO2: 25 mmol/L (ref 22–32)
Calcium: 8.4 mg/dL — ABNORMAL LOW (ref 8.9–10.3)
Chloride: 108 mmol/L (ref 98–111)
Creatinine, Ser: 0.92 mg/dL (ref 0.61–1.24)
GFR, Estimated: >60 mL/min (ref >60)
Glucose, Bld: 120 mg/dL — ABNORMAL HIGH (ref 70–99)
Potassium: 4.1 mmol/L (ref 3.5–5.1)
Sodium: 139 mmol/L (ref 135–145)
Total Bilirubin: 0.8 mg/dL (ref 0.3–1.2)
Total Protein: 6 g/dL — ABNORMAL LOW (ref 6.5–8.1)

## 2021-11-14 LAB — LIPID PANEL
Cholesterol: 119 mg/dL (ref 0–200)
HDL: 33 mg/dL — ABNORMAL LOW (ref >40)
LDL Cholesterol: 77 mg/dL (ref 0–99)
Total CHOL/HDL Ratio: 3.6 RATIO
Triglycerides: 46 mg/dL (ref <150)
VLDL: 9 mg/dL (ref 0–40)

## 2021-11-14 LAB — PROCALCITONIN: Procalcitonin: 0.12 ng/mL

## 2021-11-14 LAB — URINALYSIS, COMPLETE (UACMP) WITH MICROSCOPIC
Bacteria, UA: NONE SEEN
Bilirubin Urine: NEGATIVE
Glucose, UA: NEGATIVE mg/dL
Ketones, ur: 5 mg/dL — AB
Leukocytes,Ua: NEGATIVE
Nitrite: NEGATIVE
Protein, ur: NEGATIVE mg/dL
Specific Gravity, Urine: 1.025 (ref 1.005–1.030)
pH: 5 (ref 5.0–8.0)

## 2021-11-14 LAB — CBC
HCT: 36.5 % — ABNORMAL LOW (ref 39.0–52.0)
Hemoglobin: 12 g/dL — ABNORMAL LOW (ref 13.0–17.0)
MCH: 28.5 pg (ref 26.0–34.0)
MCHC: 32.9 g/dL (ref 30.0–36.0)
MCV: 86.7 fL (ref 80.0–100.0)
Platelets: 325 10*3/uL (ref 150–400)
RBC: 4.21 MIL/uL — ABNORMAL LOW (ref 4.22–5.81)
RDW: 14.9 % (ref 11.5–15.5)
WBC: 11.3 10*3/uL — ABNORMAL HIGH (ref 4.0–10.5)
nRBC: 0 % (ref 0.0–0.2)

## 2021-11-14 LAB — LIPASE, BLOOD: Lipase: 165 U/L — ABNORMAL HIGH (ref 11–51)

## 2021-11-14 LAB — PROTIME-INR
INR: 2.8 — ABNORMAL HIGH (ref 0.8–1.2)
Prothrombin Time: 29.5 seconds — ABNORMAL HIGH (ref 11.4–15.2)

## 2021-11-14 LAB — LACTIC ACID, PLASMA: Lactic Acid, Venous: 0.9 mmol/L (ref 0.5–1.9)

## 2021-11-14 MED ORDER — ORAL CARE MOUTH RINSE: 15.0000 mL | OROMUCOSAL | Status: DC | PRN

## 2021-11-14 MED ORDER — ADULT MULTIVITAMIN W/MINERALS CH
1.0000 | ORAL_TABLET | Freq: Every day | ORAL | Status: DC
  Administered 2021-11-14 – 2021-11-15 (×2): 1 via ORAL
  Filled 2021-11-14 (×2): qty 1

## 2021-11-14 MED ORDER — WARFARIN SODIUM 5 MG PO TABS
5.0000 mg | ORAL_TABLET | Freq: Once | ORAL | Status: AC
  Administered 2021-11-14: 5 mg via ORAL
  Filled 2021-11-14: qty 1

## 2021-11-14 MED ORDER — BOOST / RESOURCE BREEZE PO LIQD CUSTOM
1.0000 | Freq: Three times a day (TID) | ORAL | Status: DC
  Administered 2021-11-15: 1 via ORAL

## 2021-11-14 NOTE — Progress Notes (Signed)
PROGRESS NOTE    Christian Crawford  [id number] DOB: 04-Apr-1933 DOA: 11/13/2021 PCP: Roselee Nova, MD  Chief Complaint  Patient presents with   Abdominal Pain    Brief Narrative:  Christian Crawford is Christian Crawford 88 y.o. male with medical history significant of gout, HLD, HTN  glaucoma, prior hx of etoh induced pancreatitis, Christian Crawford.fib on coumadin    Presented with   severe epigastric pain  Presents with severe epigastric abd pain nausea and vomiting  Recent hx of bronchitis treated with z pack  Prior hx of etoh induced pancreatitis but have not had etoh since 2021 He has chronically dark stools and takes iron but no change  No diarrhea  No fever no chills  Abd pain now improved Denies ETOH for the past 2  years no tobacco    Assessment & Plan:   Principal Problem:   Acute pancreatitis Active Problems:   Diverticulitis   HTN (hypertension)   Christian Crawford-fib (HCC)   Hypokalemia   Hematuria   Pancreatitis   Assessment and Plan: * Acute pancreatitis Unclear cause, he notes abstinence from alcohol for 2 years, RUQ [place] negative for gallstones.  No obvious meds. CT with acute pancreatitis RUQ [place] without cholelithiasis (gallbladder wall thickening noted, thought secondary phenomenon in setting of pancreatitis) Given suspicion for idiopathic pancreatitis, consider outpatient GI follow up   Diverticulitis Inflammation near ileocecal valve concerning for diverticulitis Continue abx with ceftriaxone, flagyl   HTN (hypertension) Metoprolol, amlodipine  Christian Crawford-fib (HCC) warfarin  Hypokalemia resolved  Hematuria Needs outpatient follow up     DVT prophylaxis: warfarin Code Status: full Family Communication: none Disposition:   Status is: Inpatient Remains inpatient appropriate because: need to advance diet, continued management of pancreatitis/diverticulitis   Consultants:  none  Procedures:  none  Antimicrobials:  Anti-infectives (From admission, onward)    Start     Dose/Rate  Route Frequency Ordered Stop   11/14/21 0500  cefTRIAXone (ROCEPHIN) 2 g in sodium chloride 0.9 % 100 mL IVPB        2 g 200 mL/hr over 30 Minutes Intravenous Every 24 hours 11/13/21 1912     11/13/21 0545  cefTRIAXone (ROCEPHIN) 1 g in sodium chloride 0.9 % 100 mL IVPB        1 g 200 mL/hr over 30 Minutes Intravenous  Once 11/13/21 0544 11/13/21 0653   11/13/21 0545  metroNIDAZOLE (FLAGYL) IVPB 500 mg        500 mg 100 mL/hr over 60 Minutes Intravenous Every 12 hours 11/13/21 0544         Subjective: Feels better today  Objective: Vitals:   11/13/21 1921 11/13/21 2130 11/14/21 0059 11/14/21 0424  BP: (!) 168/105 134/75 129/62   Pulse: (!) 110  86 84  Resp: 19 19 19    Temp: 98.4 F (36.9 C)  98.8 F (37.1 C) 98.7 F (37.1 C)  TempSrc: Oral  Oral Oral  SpO2: 98%  99% 98%  Weight:      Height:        Intake/Output Summary (Last 24 hours) at 11/14/2021 1511 Last data filed at 11/14/2021 0900 Gross per 24 hour  Intake 2151.42 ml  Output 400 ml  Net 1751.42 ml   Filed Weights   11/13/21 0317  Weight: 90.7 kg    Examination:  General exam: Appears calm and comfortable  Respiratory system: unlabored Cardiovascular system: RRR Gastrointestinal system: Abdomen is nondistended, soft and nontender.  Central nervous system: Alert and oriented. No focal neurological deficits. Extremities:  no LEE    Data Reviewed: I have personally reviewed following labs and imaging studies  CBC: Recent Labs  Lab 11/13/21 0319 11/13/21 2003 11/14/21 0104  WBC 14.4* 12.2* 11.3*  HGB 13.4 13.5 12.0*  HCT 40.8 41.0 36.5*  MCV 86.1 86.1 86.7  PLT 350 351 325    Basic Metabolic Panel: Recent Labs  Lab 11/13/21 0319 11/13/21 2003 11/14/21 0104  NA 138 139 139  K 3.4* 3.5 4.1  CL 102 106 108  CO2 24 24 25   GLUCOSE 152* 113* 120*  BUN 21 12 14   CREATININE 1.00 0.80 0.92  CALCIUM 9.2 8.9 8.4*  MG  --  1.7  --   PHOS  --  2.9  --     GFR: Estimated Creatinine  Clearance: 65.1 mL/min (by C-G formula based on SCr of 0.92 mg/dL).  Liver Function Tests: Recent Labs  Lab 11/13/21 0319 11/14/21 0104  AST 45* 30  ALT 31 29  ALKPHOS 117 95  BILITOT 1.0 0.8  PROT 7.5 6.0*  ALBUMIN 4.0 3.0*    CBG: No results for input(s): "GLUCAP" in the last 168 hours.   Recent Results (from the past 240 hour(s))  SARS Coronavirus 2 by RT PCR (hospital order, performed in [facility] hospital lab) *cepheid single result test* Anterior Nasal Swab     Status: None   Collection Time: 11/13/21  8:38 PM   Specimen: Anterior Nasal Swab  Result Value Ref Range Status   SARS Coronavirus 2 by RT PCR NEGATIVE NEGATIVE Final    Comment: (NOTE) SARS-CoV-2 target nucleic acids are NOT DETECTED.  The SARS-CoV-2 RNA is generally detectable in upper and lower respiratory specimens during the acute phase of infection. The lowest concentration of SARS-CoV-2 viral copies this assay can detect is 250 copies / mL. Christian Crawford negative result does not preclude SARS-CoV-2 infection and should not be used as the sole basis for treatment or other patient management decisions.  Christian Crawford negative result may occur with improper specimen collection / handling, submission of specimen other than nasopharyngeal swab, presence of viral mutation(s) within the areas targeted by this assay, and inadequate number of viral copies (<250 copies / mL). Christian Crawford negative result must be combined with clinical observations, patient history, and epidemiological information.  Fact Sheet for Patients:   [facility]  Fact Sheet for Healthcare Providers: 11/15/21  This test is not yet approved or  cleared by the [employer] FDA and has been authorized for detection and/or diagnosis of SARS-CoV-2 by FDA under an Emergency Use Authorization (EUA).  This EUA will remain in effect (meaning this test can be used) for the duration of the COVID-19 declaration  under Section 564(b)(1) of the Act, 21 U.S.C. section 360bbb-3(b)(1), unless the authorization is terminated or revoked sooner.  Performed at [facility], [address] [place], [address] [place]          Radiology Studies: [place] Abdomen Limited RUQ (LIVER/GB)  Result Date: 11/14/2021 CLINICAL DATA:  Pancreatitis, epigastric pain with nausea vomiting. EXAM: ULTRASOUND ABDOMEN LIMITED RIGHT UPPER QUADRANT COMPARISON:  CT abdomen and pelvis of November 13, 2021 FINDINGS: Gallbladder: No gallstones are visible. Wall thickness at 6 mm. No reported tenderness over the gallbladder. No pericholecystic fluid. Common bile duct: Diameter: 3 mm Liver: No focal lesion identified. Within normal limits in parenchymal echogenicity. Portal vein is patent on color Doppler imaging with normal direction of blood flow towards the liver. Other: None. IMPRESSION: Gallbladder wall thickening without pericholecystic fluid or  cholelithiasis. In the setting of pancreatitis this is more likely Clester Chlebowski secondary phenomenon. If there are laboratory or clinical parameters that raise concern for acute biliary process as well HIDA scan could be utilized for further assessment. Electronically Signed   By: Donzetta Kohut M.D.   On: 11/14/2021 11:02   DG Chest 2 View  Result Date: 11/13/2021 CLINICAL DATA:  Cough. EXAM: CHEST - 2 VIEW COMPARISON:  Lung bases from abdominal CT earlier today. Chest radiograph 07/30/2019 FINDINGS: The cardiomediastinal contours are normal. Streaky atelectasis in the lower lobes. Pulmonary vasculature is normal. No consolidation, pleural effusion, or pneumothorax. No acute osseous abnormalities are seen. IMPRESSION: Streaky atelectasis in the lower lobes. Electronically Signed   By: Narda Rutherford M.D.   On: 11/13/2021 19:46   CT Abdomen Pelvis W Contrast  Result Date: 11/13/2021 CLINICAL DATA:  Pancreatitis, acute, severe. EXAM: CT ABDOMEN AND PELVIS WITH CONTRAST TECHNIQUE: Multidetector CT  imaging of the abdomen and pelvis was performed using the standard protocol following bolus administration of intravenous contrast. RADIATION DOSE REDUCTION: This exam was performed according to the departmental dose-optimization program which includes automated exposure control, adjustment of the mA and/or kV according to patient size and/or use of iterative reconstruction technique. CONTRAST:  48mL OMNIPAQUE IOHEXOL 300 MG/ML  SOLN COMPARISON:  07/24/2019 FINDINGS: Lower chest: Atherosclerosis including the aorta and coronaries. No acute finding Hepatobiliary: No focal liver abnormality.Full gallbladder. No calcified stone or bile duct dilatation. Pancreas: Peripancreatic edema. The gland it is diffusely atrophic. No necrosis or collection. No venous thrombosis. Spleen: Unremarkable. Adrenals/Urinary Tract: Negative adrenals. No hydronephrosis or stone. Bilateral renal cortical scarring and lobulation. Unremarkable bladder. Stomach/Bowel: Fat stranding near the ileocecal bowel that is mild. Multiple diverticula in this area affecting colon and terminal ileum. No bowel obstruction or perforation. Vascular/Lymphatic: No acute vascular abnormality. Extensive atheromatous calcification of the aorta and branch vessels. No mass or adenopathy. Reproductive:Enlarged prostate measuring 6.3 cm in transverse span. Other: No ascites or pneumoperitoneum. Musculoskeletal: No acute abnormalities. Generalized lumbar spine degeneration. Bilateral hip osteoarthritis worse on the left. IMPRESSION: 1. Acute edematous pancreatitis without collection. 2. Mild inflammation near the ileocecal valve, favor diverticulitis. 3. Extensive atherosclerosis, including the coronary arteries. Electronically Signed   By: Tiburcio Pea M.D.   On: 11/13/2021 05:36        Scheduled Meds:  amLODipine  5 mg Oral Daily   atorvastatin  40 mg Oral Daily   latanoprost  1 drop Both Eyes QHS   metoprolol tartrate  75 mg Oral BID   pantoprazole  (PROTONIX) IV  40 mg Intravenous QHS   warfarin  5 mg Oral ONCE-1600   Warfarin - Pharmacist Dosing Inpatient   Does not apply q1600   Continuous Infusions:  cefTRIAXone (ROCEPHIN)  IV 2 g (11/14/21 0433)   lactated ringers 125 mL/hr at 11/14/21 1436   metronidazole 500 mg (11/14/21 0612)     LOS: 0 days    Time spent: over 30 min    Lacretia Nicks, MD [facility]   To contact the attending provider between 7A-7P or the covering provider during after hours 7P-7A, please log into the web site [email or web address] and access using universal  password for that web site. If you do not have the password, please call the hospital operator.  11/14/2021, 3:11 PM

## 2021-11-14 NOTE — Assessment & Plan Note (Signed)
warfarin

## 2021-11-14 NOTE — Evaluation (Signed)
Physical Therapy Evaluation & Discharge  Patient Details Name: Christian Crawford MRN: [id number] DOB: Jul 14, 1933 Today's Date: 11/14/2021  History of Present Illness  88 y/o male presented to ED on 11/13/21 for abdominal pain. CT showed acute pancreatitis. PMH: HTN, glaucoma, prior hx of ETOH induced pancreatitis, Afib, hx of CVA  Clinical Impression  PTA, patient lives alone and reports independence and driving. Patient currently functioning at independent level with no AD which is baseline for him. No apparent balance deficits noted during mobility. No further skilled PT needs identified acutely. No PT follow up recommended at this time. PT will sign off.      Recommendations for follow up therapy are one component of a multi-disciplinary discharge planning process, led by the attending physician.  Recommendations may be updated based on patient status, additional functional criteria and insurance authorization.  Follow Up Recommendations No PT follow up      Assistance Recommended at Discharge None  Patient can return home with the following       Equipment Recommendations None recommended by PT  Recommendations for Other Services       Functional Status Assessment Patient has not had a recent decline in their functional status     Precautions / Restrictions Precautions Precautions: None Restrictions Weight Bearing Restrictions: No      Mobility  Bed Mobility Overal bed mobility: Independent                  Transfers Overall transfer level: Independent Equipment used: None                    Ambulation/Gait Ambulation/Gait assistance: Independent Gait Distance (Feet): 500 Feet Assistive device: None Gait Pattern/deviations: WFL(Within Functional Limits)   Gait velocity interpretation: >4.37 ft/sec, indicative of normal walking speed      Stairs            Wheelchair Mobility    Modified Rankin (Stroke Patients Only)       Balance  Overall balance assessment: No apparent balance deficits (not formally assessed)                                           Pertinent Vitals/Pain Pain Assessment Pain Assessment: 0-10 Pain Score: 1  Pain Location: abdomen Pain Descriptors / Indicators: Sore Pain Intervention(s): Monitored during session    Home Living Family/patient expects to be discharged to:: Private residence Living Arrangements: Alone Available Help at Discharge: Family Type of Home: House Home Access: Stairs to enter Entrance Stairs-Rails: Doctor, general practice of Steps: 5   Home Layout: One level Home Equipment: None      Prior Function Prior Level of Function : Independent/Modified Independent;Driving             Mobility Comments: no AD; denies falls       Hand Dominance        Extremity/Trunk Assessment   Upper Extremity Assessment Upper Extremity Assessment: Overall WFL for tasks assessed    Lower Extremity Assessment Lower Extremity Assessment: Overall WFL for tasks assessed    Cervical / Trunk Assessment Cervical / Trunk Assessment: Normal  Communication   Communication: No difficulties  Cognition Arousal/Alertness: Awake/alert Behavior During Therapy: WFL for tasks assessed/performed Overall Cognitive Status: Within Functional Limits for tasks assessed  General Comments      Exercises     Assessment/Plan    PT Assessment Patient does not need any further PT services  PT Problem List         PT Treatment Interventions      PT Goals (Current goals can be found in the Care Plan section)  Acute Rehab PT Goals Patient Stated Goal: to go home PT Goal Formulation: All assessment and education complete, DC therapy    Frequency       Co-evaluation               AM-PAC PT "6 Clicks" Mobility  Outcome Measure Help needed turning from your back to your side while in a  flat bed without using bedrails?: None Help needed moving from lying on your back to sitting on the side of a flat bed without using bedrails?: None Help needed moving to and from a bed to a chair (including a wheelchair)?: None Help needed standing up from a chair using your arms (e.g., wheelchair or bedside chair)?: None Help needed to walk in hospital room?: None Help needed climbing 3-5 steps with a railing? : None 6 Click Score: 24    End of Session   Activity Tolerance: Patient tolerated treatment well Patient left: in bed;with call bell/phone within reach;with bed alarm set (bed alarm due to IV pole and heart monitor on opposite sides of bed) Nurse Communication: Mobility status PT Visit Diagnosis: Muscle weakness (generalized) (M62.81)    Time: 0626-9485 PT Time Calculation (min) (ACUTE ONLY): 19 min   Charges:   PT Evaluation $PT Eval Low Complexity: 1 Low          Nicola Heinemann A. Dan Humphreys PT, DPT Acute Rehabilitation Services Office [phone]   Viviann Spare 11/14/2021, 9:12 AM

## 2021-11-14 NOTE — Assessment & Plan Note (Signed)
Metoprolol, amlodipine °

## 2021-11-14 NOTE — Assessment & Plan Note (Addendum)
Needs outpatient follow up

## 2021-11-14 NOTE — Progress Notes (Signed)
Mobility Specialist Progress Note   11/14/21 1806  Mobility  Activity Ambulated independently in hallway  Level of Assistance Modified independent, requires aide device or extra time  Assistive Device None  Distance Ambulated (ft) 550 ft  Activity Response Tolerated well  $Mobility charge 1 Mobility   Pre Mobility: 78 HR During Mobility: 106 HR Post Mobility: 85 HR  Received pt in bed having no complaints and agreeable to mobility. Pt was asymptomatic throughout ambulation and returned to room w/o fault. Left in bed w/ call bell in reach and all needs met.  Holland Falling Mobility Specialist [facility] #:  [phone] Acute Rehab Office:  [phone]

## 2021-11-14 NOTE — Assessment & Plan Note (Signed)
Inflammation near ileocecal valve concerning for diverticulitis Continue abx with ceftriaxone, flagyl -> discharge with augmentin

## 2021-11-14 NOTE — Assessment & Plan Note (Signed)
resolved 

## 2021-11-14 NOTE — Progress Notes (Signed)
Initial Nutrition Assessment  DOCUMENTATION CODES:   Non-severe (moderate) malnutrition in context of chronic illness  INTERVENTION:  Boost Breeze po TID, each supplement provides 250 kcal and 9 grams of protein MVI with minerals daily  NUTRITION DIAGNOSIS:   Moderate Malnutrition related to chronic illness as evidenced by mild fat depletion, mild muscle depletion  GOAL:   Patient will meet greater than or equal to 90% of their needs  MONITOR:   PO intake, Supplement acceptance, Diet advancement, Labs, Weight trends  REASON FOR ASSESSMENT:   Consult Assessment of nutrition requirement/status  ASSESSMENT:   Pt admitted with abdominal pain r/t acute pancreatitis. PMH significant for gout, HLD, HTN, glaucoma, prior hx of EtOH induced pancreatitis and afib on coumadin.  Pt sitting on EOB eating clear liquid tray at time of visit. He states that this was his first meal since Monday and so far was tolerating well. Up until Monday when he began experiencing nausea and vomiting as well as some abdominal pain, he was eating well. He recalls cooking 2 meals per day-breakfast, a snack and dinner. He rarely eats out.  Pt states that his usual weight is about 200 lbs and denies significant recent weight loss. There is limited weight history on file per review of chart. Current weight is consistent with weight in 2021. Admit weight 90.7 kg.   Medications: protonix, warfarin IV drips: abx, LR at 167ml/hr, flagyl  Labs: lipase 165  NUTRITION - FOCUSED PHYSICAL EXAM:  Flowsheet Row Most Recent Value  Orbital Region Mild depletion  Upper Arm Region Mild depletion  Thoracic and Lumbar Region No depletion  Buccal Region Moderate depletion  Temple Region Moderate depletion  Clavicle Bone Region No depletion  Clavicle and Acromion Bone Region No depletion  Scapular Bone Region No depletion  Dorsal Hand No depletion  Patellar Region Mild depletion  Anterior Thigh Region Mild depletion   Posterior Calf Region No depletion  Edema (RD Assessment) None  Hair Reviewed  Eyes Reviewed  Mouth Reviewed  Skin Reviewed  Nails Reviewed      Diet Order:   Diet Order             Diet clear liquid Room service appropriate? Yes; Fluid consistency: Thin  Diet effective now                  EDUCATION NEEDS:   Education needs have been addressed  Skin:  Skin Assessment: Reviewed RN Assessment  Last BM:  8/21  Height:   Ht Readings from Last 1 Encounters:  11/13/21 6\' 1"  (1.854 m)    Weight:   Wt Readings from Last 1 Encounters:  11/13/21 90.7 kg   BMI:  Body mass index is 26.39 kg/m.  Estimated Nutritional Needs:   Kcal:  2100-2300  Protein:  105-120g  Fluid:  >/=2.1L  11/15/21, RDN, LDN Clinical Nutrition

## 2021-11-14 NOTE — Progress Notes (Signed)
Order for NS has expired. Lowell Guitar, MD paged and asked to reevaluate need for fluids.

## 2021-11-14 NOTE — Progress Notes (Signed)
OT Screen Note  Patient Details Name: Christian Crawford MRN: [id number] DOB: 1933/04/20   Cancelled Treatment:    Reason Eval/Treat Not Completed: OT screened, no needs identified, will sign off OT orders received. Consulted with PT who recently finished pt's PT evaluation. Patient currently functioning at independent level with no AD which is baseline for him. He reports no difficulty completing ADL tasks. Voices no concerns regarding discharge. No acute OT needs at this time. Thank you for the referral.   Limmie Patricia, OTR/L,CBIS  Supplemental OT - [facility]  11/14/2021, 9:19 AM

## 2021-11-14 NOTE — Progress Notes (Signed)
ANTICOAGULATION CONSULT NOTE   Pharmacy Consult for Warfarin Indication: atrial fibrillation  No Known Allergies  Patient Measurements: Height: 6\' 1"  (185.4 cm) Weight: 90.7 kg (200 lb) IBW/kg (Calculated) : 79.9  Vital Signs: Temp: 98.7 F (37.1 C) (08/23 0424) Temp Source: Oral (08/23 0424) BP: 129/62 (08/23 0059) Pulse Rate: 84 (08/23 0424)  Labs: Recent Labs    11/13/21 0319 11/13/21 0451 11/13/21 2003 11/14/21 0104  HGB 13.4  --  13.5 12.0*  HCT 40.8  --  41.0 36.5*  PLT 350  --  351 325  LABPROT  --  27.5* 27.0* 29.5*  INR  --  2.6* 2.5* 2.8*  CREATININE 1.00  --  0.80 0.92  CKTOTAL  --   --  61  --      Estimated Creatinine Clearance: 65.1 mL/min (by C-G formula based on SCr of 0.92 mg/dL).  Assessment: 88 y.o. M presents with abd pain. Pt on warfarin PTA for afib. Admission INR 2.5 - therapeutic. CBC ok on admission. Home dose: 5mg  daily - last dose 8/21  Pt on Flagyl which will potentiate effects of warfarin  Goal of Therapy:  INR 2-3 Monitor platelets by anticoagulation protocol: Yes   Plan:  Warfarin 5mg  tonight  Daily INR  Thank you , PharmD 11/14/2021,8:38 AM

## 2021-11-14 NOTE — Hospital Course (Signed)
Christian Crawford is Christian Crawford 88 y.o. male with medical history significant of gout, HLD, HTN  glaucoma, prior hx of etoh induced pancreatitis, Christian Crawford.fib on coumadin.  CT was notable for acute edematous pancreatitis and diverticulitis.  He's improved with supportive care and abx.  Plan is for discharge to home with outpatient GI follow up.  See below for additional details

## 2021-11-14 NOTE — Assessment & Plan Note (Signed)
Unclear cause, he notes abstinence from alcohol for 2 years, RUQ [place] negative for gallstones.  No obvious meds. CT with acute pancreatitis RUQ [place] without cholelithiasis (gallbladder wall thickening noted, thought secondary phenomenon in setting of pancreatitis) Given suspicion for idiopathic pancreatitis, consider outpatient GI follow up

## 2021-11-15 DIAGNOSIS — K859 Acute pancreatitis without necrosis or infection, unspecified: Secondary | ICD-10-CM | POA: Diagnosis not present

## 2021-11-15 LAB — CBC WITH DIFFERENTIAL/PLATELET
Abs Immature Granulocytes: 0.03 10*3/uL (ref 0.00–0.07)
Basophils Absolute: 0 10*3/uL (ref 0.0–0.1)
Basophils Relative: 0 %
Eosinophils Absolute: 0.7 10*3/uL — ABNORMAL HIGH (ref 0.0–0.5)
Eosinophils Relative: 6 %
HCT: 37.1 % — ABNORMAL LOW (ref 39.0–52.0)
Hemoglobin: 12.5 g/dL — ABNORMAL LOW (ref 13.0–17.0)
Immature Granulocytes: 0 %
Lymphocytes Relative: 24 %
Lymphs Abs: 2.8 10*3/uL (ref 0.7–4.0)
MCH: 28.6 pg (ref 26.0–34.0)
MCHC: 33.7 g/dL (ref 30.0–36.0)
MCV: 84.9 fL (ref 80.0–100.0)
Monocytes Absolute: 1 10*3/uL (ref 0.1–1.0)
Monocytes Relative: 8 %
Neutro Abs: 7.1 10*3/uL (ref 1.7–7.7)
Neutrophils Relative %: 62 %
Platelets: 355 10*3/uL (ref 150–400)
RBC: 4.37 MIL/uL (ref 4.22–5.81)
RDW: 14.4 % (ref 11.5–15.5)
WBC: 11.7 10*3/uL — ABNORMAL HIGH (ref 4.0–10.5)
nRBC: 0 % (ref 0.0–0.2)

## 2021-11-15 LAB — COMPREHENSIVE METABOLIC PANEL
ALT: 27 U/L (ref 0–44)
AST: 30 U/L (ref 15–41)
Albumin: 3.1 g/dL — ABNORMAL LOW (ref 3.5–5.0)
Alkaline Phosphatase: 93 U/L (ref 38–126)
Anion gap: 9 (ref 5–15)
BUN: 7 mg/dL — ABNORMAL LOW (ref 8–23)
CO2: 24 mmol/L (ref 22–32)
Calcium: 8.7 mg/dL — ABNORMAL LOW (ref 8.9–10.3)
Chloride: 106 mmol/L (ref 98–111)
Creatinine, Ser: 0.77 mg/dL (ref 0.61–1.24)
GFR, Estimated: >60 mL/min (ref >60)
Glucose, Bld: 104 mg/dL — ABNORMAL HIGH (ref 70–99)
Potassium: 3.6 mmol/L (ref 3.5–5.1)
Sodium: 139 mmol/L (ref 135–145)
Total Bilirubin: 0.8 mg/dL (ref 0.3–1.2)
Total Protein: 6.3 g/dL — ABNORMAL LOW (ref 6.5–8.1)

## 2021-11-15 LAB — PROCALCITONIN: Procalcitonin: <0.1 ng/mL

## 2021-11-15 LAB — PHOSPHORUS: Phosphorus: 2.5 mg/dL (ref 2.5–4.6)

## 2021-11-15 LAB — MAGNESIUM: Magnesium: 1.9 mg/dL (ref 1.7–2.4)

## 2021-11-15 LAB — PROTIME-INR
INR: 3 — ABNORMAL HIGH (ref 0.8–1.2)
Prothrombin Time: 30.9 seconds — ABNORMAL HIGH (ref 11.4–15.2)

## 2021-11-15 MED ORDER — WARFARIN SODIUM 1 MG PO TABS
1.0000 mg | ORAL_TABLET | Freq: Once | ORAL | Status: AC
  Administered 2021-11-15: 1 mg via ORAL
  Filled 2021-11-15: qty 1

## 2021-11-15 MED ORDER — AMOXICILLIN-POT CLAVULANATE 875-125 MG PO TABS: 1.0000 | ORAL_TABLET | Freq: Two times a day (BID) | ORAL | 0 refills | Status: AC

## 2021-11-15 NOTE — Plan of Care (Signed)

## 2021-11-15 NOTE — Progress Notes (Signed)
ANTICOAGULATION CONSULT NOTE   Pharmacy Consult for Warfarin Indication: atrial fibrillation  No Known Allergies  Patient Measurements: Height: 6\' 1"  (185.4 cm) Weight: 90.7 kg (200 lb) IBW/kg (Calculated) : 79.9  Vital Signs: Temp: 98.3 F (36.8 C) (08/23 2052) Temp Source: Oral (08/23 2052) BP: 173/92 (08/24 0509) Pulse Rate: 79 (08/24 0509)  Labs: Recent Labs    11/13/21 2003 11/14/21 0104 11/15/21 0207  HGB 13.5 12.0* 12.5*  HCT 41.0 36.5* 37.1*  PLT 351 325 355  LABPROT 27.0* 29.5* 30.9*  INR 2.5* 2.8* 3.0*  CREATININE 0.80 0.92 0.77  CKTOTAL 61  --   --      Estimated Creatinine Clearance: 74.9 mL/min (by C-G formula based on SCr of 0.77 mg/dL).  Assessment: 88 y.o. M presents with abd pain. Pt on warfarin PTA for afib. Admission INR 2.5 - therapeutic. CBC ok on admission. Home dose: 5mg  daily - last dose 8/21  Pt on Flagyl which will potentiate effects of warfarin  INR up to 3 today  Goal of Therapy:  INR 2-3 Monitor platelets by anticoagulation protocol: Yes   Plan:  Warfarin 1mg  tonight  Daily INR  Thank you , PharmD 11/15/2021,8:24 AM

## 2021-11-15 NOTE — Progress Notes (Signed)
Mobility Specialist Progress Note:   11/15/21 1316  Mobility  Activity Transferred to/from [facility];Ambulated with assistance in hallway  Level of Assistance Modified independent, requires aide device or extra time  Assistive Device Front wheel walker  Distance Ambulated (ft) 650 ft  Activity Response Tolerated well  $Mobility charge 1 Mobility   Pt received in bed, requesting transfer to [facility]. MinG for pericare. Pt agreeable to ambulation. During ambulation, experienced a coughing spell, self recovered. Pt in chair with all needs met and call bell in reach.   Monicka Cyran Mobility Specialist-Acute Rehab Secure Chat only

## 2021-11-15 NOTE — Progress Notes (Deleted)
Physician Discharge Summary  Christian Crawford JKK:[id number] DOB: 03-03-1934 DOA: 11/13/2021  PCP: Ellyn Hack, MD  Admit date: 11/13/2021 Discharge date: 11/15/2021  Time spent: 40 minutes  Recommendations for Outpatient Follow-up:  Follow outpatient CBC/CMP  Follow outpatient GI for idiopathic pancreatitis Follow INR outpatient  Hematuria here, follow outpatient    Discharge Diagnoses:  Principal Problem:   Acute pancreatitis Active Problems:   Diverticulitis   HTN (hypertension)   Christian Crawford (HCC)   Hypokalemia   Hematuria   Pancreatitis  Discharge Condition: stable  Diet recommendation: heart healthy  Filed Weights   11/13/21 0317  Weight: 90.7 kg    History of present illness:  Christian Crawford is Christian Crawford 88 y.o. male with medical history significant of gout, HLD, HTN  glaucoma, prior hx of etoh induced pancreatitis, Christian Crawford.fib on coumadin.  CT was notable for acute edematous pancreatitis and diverticulitis.  He's improved with supportive care and abx.  Plan is for discharge to home with outpatient GI follow up.  See below for additional details   Hospital Course:  Assessment and Plan: * Acute pancreatitis Unclear cause, he notes abstinence from alcohol for 2 years, RUQ [place] negative for gallstones.  No obvious meds. CT with acute pancreatitis RUQ [place] without cholelithiasis (gallbladder wall thickening noted, thought secondary phenomenon in setting of pancreatitis) Given suspicion for idiopathic pancreatitis, consider outpatient GI follow up   Diverticulitis Inflammation near ileocecal valve concerning for diverticulitis Continue abx with ceftriaxone, flagyl -> discharge with augmentin  HTN (hypertension) Metoprolol, amlodipine  Christian Crawford-fib (HCC) warfarin  Hypokalemia resolved  Hematuria Needs outpatient follow up with PCP (consider urology)     Procedures: none   Consultations: none  Discharge Exam: Vitals:   11/15/21 0509 11/15/21 0953  BP: (!) 173/92 (!)  177/82  Pulse: 79   Resp: 17 (!) 21  Temp:  98 F (36.7 C)  SpO2: 100% 99%   General: No acute distress. Cardiovascular: RRR Lungs: unlabored Abdomen: Soft, nontender, nondistended  Neurological: Alert and oriented 3. Moves all extremities 4 with equal strength. Cranial nerves II through XII grossly intact. Extremities: No clubbing or cyanosis. No edema.  Discharge Instructions   Discharge Instructions     Ambulatory referral to Gastroenterology   Complete by: As directed    Idiopathic pancreatitis   What is the reason for referral?: Other Comment - pancreatitis   Call MD for:  difficulty breathing, headache or visual disturbances   Complete by: As directed    Call MD for:  extreme fatigue   Complete by: As directed    Call MD for:  hives   Complete by: As directed    Call MD for:  persistant dizziness or light-headedness   Complete by: As directed    Call MD for:  persistant nausea and vomiting   Complete by: As directed    Call MD for:  redness, tenderness, or signs of infection (pain, swelling, redness, odor or green/yellow discharge around incision site)   Complete by: As directed    Call MD for:  severe uncontrolled pain   Complete by: As directed    Call MD for:  temperature >100.4   Complete by: As directed    Diet - low sodium heart healthy   Complete by: As directed    Discharge instructions   Complete by: As directed    You were seen for pancreatitis and diverticulitis.  You've improved.  We'll send you home with antibiotics to finish for your diverticulitis.  I'm not clear  what caused your pancreatitis.  This is commonly caused by alcohol (you're abstaining) and gallstones (there were none seen on your ultrasound).  I'm going to send you to see gastroenterology as an outpatient to consider additional workup.   You'll need to follow up your INR over the next few days as your on antibiotics.   You had blood in your urine.  This should be followed up with  your PCP outpatient and possibly urology.    Return for new, recurrent, or worsening symptoms.  Please ask your PCP to request records from this hospitalization so they know what was done and what the next steps will be.   Increase activity slowly   Complete by: As directed       Allergies as of 11/15/2021   No Known Allergies      Medication List     STOP taking these medications    azithromycin 250 MG tablet Commonly known as: ZITHROMAX   citalopram 20 MG tablet Commonly known as: CeleXA       TAKE these medications    acetaminophen 650 MG CR tablet Commonly known as: TYLENOL Take 650 mg by mouth every 8 (eight) hours as needed for pain.   albuterol 108 (90 Base) MCG/ACT inhaler Commonly known as: VENTOLIN HFA Inhale into the lungs.   allopurinol 100 MG tablet Commonly known as: ZYLOPRIM Take 100 mg by mouth 2 (two) times daily.   amLODipine 5 MG tablet Commonly known as: NORVASC Take 5 mg by mouth daily.   amoxicillin-clavulanate 875-125 MG tablet Commonly known as: AUGMENTIN Take 1 tablet by mouth 2 (two) times daily for 7 days.   aspirin EC 81 MG tablet Take 81 mg by mouth daily.   atorvastatin 40 MG tablet Commonly known as: LIPITOR Take 40 mg by mouth daily.   benzonatate 100 MG capsule Commonly known as: TESSALON Take 100 mg by mouth 3 (three) times daily as needed for cough.   FeroSul 325 (65 FE) MG tablet Generic drug: ferrous sulfate Take 325 mg by mouth 2 (two) times daily.   latanoprost 0.005 % ophthalmic solution Commonly known as: XALATAN Place 1 drop into both eyes at bedtime.   Metoprolol Tartrate 75 MG Tabs Take 75 mg by mouth in the morning and at bedtime.   multivitamin with minerals tablet Take 1 tablet by mouth daily.   pantoprazole 40 MG tablet Commonly known as: PROTONIX Take 40 mg by mouth daily.   warfarin 5 MG tablet Commonly known as: COUMADIN Take 1 tablet (5 mg total) by mouth daily at 4 PM.       No  Known Allergies  Follow-up Information     Ellyn Hack, MD Follow up.   Specialty: Family Medicine Contact information: [address] [place] [address] [phone]         [facility] Follow up.   Specialty: Gastroenterology Contact information: [address] [place] [address] [phone]                 The results of significant diagnostics from this hospitalization (including imaging, microbiology, ancillary and laboratory) are listed below for reference.    Significant Diagnostic Studies: US Abdomen Limited RUQ (LIVER/GB)  Result Date: 11/14/2021 CLINICAL DATA:  Pancreatitis, epigastric pain with nausea vomiting. EXAM: ULTRASOUND ABDOMEN LIMITED RIGHT UPPER QUADRANT COMPARISON:  CT abdomen and pelvis of November 13, 2021 FINDINGS: Gallbladder: No gallstones are visible. Wall thickness at 6 mm. No reported tenderness over the gallbladder.  No pericholecystic fluid. Common bile duct: Diameter: 3 mm Liver: No focal lesion identified. Within normal limits in parenchymal echogenicity. Portal vein is patent on color Doppler imaging with normal direction of blood flow towards the liver. Other: None. IMPRESSION: Gallbladder wall thickening without pericholecystic fluid or cholelithiasis. In the setting of pancreatitis this is more likely Elo Marmolejos secondary phenomenon. If there are laboratory or clinical parameters that raise concern for acute biliary process as well HIDA scan could be utilized for further assessment. Electronically Signed   By: Donzetta Kohut M.D.   On: 11/14/2021 11:02   DG Chest 2 View  Result Date: 11/13/2021 CLINICAL DATA:  Cough. EXAM: CHEST - 2 VIEW COMPARISON:  Lung bases from abdominal CT earlier today. Chest radiograph 07/30/2019 FINDINGS: The cardiomediastinal contours are normal. Streaky atelectasis in the lower lobes. Pulmonary vasculature is normal. No consolidation, pleural effusion, or pneumothorax. No acute  osseous abnormalities are seen. IMPRESSION: Streaky atelectasis in the lower lobes. Electronically Signed   By: Narda Rutherford M.D.   On: 11/13/2021 19:46   CT Abdomen Pelvis W Contrast  Result Date: 11/13/2021 CLINICAL DATA:  Pancreatitis, acute, severe. EXAM: CT ABDOMEN AND PELVIS WITH CONTRAST TECHNIQUE: Multidetector CT imaging of the abdomen and pelvis was performed using the standard protocol following bolus administration of intravenous contrast. RADIATION DOSE REDUCTION: This exam was performed according to the departmental dose-optimization program which includes automated exposure control, adjustment of the mA and/or kV according to patient size and/or use of iterative reconstruction technique. CONTRAST:  10mL OMNIPAQUE IOHEXOL 300 MG/ML  SOLN COMPARISON:  07/24/2019 FINDINGS: Lower chest: Atherosclerosis including the aorta and coronaries. No acute finding Hepatobiliary: No focal liver abnormality.Full gallbladder. No calcified stone or bile duct dilatation. Pancreas: Peripancreatic edema. The gland it is diffusely atrophic. No necrosis or collection. No venous thrombosis. Spleen: Unremarkable. Adrenals/Urinary Tract: Negative adrenals. No hydronephrosis or stone. Bilateral renal cortical scarring and lobulation. Unremarkable bladder. Stomach/Bowel: Fat stranding near the ileocecal bowel that is mild. Multiple diverticula in this area affecting colon and terminal ileum. No bowel obstruction or perforation. Vascular/Lymphatic: No acute vascular abnormality. Extensive atheromatous calcification of the aorta and branch vessels. No mass or adenopathy. Reproductive:Enlarged prostate measuring 6.3 cm in transverse span. Other: No ascites or pneumoperitoneum. Musculoskeletal: No acute abnormalities. Generalized lumbar spine degeneration. Bilateral hip osteoarthritis worse on the left. IMPRESSION: 1. Acute edematous pancreatitis without collection. 2. Mild inflammation near the ileocecal valve, favor  diverticulitis. 3. Extensive atherosclerosis, including the coronary arteries. Electronically Signed   By: Tiburcio Pea M.D.   On: 11/13/2021 05:36    Microbiology: Recent Results (from the past 240 hour(s))  SARS Coronavirus 2 by RT PCR (hospital order, performed in [facility] lab) *cepheid single result test* Anterior Nasal Swab     Status: None   Collection Time: 11/13/21  8:38 PM   Specimen: Anterior Nasal Swab  Result Value Ref Range Status   SARS Coronavirus 2 by RT PCR NEGATIVE NEGATIVE Final    Comment: (NOTE) SARS-CoV-2 target nucleic acids are NOT DETECTED.  The SARS-CoV-2 RNA is generally detectable in upper and lower respiratory specimens during the acute phase of infection. The lowest concentration of SARS-CoV-2 viral copies this assay can detect is 250 copies / mL. Tanith Dagostino negative result does not preclude SARS-CoV-2 infection and should not be used as the sole basis for treatment or other patient management decisions.  Ronte Parker negative result may occur with improper specimen collection / handling, submission of specimen other than nasopharyngeal swab, presence of  viral mutation(s) within the areas targeted by this assay, and inadequate number of viral copies (<250 copies / mL). Sebrina Kessner negative result must be combined with clinical observations, patient history, and epidemiological information.  Fact Sheet for Patients:   https://www.patel.info/  Fact Sheet for Healthcare Providers: https://hall.com/  This test is not yet approved or  cleared by the [place] FDA and has been authorized for detection and/or diagnosis of SARS-CoV-2 by FDA under an Emergency Use Authorization (EUA).  This EUA will remain in effect (meaning this test can be used) for the duration of the COVID-19 declaration under Section 564(b)(1) of the Act, 21 U.S.C. section 360bbb-3(b)(1), unless the authorization is terminated or revoked sooner.  Performed  at [facility], [place] [address], [place], [place] [address]      Labs: Basic Metabolic Panel: Recent Labs  Lab 11/13/21 0319 11/13/21 2003 11/14/21 0104 11/15/21 0207  NA 138 139 139 139  K 3.4* 3.5 4.1 3.6  CL 102 106 108 106  CO2 24 24 25 24   GLUCOSE 152* 113* 120* 104*  BUN 21 12 14  7*  CREATININE 1.00 0.80 0.92 0.77  CALCIUM 9.2 8.9 8.4* 8.7*  MG  --  1.7  --  1.9  PHOS  --  2.9  --  2.5   Liver Function Tests: Recent Labs  Lab 11/13/21 0319 11/14/21 0104 11/15/21 0207  AST 45* 30 30  ALT 31 29 27   ALKPHOS 117 95 93  BILITOT 1.0 0.8 0.8  PROT 7.5 6.0* 6.3*  ALBUMIN 4.0 3.0* 3.1*   Recent Labs  Lab 11/13/21 0319 11/14/21 0104  LIPASE 3,538* 165*   No results for input(s): "AMMONIA" in the last 168 hours. CBC: Recent Labs  Lab 11/13/21 0319 11/13/21 2003 11/14/21 0104 11/15/21 0207  WBC 14.4* 12.2* 11.3* 11.7*  NEUTROABS  --   --   --  7.1  HGB 13.4 13.5 12.0* 12.5*  HCT 40.8 41.0 36.5* 37.1*  MCV 86.1 86.1 86.7 84.9  PLT 350 351 325 355   Cardiac Enzymes: Recent Labs  Lab 11/13/21 2003  CKTOTAL 61   BNP: BNP (last 3 results) No results for input(s): "BNP" in the last 8760 hours.  ProBNP (last 3 results) No results for input(s): "PROBNP" in the last 8760 hours.  CBG: No results for input(s): "GLUCAP" in the last 168 hours.     Signed:  Fayrene Helper MD.  [facility] 11/15/2021, 3:00 PM

## 2021-11-17 NOTE — Discharge Summary (Signed)
Physician Discharge Summary  Christian Crawford [name]:[id number] DOB: 10-29-1933 DOA: 11/13/2021  PCP: Christian Hack, MD  Admit date: 11/13/2021 Discharge date: 11/17/2021  Time spent: 40 minutes  Recommendations for Outpatient Follow-up:  Follow outpatient CBC/CMP  Follow outpatient GI for idiopathic pancreatitis Follow INR outpatient  Hematuria here, follow outpatient    Discharge Diagnoses:  Principal Problem:   Acute pancreatitis Active Problems:   Diverticulitis   HTN (hypertension)   Christian Crawford (HCC)   Hypokalemia   Hematuria   Pancreatitis  Discharge Condition: stable  Diet recommendation: heart healthy  Filed Weights   11/13/21 0317  Weight: 90.7 kg    History of present illness:  Christian Crawford is Christian Crawford 88 y.o. male with medical history significant of gout, HLD, HTN  glaucoma, prior hx of etoh induced pancreatitis, Christian Crawford.fib on coumadin.  CT was notable for acute edematous pancreatitis and diverticulitis.  He's improved with supportive care and abx.  Plan is for discharge to home with outpatient GI follow up.  See below for additional details   Hospital Course:  Assessment and Plan: * Acute pancreatitis Unclear cause, he notes abstinence from alcohol for 2 years, RUQ [place] negative for gallstones.  No obvious meds. CT with acute pancreatitis RUQ [place] without cholelithiasis (gallbladder wall thickening noted, thought secondary phenomenon in setting of pancreatitis) Given suspicion for idiopathic pancreatitis, consider outpatient GI follow up   Diverticulitis Inflammation near ileocecal valve concerning for diverticulitis Continue abx with ceftriaxone, flagyl -> discharge with augmentin  HTN (hypertension) Metoprolol, amlodipine  Christian Crawford (HCC) warfarin  Hypokalemia resolved  Hematuria Needs outpatient follow up with PCP (consider urology)     Procedures: none   Consultations: none  Discharge Exam: Vitals:   11/15/21 0509 11/15/21 0953  BP: (!) 173/92 (!)  177/82  Pulse: 79   Resp: 17 (!) 21  Temp:  98 F (36.7 C)  SpO2: 100% 99%   General: No acute distress. Cardiovascular: RRR Lungs: unlabored Abdomen: Soft, nontender, nondistended  Neurological: Alert and oriented 3. Moves all extremities 4 with equal strength. Cranial nerves II through XII grossly intact. Extremities: No clubbing or cyanosis. No edema.  Discharge Instructions   Discharge Instructions     Ambulatory referral to Gastroenterology   Complete by: As directed    Idiopathic pancreatitis   What is the reason for referral?: Other Comment - pancreatitis   Call MD for:  difficulty breathing, headache or visual disturbances   Complete by: As directed    Call MD for:  extreme fatigue   Complete by: As directed    Call MD for:  hives   Complete by: As directed    Call MD for:  persistant dizziness or light-headedness   Complete by: As directed    Call MD for:  persistant nausea and vomiting   Complete by: As directed    Call MD for:  redness, tenderness, or signs of infection (pain, swelling, redness, odor or green/yellow discharge around incision site)   Complete by: As directed    Call MD for:  severe uncontrolled pain   Complete by: As directed    Call MD for:  temperature >100.4   Complete by: As directed    Diet - low sodium heart healthy   Complete by: As directed    Discharge instructions   Complete by: As directed    You were seen for pancreatitis and diverticulitis.  You've improved.  We'll send you home with antibiotics to finish for your diverticulitis.  I'm not clear  what caused your pancreatitis.  This is commonly caused by alcohol (you're abstaining) and gallstones (there were none seen on your ultrasound).  I'm going to send you to see gastroenterology as an outpatient to consider additional workup.   You'll need to follow up your INR over the next few days as your on antibiotics.   You had blood in your urine.  This should be followed up with  your PCP outpatient and possibly urology.    Return for new, recurrent, or worsening symptoms.  Please ask your PCP to request records from this hospitalization so they know what was done and what the next steps will be.   Increase activity slowly   Complete by: As directed       Allergies as of 11/15/2021   No Known Allergies      Medication List     STOP taking these medications    azithromycin 250 MG tablet Commonly known as: ZITHROMAX   citalopram 20 MG tablet Commonly known as: CeleXA       TAKE these medications    acetaminophen 650 MG CR tablet Commonly known as: TYLENOL Take 650 mg by mouth every 8 (eight) hours as needed for pain.   albuterol 108 (90 Base) MCG/ACT inhaler Commonly known as: VENTOLIN HFA Inhale into the lungs.   allopurinol 100 MG tablet Commonly known as: ZYLOPRIM Take 100 mg by mouth 2 (two) times daily.   amLODipine 5 MG tablet Commonly known as: NORVASC Take 5 mg by mouth daily.   amoxicillin-clavulanate 875-125 MG tablet Commonly known as: AUGMENTIN Take 1 tablet by mouth 2 (two) times daily for 7 days.   aspirin EC 81 MG tablet Take 81 mg by mouth daily.   atorvastatin 40 MG tablet Commonly known as: LIPITOR Take 40 mg by mouth daily.   benzonatate 100 MG capsule Commonly known as: TESSALON Take 100 mg by mouth 3 (three) times daily as needed for cough.   FeroSul 325 (65 FE) MG tablet Generic drug: ferrous sulfate Take 325 mg by mouth 2 (two) times daily.   latanoprost 0.005 % ophthalmic solution Commonly known as: XALATAN Place 1 drop into both eyes at bedtime.   Metoprolol Tartrate 75 MG Tabs Take 75 mg by mouth in the morning and at bedtime.   multivitamin with minerals tablet Take 1 tablet by mouth daily.   pantoprazole 40 MG tablet Commonly known as: PROTONIX Take 40 mg by mouth daily.   warfarin 5 MG tablet Commonly known as: COUMADIN Take 1 tablet (5 mg total) by mouth daily at 4 PM.       No  Known Allergies  Follow-up Information     Christian Hack, MD Follow up.   Specialty: Family Medicine Contact information: [address] [place] [address] [phone]         [facility] Follow up.   Specialty: Gastroenterology Contact information: [address] [place] [address] [phone]                 The results of significant diagnostics from this hospitalization (including imaging, microbiology, ancillary and laboratory) are listed below for reference.    Significant Diagnostic Studies: US Abdomen Limited RUQ (LIVER/GB)  Result Date: 11/14/2021 CLINICAL DATA:  Pancreatitis, epigastric pain with nausea vomiting. EXAM: ULTRASOUND ABDOMEN LIMITED RIGHT UPPER QUADRANT COMPARISON:  CT abdomen and pelvis of November 13, 2021 FINDINGS: Gallbladder: No gallstones are visible. Wall thickness at 6 mm. No reported tenderness over the gallbladder.  No pericholecystic fluid. Common bile duct: Diameter: 3 mm Liver: No focal lesion identified. Within normal limits in parenchymal echogenicity. Portal vein is patent on color Doppler imaging with normal direction of blood flow towards the liver. Other: None. IMPRESSION: Gallbladder wall thickening without pericholecystic fluid or cholelithiasis. In the setting of pancreatitis this is more likely Emmalynne Courtney secondary phenomenon. If there are laboratory or clinical parameters that raise concern for acute biliary process as well HIDA scan could be utilized for further assessment. Electronically Signed   By: Donzetta Kohut M.D.   On: 11/14/2021 11:02   DG Chest 2 View  Result Date: 11/13/2021 CLINICAL DATA:  Cough. EXAM: CHEST - 2 VIEW COMPARISON:  Lung bases from abdominal CT earlier today. Chest radiograph 07/30/2019 FINDINGS: The cardiomediastinal contours are normal. Streaky atelectasis in the lower lobes. Pulmonary vasculature is normal. No consolidation, pleural effusion, or pneumothorax. No acute  osseous abnormalities are seen. IMPRESSION: Streaky atelectasis in the lower lobes. Electronically Signed   By: Narda Rutherford M.D.   On: 11/13/2021 19:46   CT Abdomen Pelvis W Contrast  Result Date: 11/13/2021 CLINICAL DATA:  Pancreatitis, acute, severe. EXAM: CT ABDOMEN AND PELVIS WITH CONTRAST TECHNIQUE: Multidetector CT imaging of the abdomen and pelvis was performed using the standard protocol following bolus administration of intravenous contrast. RADIATION DOSE REDUCTION: This exam was performed according to the departmental dose-optimization program which includes automated exposure control, adjustment of the mA and/or kV according to patient size and/or use of iterative reconstruction technique. CONTRAST:  10mL OMNIPAQUE IOHEXOL 300 MG/ML  SOLN COMPARISON:  07/24/2019 FINDINGS: Lower chest: Atherosclerosis including the aorta and coronaries. No acute finding Hepatobiliary: No focal liver abnormality.Full gallbladder. No calcified stone or bile duct dilatation. Pancreas: Peripancreatic edema. The gland it is diffusely atrophic. No necrosis or collection. No venous thrombosis. Spleen: Unremarkable. Adrenals/Urinary Tract: Negative adrenals. No hydronephrosis or stone. Bilateral renal cortical scarring and lobulation. Unremarkable bladder. Stomach/Bowel: Fat stranding near the ileocecal bowel that is mild. Multiple diverticula in this area affecting colon and terminal ileum. No bowel obstruction or perforation. Vascular/Lymphatic: No acute vascular abnormality. Extensive atheromatous calcification of the aorta and branch vessels. No mass or adenopathy. Reproductive:Enlarged prostate measuring 6.3 cm in transverse span. Other: No ascites or pneumoperitoneum. Musculoskeletal: No acute abnormalities. Generalized lumbar spine degeneration. Bilateral hip osteoarthritis worse on the left. IMPRESSION: 1. Acute edematous pancreatitis without collection. 2. Mild inflammation near the ileocecal valve, favor  diverticulitis. 3. Extensive atherosclerosis, including the coronary arteries. Electronically Signed   By: Tiburcio Pea M.D.   On: 11/13/2021 05:36    Microbiology: Recent Results (from the past 240 hour(s))  SARS Coronavirus 2 by RT PCR (hospital order, performed in [facility] hospital lab) *cepheid single result test* Anterior Nasal Swab     Status: None   Collection Time: 11/13/21  8:38 PM   Specimen: Anterior Nasal Swab  Result Value Ref Range Status   SARS Coronavirus 2 by RT PCR NEGATIVE NEGATIVE Final    Comment: (NOTE) SARS-CoV-2 target nucleic acids are NOT DETECTED.  The SARS-CoV-2 RNA is generally detectable in upper and lower respiratory specimens during the acute phase of infection. The lowest concentration of SARS-CoV-2 viral copies this assay can detect is 250 copies / mL. Alder Murri negative result does not preclude SARS-CoV-2 infection and should not be used as the sole basis for treatment or other patient management decisions.  Lessie Manigo negative result may occur with improper specimen collection / handling, submission of specimen other than nasopharyngeal swab, presence of  viral mutation(s) within the areas targeted by this assay, and inadequate number of viral copies (<250 copies / mL). Shardai Star negative result must be combined with clinical observations, patient history, and epidemiological information.  Fact Sheet for Patients:   RoadLapTop.co.za  Fact Sheet for Healthcare Providers: http://kim-miller.com/  This test is not yet approved or  cleared by the [place] FDA and has been authorized for detection and/or diagnosis of SARS-CoV-2 by FDA under an Emergency Use Authorization (EUA).  This EUA will remain in effect (meaning this test can be used) for the duration of the COVID-19 declaration under Section 564(b)(1) of the Act, 21 U.S.C. section 360bbb-3(b)(1), unless the authorization is terminated or revoked sooner.  Performed  at [facility], [address], [place], [place] [address]      Labs: Basic Metabolic Panel: Recent Labs  Lab 11/13/21 0319 11/13/21 2003 11/14/21 0104 11/15/21 0207  NA 138 139 139 139  K 3.4* 3.5 4.1 3.6  CL 102 106 108 106  CO2 24 24 25 24   GLUCOSE 152* 113* 120* 104*  BUN 21 12 14  7*  CREATININE 1.00 0.80 0.92 0.77  CALCIUM 9.2 8.9 8.4* 8.7*  MG  --  1.7  --  1.9  PHOS  --  2.9  --  2.5   Liver Function Tests: Recent Labs  Lab 11/13/21 0319 11/14/21 0104 11/15/21 0207  AST 45* 30 30  ALT 31 29 27   ALKPHOS 117 95 93  BILITOT 1.0 0.8 0.8  PROT 7.5 6.0* 6.3*  ALBUMIN 4.0 3.0* 3.1*   Recent Labs  Lab 11/13/21 0319 11/14/21 0104  LIPASE 3,538* 165*   No results for input(s): "AMMONIA" in the last 168 hours. CBC: Recent Labs  Lab 11/13/21 0319 11/13/21 2003 11/14/21 0104 11/15/21 0207  WBC 14.4* 12.2* 11.3* 11.7*  NEUTROABS  --   --   --  7.1  HGB 13.4 13.5 12.0* 12.5*  HCT 40.8 41.0 36.5* 37.1*  MCV 86.1 86.1 86.7 84.9  PLT 350 351 325 355   Cardiac Enzymes: Recent Labs  Lab 11/13/21 2003  CKTOTAL 61   BNP: BNP (last 3 results) No results for input(s): "BNP" in the last 8760 hours.  ProBNP (last 3 results) No results for input(s): "PROBNP" in the last 8760 hours.  CBG: No results for input(s): "GLUCAP" in the last 168 hours.     Signed:  11/16/21 MD.  [facility] 11/17/2021, 7:28 PM

## 2021-11-21 ENCOUNTER — Other Ambulatory Visit: Payer: Self-pay | Admitting: Family Medicine

## 2021-11-21 ENCOUNTER — Ambulatory Visit
Admission: RE | Admit: 2021-11-21 | Discharge: 2021-11-21 | Disposition: A | Discharge status: Home / Self Care | Payer: Medicare Other | Source: Ambulatory Visit | Attending: Family Medicine | Admitting: Family Medicine

## 2021-11-21 DIAGNOSIS — J40 Bronchitis, not specified as acute or chronic: Secondary | ICD-10-CM

## 2022-10-12 ENCOUNTER — Emergency Department (HOSPITAL_BASED_OUTPATIENT_CLINIC_OR_DEPARTMENT_OTHER)
Admission: EM | Admit: 2022-10-12 | Discharge: 2022-10-12 | Disposition: A | Discharge status: Home / Self Care | Payer: Medicare HMO | Attending: Emergency Medicine | Admitting: Emergency Medicine

## 2022-10-12 ENCOUNTER — Other Ambulatory Visit: Payer: Self-pay

## 2022-10-12 ENCOUNTER — Encounter (HOSPITAL_BASED_OUTPATIENT_CLINIC_OR_DEPARTMENT_OTHER): Payer: Self-pay

## 2022-10-12 DIAGNOSIS — Z7984 Long term (current) use of oral hypoglycemic drugs: Secondary | ICD-10-CM | POA: Diagnosis not present

## 2022-10-12 DIAGNOSIS — Z79899 Other long term (current) drug therapy: Secondary | ICD-10-CM | POA: Diagnosis not present

## 2022-10-12 DIAGNOSIS — I10 Essential (primary) hypertension: Secondary | ICD-10-CM | POA: Insufficient documentation

## 2022-10-12 DIAGNOSIS — I4891 Unspecified atrial fibrillation: Secondary | ICD-10-CM | POA: Diagnosis not present

## 2022-10-12 DIAGNOSIS — N3000 Acute cystitis without hematuria: Secondary | ICD-10-CM | POA: Insufficient documentation

## 2022-10-12 DIAGNOSIS — R739 Hyperglycemia, unspecified: Secondary | ICD-10-CM | POA: Insufficient documentation

## 2022-10-12 DIAGNOSIS — Z7982 Long term (current) use of aspirin: Secondary | ICD-10-CM | POA: Diagnosis not present

## 2022-10-12 DIAGNOSIS — Z7901 Long term (current) use of anticoagulants: Secondary | ICD-10-CM | POA: Insufficient documentation

## 2022-10-12 LAB — URINALYSIS, ROUTINE W REFLEX MICROSCOPIC
Bilirubin Urine: NEGATIVE
Glucose, UA: NEGATIVE mg/dL
Ketones, ur: NEGATIVE mg/dL
Leukocytes,Ua: NEGATIVE
Nitrite: NEGATIVE
Protein, ur: NEGATIVE mg/dL
Specific Gravity, Urine: <1.005 — ABNORMAL LOW (ref 1.005–1.030)
pH: 7 (ref 5.0–8.0)

## 2022-10-12 LAB — CBC WITH DIFFERENTIAL/PLATELET
Abs Immature Granulocytes: 0.02 10*3/uL (ref 0.00–0.07)
Basophils Absolute: 0 10*3/uL (ref 0.0–0.1)
Basophils Relative: 0 %
Eosinophils Absolute: 0.4 10*3/uL (ref 0.0–0.5)
Eosinophils Relative: 5 %
HCT: 40.3 % (ref 39.0–52.0)
Hemoglobin: 13.4 g/dL (ref 13.0–17.0)
Immature Granulocytes: 0 %
Lymphocytes Relative: 27 %
Lymphs Abs: 2.5 10*3/uL (ref 0.7–4.0)
MCH: 28.8 pg (ref 26.0–34.0)
MCHC: 33.3 g/dL (ref 30.0–36.0)
MCV: 86.5 fL (ref 80.0–100.0)
Monocytes Absolute: 0.5 10*3/uL (ref 0.1–1.0)
Monocytes Relative: 6 %
Neutro Abs: 5.8 10*3/uL (ref 1.7–7.7)
Neutrophils Relative %: 62 %
Platelets: 326 10*3/uL (ref 150–400)
RBC: 4.66 MIL/uL (ref 4.22–5.81)
RDW: 13.8 % (ref 11.5–15.5)
WBC: 9.3 10*3/uL (ref 4.0–10.5)
nRBC: 0 % (ref 0.0–0.2)

## 2022-10-12 LAB — COMPREHENSIVE METABOLIC PANEL
ALT: 19 U/L (ref 0–44)
AST: 17 U/L (ref 15–41)
Albumin: 4.3 g/dL (ref 3.5–5.0)
Alkaline Phosphatase: 89 U/L (ref 38–126)
Anion gap: 9 (ref 5–15)
BUN: 18 mg/dL (ref 8–23)
CO2: 27 mmol/L (ref 22–32)
Calcium: 9.5 mg/dL (ref 8.9–10.3)
Chloride: 101 mmol/L (ref 98–111)
Creatinine, Ser: 0.94 mg/dL (ref 0.61–1.24)
GFR, Estimated: >60 mL/min (ref >60)
Glucose, Bld: 479 mg/dL — ABNORMAL HIGH (ref 70–99)
Potassium: 4 mmol/L (ref 3.5–5.1)
Sodium: 137 mmol/L (ref 135–145)
Total Bilirubin: 0.6 mg/dL (ref 0.3–1.2)
Total Protein: 7.5 g/dL (ref 6.5–8.1)

## 2022-10-12 LAB — CBG MONITORING, ED
Glucose-Capillary: 462 mg/dL — ABNORMAL HIGH (ref 70–99)
Glucose-Capillary: 319 mg/dL — ABNORMAL HIGH (ref 70–99)

## 2022-10-12 LAB — PROTIME-INR
INR: 2.4 — ABNORMAL HIGH (ref 0.8–1.2)
Prothrombin Time: 26.4 seconds — ABNORMAL HIGH (ref 11.4–15.2)

## 2022-10-12 MED ORDER — LACTATED RINGERS IV BOLUS
1000.0000 mL | Freq: Once | INTRAVENOUS | Status: AC
  Administered 2022-10-12: 1000 mL via INTRAVENOUS

## 2022-10-12 MED ORDER — CEPHALEXIN 500 MG PO CAPS: 500.0000 mg | ORAL_CAPSULE | Freq: Four times a day (QID) | ORAL | 0 refills | Status: DC

## 2022-10-12 MED ORDER — AMLODIPINE BESYLATE 5 MG PO TABS: 10.0000 mg | ORAL_TABLET | Freq: Every day | ORAL | 1 refills | Status: DC

## 2022-10-12 MED ORDER — INSULIN ASPART 100 UNIT/ML IJ SOLN
5.0000 [IU] | Freq: Once | INTRAMUSCULAR | Status: AC
  Administered 2022-10-12: 5 [IU] via INTRAVENOUS

## 2022-10-12 MED ORDER — METFORMIN HCL 500 MG PO TABS: 500.0000 mg | ORAL_TABLET | Freq: Two times a day (BID) | ORAL | 0 refills | Status: DC

## 2022-10-12 MED ORDER — AMLODIPINE BESYLATE 5 MG PO TABS
5.0000 mg | ORAL_TABLET | Freq: Once | ORAL | Status: AC
  Administered 2022-10-12: 5 mg via ORAL
  Filled 2022-10-12: qty 1

## 2022-10-12 MED ORDER — CEPHALEXIN 250 MG PO CAPS
500.0000 mg | ORAL_CAPSULE | Freq: Once | ORAL | Status: AC
  Administered 2022-10-12: 500 mg via ORAL
  Filled 2022-10-12: qty 2

## 2022-10-12 NOTE — ED Provider Notes (Signed)
[place] EMERGENCY DEPARTMENT AT [facility] Provider Note   CSN: [id number] Arrival date & time: 10/12/22  1201     History  Chief Complaint  Patient presents with   Hyperglycemia    Christian Crawford is a 89 y.o. male.  Patient is an 89 year old male with a past medical history of hypertension and A-fib on Coumadin presenting to the emergency department with hyperglycemia.  The patient states that he has never been diagnosed with diabetes but that his primary doctor gave him an Accu-Chek to monitor his blood sugars at home.  He states for the last few days his blood sugars have been running high and his daughter who is a nurse checked his sugar today and saw that it was 500 and recommended that he come to the emergency department.  He states that he has had increased thirst and frequent urination.  He denies any fevers, nausea, vomiting or abdominal pain.  He denies any recent steroid use or other medication changes.  The history is provided by the patient.  Hyperglycemia      Home Medications Prior to Admission medications   Medication Sig Start Date End Date Taking? Authorizing Provider  cephALEXin (KEFLEX) 500 MG capsule Take 1 capsule (500 mg total) by mouth 4 (four) times daily. 10/12/22  Yes Theresia Lo, Benetta Spar K, DO  metFORMIN (GLUCOPHAGE) 500 MG tablet Take 1 tablet (500 mg total) by mouth 2 (two) times daily with a meal. 10/12/22 11/11/22 Yes Theresia Lo, Iyla Balzarini K, DO  acetaminophen (TYLENOL) 650 MG CR tablet Take 650 mg by mouth every 8 (eight) hours as needed for pain.    [provider]  albuterol (VENTOLIN HFA) 108 (90 Base) MCG/ACT inhaler Inhale into the lungs. 11/12/21   [provider]  allopurinol (ZYLOPRIM) 100 MG tablet Take 100 mg by mouth 2 (two) times daily.    [provider]  amLODipine (NORVASC) 5 MG tablet Take 2 tablets (10 mg total) by mouth daily. 10/12/22 11/11/22  Rexford Maus, DO  aspirin EC 81 MG tablet Take 81  mg by mouth daily.    [provider]  atorvastatin (LIPITOR) 40 MG tablet Take 40 mg by mouth daily.    [provider]  benzonatate (TESSALON) 100 MG capsule Take 100 mg by mouth 3 (three) times daily as needed for cough.    [provider]  FEROSUL 325 (65 Fe) MG tablet Take 325 mg by mouth 2 (two) times daily. 11/01/21   [provider]  latanoprost (XALATAN) 0.005 % ophthalmic solution Place 1 drop into both eyes at bedtime. 06/10/19   [provider]  Metoprolol Tartrate 75 MG TABS Take 75 mg by mouth in the morning and at bedtime.    [provider]  Multiple Vitamins-Minerals (MULTIVITAMIN WITH MINERALS) tablet Take 1 tablet by mouth daily.    [provider]  pantoprazole (PROTONIX) 40 MG tablet Take 40 mg by mouth daily.    [provider]  warfarin (COUMADIN) 5 MG tablet Take 1 tablet (5 mg total) by mouth daily at 4 PM. 07/26/19   Shahmehdi, Gemma Payor, MD      Allergies    Patient has no known allergies.    Review of Systems   Review of Systems  Physical Exam Updated Vital Signs BP (!) 189/90   Pulse 60   Temp 97.9 F (36.6 C) (Oral)   Resp 13   Ht 6\' 1"  (1.854 m)   Wt 90.7 kg   SpO2 100%  BMI 26.39 kg/m  Physical Exam Vitals and nursing note reviewed.  Constitutional:      General: He is not in acute distress.    Appearance: Normal appearance.  HENT:     Head: Normocephalic and atraumatic.     Nose: Nose normal.     Mouth/Throat:     Mouth: Mucous membranes are moist.     Pharynx: Oropharynx is clear.  Eyes:     Extraocular Movements: Extraocular movements intact.     Conjunctiva/sclera: Conjunctivae normal.  Cardiovascular:     Rate and Rhythm: Normal rate and regular rhythm.     Heart sounds: Normal heart sounds.  Pulmonary:     Effort: Pulmonary effort is normal.     Breath sounds: Normal breath sounds.  Abdominal:     General: Abdomen is flat.     Palpations: Abdomen is soft.      Tenderness: There is no abdominal tenderness.  Musculoskeletal:        General: Normal range of motion.     Cervical back: Normal range of motion.  Skin:    General: Skin is warm and dry.  Neurological:     General: No focal deficit present.     Mental Status: He is alert and oriented to person, place, and time.  Psychiatric:        Mood and Affect: Mood normal.        Behavior: Behavior normal.     ED Results / Procedures / Treatments   Labs (all labs ordered are listed, but only abnormal results are displayed) Labs Reviewed  URINALYSIS, ROUTINE W REFLEX MICROSCOPIC - Abnormal; Notable for the following components:      Result Value   Color, Urine COLORLESS (*)    Specific Gravity, Urine <1.005 (*)    Hgb urine dipstick TRACE (*)    Bacteria, UA MANY (*)    All other components within normal limits  COMPREHENSIVE METABOLIC PANEL - Abnormal; Notable for the following components:   Glucose, Bld 479 (*)    All other components within normal limits  PROTIME-INR - Abnormal; Notable for the following components:   Prothrombin Time 26.4 (*)    INR 2.4 (*)    All other components within normal limits  CBG MONITORING, ED - Abnormal; Notable for the following components:   Glucose-Capillary 462 (*)    All other components within normal limits  CBC WITH DIFFERENTIAL/PLATELET  CBG MONITORING, ED    EKG None  Radiology No results found.  Procedures Procedures    Medications Ordered in ED Medications  amLODipine (NORVASC) tablet 5 mg (has no administration in time range)  cephALEXin (KEFLEX) capsule 500 mg (has no administration in time range)  lactated ringers bolus 1,000 mL (1,000 mLs Intravenous New Bag/Given 10/12/22 1416)  insulin aspart (novoLOG) injection 5 Units (5 Units Intravenous Given 10/12/22 1412)    ED Course/ Medical Decision Making/ A&P Clinical Course as of 10/12/22 1518  Sat Oct 12, 2022  1457 INR therapeutic. Bacteria in urine and with increased  urinary frequency and urgency will be treated for UTI.  BP has been consistently elevated here and patient reports majority of his readings at home are over 140 systolic and will have an increase of his amlodipine.  Patient is stable for discharge with outpatient primary care follow-up and was given strict return precautions. [VK]    Clinical Course User Index [VK] Rexford Maus, DO  Medical Decision Making This patient presents to the ED with chief complaint(s) of hyperglycemia with pertinent past medical history of HTN, a fib on Coumadin which further complicates the presenting complaint. The complaint involves an extensive differential diagnosis and also carries with it a high risk of complications and morbidity.    The differential diagnosis includes hyperglycemia hyperglycemic crisis, dehydration, electrolyte abnormality, no infectious symptoms  Additional history obtained: Additional history obtained from N/A Records reviewed prior admission records  ED Course and Reassessment: On patient's arrival to the emergency department he is hypertensive but otherwise hemodynamically stable in no acute distress.  He was initially evaluated by triage and had an Accu-Chek of 462 and had labs performed.  Labs showed hyperglycemia but no evidence of DKA or electrolyte derangement.  Patient will additionally have an INR added on to evaluate for appropriate level.  Independent labs interpretation:  The following labs were independently interpreted: Hyperglycemia without DKA, bacteriuria  Independent visualization of imaging: - N/A  Consultation: - Consulted or discussed management/test interpretation w/ external professional: N/A  Consideration for admission or further workup: Patient has no emergent conditions requiring admission or further work-up at this time and is stable for discharge home with primary care follow-up  Social Determinants of health:  N/A    Amount and/or Complexity of Data Reviewed Labs: ordered.  Risk Prescription drug management.          Final Clinical Impression(s) / ED Diagnoses Final diagnoses:  Hyperglycemia  Uncontrolled hypertension  Acute cystitis without hematuria    Rx / DC Orders ED Discharge Orders          Ordered    cephALEXin (KEFLEX) 500 MG capsule  4 times daily        10/12/22 1516    metFORMIN (GLUCOPHAGE) 500 MG tablet  2 times daily with meals        10/12/22 1516    amLODipine (NORVASC) 5 MG tablet  Daily        10/12/22 1516              Rexford Maus, DO 10/12/22 1518

## 2022-10-12 NOTE — Discharge Instructions (Signed)
You were seen in the emergency department for your high blood sugar.  Your blood sugar was high here but you had no complications from her sugar being high.  We gave you fluids and a dose of insulin to bring your sugar down and I have given you a prescription of metformin that you should start taking every day as prescribed.  Her blood pressure was additionally high here in the emergency department and with it running high at home as well I recommended that you increase your amlodipine to 10 mg daily.  You can keep a log of your blood sugars and your blood pressure daily and bring these to your primary doctor to see if you need further adjustments.  Your urine also showed that you have a urinary tract infection and I have given you antibiotics and you should complete this as prescribed.  You can follow-up with your primary doctor in the next few days to have your symptoms rechecked.  You should return to the emergency department if you are blood sugar is too high to read on your monitor, you have fevers despite the antibiotics, you have severe abdominal or back pain, repetitive vomiting or any other new or concerning symptoms.

## 2022-10-12 NOTE — ED Triage Notes (Signed)
Patient is not a diagnosed diabetic but glucose has been high recently over 500 this morning.  He did eat an oatmeal cream pie 4 hours ago and a mint a few minutes ago.

## 2022-11-25 ENCOUNTER — Emergency Department (HOSPITAL_BASED_OUTPATIENT_CLINIC_OR_DEPARTMENT_OTHER)
Admission: EM | Admit: 2022-11-25 | Discharge: 2022-11-25 | Disposition: A | Discharge status: Home / Self Care | Payer: Medicare HMO | Attending: Internal Medicine | Admitting: Internal Medicine

## 2022-11-25 ENCOUNTER — Other Ambulatory Visit: Payer: Self-pay

## 2022-11-25 ENCOUNTER — Encounter (HOSPITAL_BASED_OUTPATIENT_CLINIC_OR_DEPARTMENT_OTHER): Payer: Self-pay | Admitting: Emergency Medicine

## 2022-11-25 DIAGNOSIS — Z7982 Long term (current) use of aspirin: Secondary | ICD-10-CM | POA: Insufficient documentation

## 2022-11-25 DIAGNOSIS — I10 Essential (primary) hypertension: Secondary | ICD-10-CM | POA: Insufficient documentation

## 2022-11-25 DIAGNOSIS — Z79899 Other long term (current) drug therapy: Secondary | ICD-10-CM | POA: Diagnosis not present

## 2022-11-25 DIAGNOSIS — Z7901 Long term (current) use of anticoagulants: Secondary | ICD-10-CM | POA: Diagnosis not present

## 2022-11-25 LAB — BASIC METABOLIC PANEL
Anion gap: 7 (ref 5–15)
BUN: 12 mg/dL (ref 8–23)
CO2: 26 mmol/L (ref 22–32)
Calcium: 8.6 mg/dL — ABNORMAL LOW (ref 8.9–10.3)
Chloride: 105 mmol/L (ref 98–111)
Creatinine, Ser: 0.76 mg/dL (ref 0.61–1.24)
GFR, Estimated: >60 mL/min (ref >60)
Glucose, Bld: 117 mg/dL — ABNORMAL HIGH (ref 70–99)
Potassium: 3.9 mmol/L (ref 3.5–5.1)
Sodium: 138 mmol/L (ref 135–145)

## 2022-11-25 LAB — CBC
HCT: 39 % (ref 39.0–52.0)
Hemoglobin: 12.5 g/dL — ABNORMAL LOW (ref 13.0–17.0)
MCH: 28.3 pg (ref 26.0–34.0)
MCHC: 32.1 g/dL (ref 30.0–36.0)
MCV: 88.2 fL (ref 80.0–100.0)
Platelets: 350 10*3/uL (ref 150–400)
RBC: 4.42 MIL/uL (ref 4.22–5.81)
RDW: 14.3 % (ref 11.5–15.5)
WBC: 8.8 10*3/uL (ref 4.0–10.5)
nRBC: 0 % (ref 0.0–0.2)

## 2022-11-25 NOTE — ED Triage Notes (Signed)
Pt arrives to ED with c/o high blood pressure readings at home x1 week.

## 2022-11-25 NOTE — Discharge Instructions (Addendum)
You were seen in the ER today for concerns of hypertension. Given that you are not currently experiencing any symptoms such as chest pain, leg swelling, trouble breathing, or sudden vision changes, I would encourage you to follow up with your primary care provider for further management of you blood pressure. The majority of your blood pressure readings were between 130-150s/80-90s here in th ED. Your kidneys appear to be functioning well so if you were to need a new medication added on given your reported history of diabetes, addition of a thiazide diuretic or an ACEI could be beneficial if you are still struggling to achieve control of your blood pressure. Please discuss this with your primary care provider. If concerning symptoms develop as previously stated, please return to the ER.

## 2022-11-25 NOTE — ED Provider Notes (Signed)
[place] [facility] DEPARTMENT AT [facility] Provider Note   CSN: [id number] Arrival date & time: 11/25/22  1251     History Chief Complaint  Patient presents with   Hypertension    Christian Crawford is a 89 y.o. male.  Patient with past history significant for hypertension, atrial fibrillation, and hypokalemia who presents to the emergency department with concerns of high blood pressure readings for the last week or so.  Patient is currently taking amlodipine 5 mg daily and denies any recent changes to his medications.  States that he was trying to see his primary care provider earlier today for evaluation but office was closed due to the holiday.  Denies any chest pain, shortness of breath, vision changes, lower extremity swelling or edema, or any urinary changes.  Patient is currently anticoagulated on warfarin.   Hypertension       Home Medications Prior to Admission medications   Medication Sig Start Date End Date Taking? Authorizing Provider  acetaminophen (TYLENOL) 650 MG CR tablet Take 650 mg by mouth every 8 (eight) hours as needed for pain.    [provider]  albuterol (VENTOLIN HFA) 108 (90 Base) MCG/ACT inhaler Inhale into the lungs. 11/12/21   [provider]  allopurinol (ZYLOPRIM) 100 MG tablet Take 100 mg by mouth 2 (two) times daily.    [provider]  amLODipine (NORVASC) 5 MG tablet Take 2 tablets (10 mg total) by mouth daily. 10/12/22 11/11/22  Rexford Maus, DO  aspirin EC 81 MG tablet Take 81 mg by mouth daily.    [provider]  atorvastatin (LIPITOR) 40 MG tablet Take 40 mg by mouth daily.    [provider]  benzonatate (TESSALON) 100 MG capsule Take 100 mg by mouth 3 (three) times daily as needed for cough.    [provider]  cephALEXin (KEFLEX) 500 MG capsule Take 1 capsule (500 mg total) by mouth 4 (four) times daily. 10/12/22   Rexford Maus, DO  FEROSUL 325 (65 Fe) MG tablet Take  325 mg by mouth 2 (two) times daily. 11/01/21   [provider]  latanoprost (XALATAN) 0.005 % ophthalmic solution Place 1 drop into both eyes at bedtime. 06/10/19   [provider]  metFORMIN (GLUCOPHAGE) 500 MG tablet Take 1 tablet (500 mg total) by mouth 2 (two) times daily with a meal. 10/12/22 11/11/22  Elayne Snare K, DO  Metoprolol Tartrate 75 MG TABS Take 75 mg by mouth in the morning and at bedtime.    [provider]  Multiple Vitamins-Minerals (MULTIVITAMIN WITH MINERALS) tablet Take 1 tablet by mouth daily.    [provider]  pantoprazole (PROTONIX) 40 MG tablet Take 40 mg by mouth daily.    [provider]  warfarin (COUMADIN) 5 MG tablet Take 1 tablet (5 mg total) by mouth daily at 4 PM. 07/26/19   Shahmehdi, Gemma Payor, MD      Allergies    Patient has no known allergies.    Review of Systems   Review of Systems  Neurological:  Negative for weakness and numbness.  All other systems reviewed and are negative.   Physical Exam Updated Vital Signs BP (!) 159/78   Pulse 65   Temp 98.2 F (36.8 C)   Resp 19   Ht 6\' 1"  (1.854 m)   Wt 87.1 kg   SpO2 99%   BMI 25.33 kg/m  Physical Exam Vitals and nursing note reviewed.  Constitutional:  General: He is not in acute distress.    Appearance: He is well-developed.  HENT:     Head: Normocephalic and atraumatic.  Eyes:     Conjunctiva/sclera: Conjunctivae normal.  Cardiovascular:     Rate and Rhythm: Normal rate and regular rhythm.     Heart sounds: No murmur heard. Pulmonary:     Effort: Pulmonary effort is normal. No respiratory distress.     Breath sounds: Normal breath sounds.  Abdominal:     Palpations: Abdomen is soft.     Tenderness: There is no abdominal tenderness.  Musculoskeletal:        General: No swelling.     Cervical back: Neck supple.  Skin:    General: Skin is warm and dry.     Capillary Refill: Capillary refill takes less than 2 seconds.   Neurological:     Mental Status: He is alert.  Psychiatric:        Mood and Affect: Mood normal.     ED Results / Procedures / Treatments   Labs (all labs ordered are listed, but only abnormal results are displayed) Labs Reviewed  CBC - Abnormal; Notable for the following components:      Result Value   Hemoglobin 12.5 (*)    All other components within normal limits  BASIC METABOLIC PANEL - Abnormal; Notable for the following components:   Glucose, Bld 117 (*)    Calcium 8.6 (*)    All other components within normal limits    EKG EKG Interpretation Date/Time:  Monday November 25 2022 13:11:51 EDT Ventricular Rate:  73 PR Interval:  196 QRS Duration:  66 QT Interval:  386 QTC Calculation: 425 R Axis:   58  Text Interpretation: Normal sinus rhythm Normal ECG When compared with ECG of 14-Nov-2021 06:44, Confirmed by Alvester Chou [phone]) on 11/25/2022 3:26:22 PM  Radiology No results found.  Procedures Procedures   Medications Ordered in ED Medications - No data to display  ED Course/ Medical Decision Making/ A&P                               Medical Decision Making Amount and/or Complexity of Data Reviewed Labs: ordered.   This patient presents to the ED for concern of hypertension.  Differential diagnosis includes hypertensive urgency, hypertensive emergency, stroke, ACS, atrial fibrillation   Lab Tests:  I Ordered, and personally interpreted labs.  The pertinent results include: CBC with mild decrease in hemoglobin at 12.5, BMP is unremarkable   Problem List / ED Course:  Patient presents to the emergency department with concerns of hypertension.  History of hypertension managed with 10 mg amlodipine daily. States that he typically will check his blood pressure at home in the kitchen after waking up. Denies any chest pain, shortness of breath, leg swelling, or vision changes. Low concern for hypertensive emergency at this time. No neurological deficits  noted so low concern for stroke. CBC and BMP ordered from triage with no acutely concerning findings. EKG is also unremarkable at this time. Blood pressure improving as patient is remaining at rest. Appears that elevated readings may be due to poor checking of BP at home. Encouraged outpatient PCP follow up as patient is currently on amlodipine 10mg  and may benefit from addition of another medication such as thiazide diuretic in the setting of DM2 per patient report. As patient's BP improved and no symptoms noted, no acute indication for admission is found.  Patient is agreeable with plan for outpatient PCP follow up. Strict return precautions discussed. All questions answered prior to patient discharge.  Final Clinical Impression(s) / ED Diagnoses Final diagnoses:  Asymptomatic hypertension    Rx / DC Orders ED Discharge Orders     None         Smitty Knudsen, PA-C 11/25/22 1558    Terald Sleeper, MD 11/25/22 [phone]

## 2023-01-21 ENCOUNTER — Emergency Department (HOSPITAL_COMMUNITY): Payer: Medicare HMO

## 2023-01-21 ENCOUNTER — Emergency Department (HOSPITAL_COMMUNITY)
Admission: EM | Admit: 2023-01-21 | Discharge: 2023-01-21 | Disposition: A | Discharge status: Home / Self Care | Payer: Medicare HMO | Attending: Emergency Medicine | Admitting: Emergency Medicine

## 2023-01-21 ENCOUNTER — Encounter (HOSPITAL_COMMUNITY): Payer: Self-pay | Admitting: Emergency Medicine

## 2023-01-21 ENCOUNTER — Other Ambulatory Visit: Payer: Self-pay

## 2023-01-21 DIAGNOSIS — Z7982 Long term (current) use of aspirin: Secondary | ICD-10-CM | POA: Diagnosis not present

## 2023-01-21 DIAGNOSIS — I10 Essential (primary) hypertension: Secondary | ICD-10-CM | POA: Insufficient documentation

## 2023-01-21 DIAGNOSIS — K861 Other chronic pancreatitis: Secondary | ICD-10-CM | POA: Insufficient documentation

## 2023-01-21 DIAGNOSIS — Z87891 Personal history of nicotine dependence: Secondary | ICD-10-CM | POA: Insufficient documentation

## 2023-01-21 DIAGNOSIS — Z7984 Long term (current) use of oral hypoglycemic drugs: Secondary | ICD-10-CM | POA: Insufficient documentation

## 2023-01-21 DIAGNOSIS — R112 Nausea with vomiting, unspecified: Secondary | ICD-10-CM | POA: Diagnosis present

## 2023-01-21 DIAGNOSIS — Z79899 Other long term (current) drug therapy: Secondary | ICD-10-CM | POA: Diagnosis not present

## 2023-01-21 DIAGNOSIS — I4891 Unspecified atrial fibrillation: Secondary | ICD-10-CM | POA: Diagnosis not present

## 2023-01-21 DIAGNOSIS — D72829 Elevated white blood cell count, unspecified: Secondary | ICD-10-CM | POA: Diagnosis not present

## 2023-01-21 DIAGNOSIS — K2980 Duodenitis without bleeding: Secondary | ICD-10-CM | POA: Diagnosis not present

## 2023-01-21 LAB — COMPREHENSIVE METABOLIC PANEL
ALT: 65 U/L — ABNORMAL HIGH (ref 0–44)
AST: 146 U/L — ABNORMAL HIGH (ref 15–41)
Albumin: 4.1 g/dL (ref 3.5–5.0)
Alkaline Phosphatase: 102 U/L (ref 38–126)
Anion gap: 13 (ref 5–15)
BUN: 9 mg/dL (ref 8–23)
CO2: 22 mmol/L (ref 22–32)
Calcium: 9.5 mg/dL (ref 8.9–10.3)
Chloride: 104 mmol/L (ref 98–111)
Creatinine, Ser: 0.9 mg/dL (ref 0.61–1.24)
GFR, Estimated: >60 mL/min (ref >60)
Glucose, Bld: 173 mg/dL — ABNORMAL HIGH (ref 70–99)
Potassium: 3.7 mmol/L (ref 3.5–5.1)
Sodium: 139 mmol/L (ref 135–145)
Total Bilirubin: 1.2 mg/dL (ref 0.3–1.2)
Total Protein: 7.1 g/dL (ref 6.5–8.1)

## 2023-01-21 LAB — CBC
HCT: 40.6 % (ref 39.0–52.0)
Hemoglobin: 13.3 g/dL (ref 13.0–17.0)
MCH: 28.3 pg (ref 26.0–34.0)
MCHC: 32.8 g/dL (ref 30.0–36.0)
MCV: 86.4 fL (ref 80.0–100.0)
Platelets: 427 10*3/uL — ABNORMAL HIGH (ref 150–400)
RBC: 4.7 MIL/uL (ref 4.22–5.81)
RDW: 14.3 % (ref 11.5–15.5)
WBC: 19.2 10*3/uL — ABNORMAL HIGH (ref 4.0–10.5)
nRBC: 0 % (ref 0.0–0.2)

## 2023-01-21 LAB — URINALYSIS, ROUTINE W REFLEX MICROSCOPIC
Bilirubin Urine: NEGATIVE
Glucose, UA: NEGATIVE mg/dL
Hgb urine dipstick: NEGATIVE
Ketones, ur: 5 mg/dL — AB
Leukocytes,Ua: NEGATIVE
Nitrite: NEGATIVE
Protein, ur: NEGATIVE mg/dL
Specific Gravity, Urine: 1.023 (ref 1.005–1.030)
pH: 7 (ref 5.0–8.0)

## 2023-01-21 LAB — LIPASE, BLOOD: Lipase: 569 U/L — ABNORMAL HIGH (ref 11–51)

## 2023-01-21 MED ORDER — SODIUM CHLORIDE 0.9 % IV SOLN
1.0000 g | Freq: Once | INTRAVENOUS | Status: AC
  Administered 2023-01-21: 1 g via INTRAVENOUS
  Filled 2023-01-21: qty 10

## 2023-01-21 MED ORDER — SODIUM CHLORIDE 0.9 % IV BOLUS
1000.0000 mL | Freq: Once | INTRAVENOUS | Status: AC
  Administered 2023-01-21: 1000 mL via INTRAVENOUS

## 2023-01-21 MED ORDER — ONDANSETRON HCL 4 MG PO TABS: 4.0000 mg | ORAL_TABLET | ORAL | 0 refills | Status: DC | PRN

## 2023-01-21 MED ORDER — SUCRALFATE 1 G PO TABS
1.0000 g | ORAL_TABLET | Freq: Three times a day (TID) | ORAL | 0 refills | Status: DC
Start: 2023-01-21 — End: 2023-04-22

## 2023-01-21 MED ORDER — IOHEXOL 350 MG/ML SOLN
75.0000 mL | Freq: Once | INTRAVENOUS | Status: AC | PRN
  Administered 2023-01-21: 75 mL via INTRAVENOUS

## 2023-01-21 MED ORDER — PANTOPRAZOLE SODIUM 40 MG PO TBEC: 40.0000 mg | DELAYED_RELEASE_TABLET | Freq: Two times a day (BID) | ORAL | 0 refills | Status: DC

## 2023-01-21 MED ORDER — ONDANSETRON 4 MG PO TBDP
4.0000 mg | ORAL_TABLET | Freq: Once | ORAL | Status: DC | PRN
  Filled 2023-01-21: qty 1

## 2023-01-21 MED ORDER — OXYCODONE HCL 5 MG PO TABS
5.0000 mg | ORAL_TABLET | ORAL | 0 refills | Status: DC | PRN
Start: 2023-01-21 — End: 2023-04-21

## 2023-01-21 NOTE — ED Notes (Signed)
Patient transported to CT 

## 2023-01-21 NOTE — ED Provider Notes (Addendum)
[place] EMERGENCY DEPARTMENT AT [facility] Provider Note  CSN: [id number] Arrival date & time: 01/21/23 4098  Chief Complaint(s) Abdominal Pain, Emesis, and Chest Pain  HPI Christian Crawford is a 89 y.o. male with past medical history as below, significant for A-fib, hypertension, TIA, recurrent pancreatitis, prior alcohol abuse (no recent etoh) who presents to the ED with complaint of epigastric abdominal pain, nausea and vomiting  Symptom onset around 1 AM this morning, epigastric burning, stabbing.  Nonradiating.  He vomited earlier and the pain has since improved.  No fevers.  No recent alcohol use.  No tobacco use.  Reports pain feels similar to prior episodes of pancreatitis.  He is accompanied by family  Past Medical History Past Medical History:  Diagnosis Date   A-fib [facility])    Hypertension    TIA (transient ischemic attack)    Patient Active Problem List   Diagnosis Date Noted   Hematuria 11/14/2021   Pancreatitis 11/14/2021   Acute pancreatitis 11/13/2021   Diverticulitis 11/13/2021   Hypokalemia 11/13/2021   Heavy alcohol use 07/24/2019   Acute alcoholic pancreatitis 07/24/2019   HTN (hypertension) 07/24/2019   A-fib (HCC) 07/24/2019   Home Medication(s) Prior to Admission medications   Medication Sig Start Date End Date Taking? Authorizing Provider  ondansetron (ZOFRAN) 4 MG tablet Take 1 tablet (4 mg total) by mouth every 4 (four) hours as needed for nausea or vomiting. 01/21/23  Yes Tanda Rockers A, DO  oxyCODONE (ROXICODONE) 5 MG immediate release tablet Take 1 tablet (5 mg total) by mouth every 4 (four) hours as needed for severe pain (pain score 7-10). 01/21/23  Yes Tanda Rockers A, DO  pantoprazole (PROTONIX) 40 MG tablet Take 1 tablet (40 mg total) by mouth 2 (two) times daily for 14 days. 01/21/23 02/04/23 Yes Tanda Rockers A, DO  sucralfate (CARAFATE) 1 g tablet Take 1 tablet (1 g total) by mouth with breakfast, with lunch, and with evening meal for  7 days. 01/21/23 01/28/23 Yes Sloan Leiter, DO  acetaminophen (TYLENOL) 650 MG CR tablet Take 650 mg by mouth every 8 (eight) hours as needed for pain.    [provider]  albuterol (VENTOLIN HFA) 108 (90 Base) MCG/ACT inhaler Inhale into the lungs. 11/12/21   [provider]  allopurinol (ZYLOPRIM) 100 MG tablet Take 100 mg by mouth 2 (two) times daily.    [provider]  amLODipine (NORVASC) 5 MG tablet Take 2 tablets (10 mg total) by mouth daily. 10/12/22 11/11/22  Rexford Maus, DO  aspirin EC 81 MG tablet Take 81 mg by mouth daily.    [provider]  atorvastatin (LIPITOR) 40 MG tablet Take 40 mg by mouth daily.    [provider]  benzonatate (TESSALON) 100 MG capsule Take 100 mg by mouth 3 (three) times daily as needed for cough.    [provider]  cephALEXin (KEFLEX) 500 MG capsule Take 1 capsule (500 mg total) by mouth 4 (four) times daily. 10/12/22   Rexford Maus, DO  FEROSUL 325 (65 Fe) MG tablet Take 325 mg by mouth 2 (two) times daily. 11/01/21   [provider]  latanoprost (XALATAN) 0.005 % ophthalmic solution Place 1 drop into both eyes at bedtime. 06/10/19   [provider]  metFORMIN (GLUCOPHAGE) 500 MG tablet Take 1 tablet (500 mg total) by mouth 2 (two) times daily with a meal. 10/12/22 11/11/22  Elayne Snare K, DO  Metoprolol Tartrate 75 MG TABS Take 75  mg by mouth in the morning and at bedtime.    [provider]  Multiple Vitamins-Minerals (MULTIVITAMIN WITH MINERALS) tablet Take 1 tablet by mouth daily.    [provider]  pantoprazole (PROTONIX) 40 MG tablet Take 40 mg by mouth daily.    [provider]  warfarin (COUMADIN) 5 MG tablet Take 1 tablet (5 mg total) by mouth daily at 4 PM. 07/26/19   Shahmehdi, Gemma Payor, MD                                                                                                                                    Past Surgical  History Past Surgical History:  Procedure Laterality Date   EYE SURGERY     had "droopy eye"   Family History History reviewed. No pertinent family history.  Social History Social History   Tobacco Use   Smoking status: Former    Current packs/day: 2.50    Average packs/day: 2.5 packs/day for 40.0 years (100.0 ttl pk-yrs)    Types: Cigarettes   Smokeless tobacco: Never   Tobacco comments:    Pt stated has not smoke in 27 years  Substance Use Topics   Alcohol use: Yes    Comment: 1/4 pint a day-liquor.   Drug use: Never   Allergies Patient has no known allergies.  Review of Systems Review of Systems  Constitutional:  Negative for chills and fever.  Respiratory:  Negative for shortness of breath.   Cardiovascular:  Negative for chest pain and palpitations.  Gastrointestinal:  Positive for abdominal pain, nausea and vomiting.  Genitourinary:  Negative for dysuria and urgency.  Musculoskeletal:  Negative for back pain.  Skin:  Negative for wound.  Neurological:  Negative for headaches.  All other systems reviewed and are negative.   Physical Exam Vital Signs  I have reviewed the triage vital signs BP (!) 133/105   Pulse 91   Temp 99 F (37.2 C) (Oral)   Resp 20   Wt 87.1 kg   SpO2 98%   BMI 25.33 kg/m  Physical Exam Vitals and nursing note reviewed.  Constitutional:      General: He is not in acute distress.    Appearance: He is well-developed.  HENT:     Head: Normocephalic and atraumatic.     Right Ear: External ear normal.     Left Ear: External ear normal.     Mouth/Throat:     Mouth: Mucous membranes are moist.  Eyes:     General: No scleral icterus. Cardiovascular:     Rate and Rhythm: Normal rate and regular rhythm.     Pulses: Normal pulses.     Heart sounds: Normal heart sounds.  Pulmonary:     Effort: Pulmonary effort is normal. No respiratory distress.     Breath sounds: Normal breath sounds.  Abdominal:     General: Abdomen is flat.  Palpations: Abdomen is soft.     Tenderness: There is abdominal tenderness in the epigastric area. There is no guarding or rebound.  Musculoskeletal:     Cervical back: No rigidity.     Right lower leg: No edema.     Left lower leg: No edema.  Skin:    General: Skin is warm and dry.     Capillary Refill: Capillary refill takes less than 2 seconds.  Neurological:     Mental Status: He is alert.  Psychiatric:        Mood and Affect: Mood normal.        Behavior: Behavior normal.     ED Results and Treatments Labs (all labs ordered are listed, but only abnormal results are displayed) Labs Reviewed  LIPASE, BLOOD - Abnormal; Notable for the following components:      Result Value   Lipase 569 (*)    All other components within normal limits  COMPREHENSIVE METABOLIC PANEL - Abnormal; Notable for the following components:   Glucose, Bld 173 (*)    AST 146 (*)    ALT 65 (*)    All other components within normal limits  CBC - Abnormal; Notable for the following components:   WBC 19.2 (*)    Platelets 427 (*)    All other components within normal limits  URINALYSIS, ROUTINE W REFLEX MICROSCOPIC - Abnormal; Notable for the following components:   Ketones, ur 5 (*)    All other components within normal limits                                                                                                                          Radiology US Abdomen Limited RUQ (LIVER/GB)  Result Date: 01/21/2023 CLINICAL DATA:  Right upper quadrant pain EXAM: ULTRASOUND ABDOMEN LIMITED RIGHT UPPER QUADRANT COMPARISON:  Ultrasound 11/14/2021.  CT earlier 01/21/2023. FINDINGS: Gallbladder: Dilated gallbladder with significant wall thickening and some edema. No shadowing stones. No reported sonographic Murphy's sign. Common bile duct: Diameter: 5 mm Liver: No focal lesion identified. Within normal limits in parenchymal echogenicity. Portal vein is patent on color Doppler imaging with normal direction  of blood flow towards the liver. Other: None. IMPRESSION: Dilated gallbladder with wall thickening and edema but no stones or ductal dilatation. Please correlate for any known history. Appearance with similar going back to the study of August 2023. If needed additional imaging evaluation with MRI with and without contrast and MRCP versus HIDA scan can be considered. Electronically Signed   By: Karen Kays M.D.   On: 01/21/2023 13:24   CT ABDOMEN PELVIS W CONTRAST  Result Date: 01/21/2023 CLINICAL DATA:  Epigastric pain.  Pancreatitis. EXAM: CT ABDOMEN AND PELVIS WITH CONTRAST TECHNIQUE: Multidetector CT imaging of the abdomen and pelvis was performed using the standard protocol following bolus administration of intravenous contrast. RADIATION DOSE REDUCTION: This exam was performed according to the departmental dose-optimization program which includes automated exposure control, adjustment of the mA and/or  kV according to patient size and/or use of iterative reconstruction technique. CONTRAST:  75mL OMNIPAQUE IOHEXOL 350 MG/ML SOLN COMPARISON:  CT scan abdomen and pelvis from 11/13/2021. FINDINGS: Lower chest: There are dependent atelectatic changes in the visualized lung bases. No overt consolidation. No pleural effusion. The heart is normal in size. No pericardial effusion. Hepatobiliary: The liver is normal in size. Non-cirrhotic configuration. No suspicious mass. No intrahepatic or extrahepatic bile duct dilation. The gallbladder is distended measuring up to 4.3 cm in diameter. There is diffuse circumferential wall thickening/edema with mild mucosal hyperattenuation. There is small amount of fluid near the fundus of the gallbladder. No significant pericholecystic fat stranding. Findings are equivocal for acute cholecystitis. Further evaluation with dedicated right upper quadrant ultrasound examination is recommended. Pancreas: Redemonstration of mildly atrophic pancreas with dilation of the main pancreatic  duct measuring up to 5 mm in the body/tail region. No significant interval change. There is subtle heterogeneity of the pancreatic head/uncinate process and minimal surrounding fat stranding. Findings are significantly less pronounced when compared to the prior exam from 11/13/2021. Redemonstration of an approximately 5 mm hypoattenuating focus in the left posterior aspect of the uncinate process, unchanged. Spleen: Within normal limits. No focal lesion. Adrenals/Urinary Tract: Adrenal glands are unremarkable. No suspicious renal mass. No hydronephrosis. No renal or ureteric calculi. Unremarkable urinary bladder. Stomach/Bowel: No disproportionate dilation of the small or large bowel loops. Unremarkable appendix. There is mild circumferential thickening of the 2/3 part of duodenum with mild-to-moderate surrounding fat stranding. These findings are new since the prior study. Findings are concerning for duodenitis. There are multiple diverticula throughout the colon, without imaging signs of diverticulitis. Vascular/Lymphatic: No ascites or pneumoperitoneum. No abdominal or pelvic lymphadenopathy, by size criteria. No aneurysmal dilation of the major abdominal arteries. There are moderate peripheral atherosclerotic vascular calcifications of the aorta and its major branches. Reproductive: Enlarged prostate. Symmetric seminal vesicles. Other: There is a tiny fat containing umbilical hernia. The soft tissues and abdominal wall are otherwise unremarkable. Musculoskeletal: No suspicious osseous lesions. There are mild multilevel degenerative changes in the visualized spine. IMPRESSION: *Circumferential thickening of the second and third part of duodenum with surrounding fat stranding, concerning for duodenitis. There is subtle fat stranding surrounding the pancreatic head/uncinate process, suggesting pancreatitis; however, the pancreatic involvement may be secondary to adjacent duodenitis. Correlate clinically. *Equivocal  findings of acute cholecystitis, as discussed above. Further evaluation with dedicated right upper quadrant ultrasound examination is recommended. There is no calcified gallstone or choledocholithiasis. No intra or extrahepatic bile duct dilation. *Multiple other nonacute observations, as described above. Electronically Signed   By: Jules Schick M.D.   On: 01/21/2023 10:16    Pertinent labs & imaging results that were available during my care of the patient were reviewed by me and considered in my medical decision making (see MDM for details).  Medications Ordered in ED Medications  ondansetron (ZOFRAN-ODT) disintegrating tablet 4 mg (has no administration in time range)  sodium chloride 0.9 % bolus 1,000 mL (0 mLs Intravenous Stopped 01/21/23 1104)  iohexol (OMNIPAQUE) 350 MG/ML injection 75 mL (75 mLs Intravenous Contrast Given 01/21/23 0952)  cefTRIAXone (ROCEPHIN) 1 g in sodium chloride 0.9 % 100 mL IVPB (0 g Intravenous Stopped 01/21/23 1214)  Procedures Procedures  (including critical care time)  Medical Decision Making / ED Course    Medical Decision Making:    Christian Crawford is a 89 y.o. male with past medical history as below, significant for A-fib, hypertension, TIA, recurrent pancreatitis, prior alcohol abuse who presents to the ED with complaint of epigastric abdominal pain, nausea and vomiting. The complaint involves an extensive differential diagnosis and also carries with it a high risk of complications and morbidity.  Serious etiology was considered. Ddx includes but is not limited to: Differential diagnosis includes but is not exclusive to acute cholecystitis, intrathoracic causes for epigastric abdominal pain, gastritis, duodenitis, pancreatitis, small bowel or large bowel obstruction, abdominal aortic aneurysm, hernia, gastritis,  etc.   Complete initial physical exam performed, notably the patient  was no acute distress, sitting upright on stretcher, abdomen is not peritoneal.    Reviewed and confirmed nursing documentation for past medical history, family history, social history.  Vital signs reviewed.        Patient with history of recurrent pancreatitis.  Labs obtained, lipase is elevated 569, he also has leukocytosis 19.2.  No fever.  His pain is greatly improved and nausea is improved following Zofran.  Will get CT imaging to assess for abnormality or complication associated with his presumed pancreatitis.  Labs reviewed, lipase is elevated, CBC with elevated leukocytosis.  He has no fever.  He was vomiting just prior to arrival, favor this is perhaps due to stress response in setting of vomiting and pain, afebrile. HDS.  CT imaging concerning for duodenitis, pancreatitis.  Ultrasound shows chronic changes of the gallbladder, no cholecystitis fortunately. [place] with similar abnormalities noted during prior evaluations a/w recurrent pancreatitis. -murphy on exam, he has mild liver enzyme elev, with normal tbili and alk phos, no stone or ductal dilation noted on imaging. Cholecystitis seems less likely at this time.    His pain is improved, no nausea.  Tolerant p.o. without difficulty. Feeling much better, did not require analgesia while in the ED and nausea resolved  without anti-emetic.   Admission was recommended for likely pancreatitis, duodenitis, with concurrent leukocytosis.Marland Kitchen He prefers to go home and follow-up with gastroenterologist outpatient setting.  Reports he will call PCP tomorrow to help arrange f/u. Family will assist who are at bedside. Given on-call GI Dr Rhea Belton. Will start on analgesics, antiemetic, clear liquid diet with directions to advance as tolerated gradually with additional emphasis on rehydration, PPI increased to BID. Strict return precautions were emphasized.      The patient improved  significantly and was discharged in stable condition. Detailed discussions were had with the patient regarding current findings, and need for close f/u with PCP or on call doctor. The patient has been instructed to return immediately if the symptoms worsen in any way for re-evaluation. Patient verbalized understanding and is in agreement with current care plan. All questions answered prior to discharge.              Additional history obtained: -Additional history obtained from family -External records from outside source obtained and reviewed including: Chart review including previous notes, labs, imaging, consultation notes including  Prior ED visits, prior admission, prior labs and imaging   Lab Tests: -I ordered, reviewed, and interpreted labs.   The pertinent results include:   Labs Reviewed  LIPASE, BLOOD - Abnormal; Notable for the following components:      Result Value   Lipase 569 (*)    All other components within normal limits  COMPREHENSIVE  METABOLIC PANEL - Abnormal; Notable for the following components:   Glucose, Bld 173 (*)    AST 146 (*)    ALT 65 (*)    All other components within normal limits  CBC - Abnormal; Notable for the following components:   WBC 19.2 (*)    Platelets 427 (*)    All other components within normal limits  URINALYSIS, ROUTINE W REFLEX MICROSCOPIC - Abnormal; Notable for the following components:   Ketones, ur 5 (*)    All other components within normal limits    Notable for lipase+, wbc +, LFT's +  EKG   EKG Interpretation Date/Time:  Tuesday January 21 2023 04:24:57 EDT Ventricular Rate:  98 PR Interval:  192 QRS Duration:  72 QT Interval:  374 QTC Calculation: 477 R Axis:   71  Text Interpretation: Normal sinus rhythm Septal infarct , age undetermined Abnormal ECG When compared with ECG of 25-Nov-2022 13:11, PREVIOUS ECG IS PRESENT no stemi Confirmed by Tanda Rockers ([id number]) on 01/21/2023 8:37:09 AM         Imaging  Studies ordered: I ordered imaging studies including CTAP, [place] ruq I independently visualized the following imaging with scope of interpretation limited to determining acute life threatening conditions related to emergency care; findings noted above I independently visualized and interpreted imaging. I agree with the radiologist interpretation   Medicines ordered and prescription drug management: Meds ordered this encounter  Medications   ondansetron (ZOFRAN-ODT) disintegrating tablet 4 mg   sodium chloride 0.9 % bolus 1,000 mL   iohexol (OMNIPAQUE) 350 MG/ML injection 75 mL   cefTRIAXone (ROCEPHIN) 1 g in sodium chloride 0.9 % 100 mL IVPB    Order Specific Question:   Antibiotic Indication:    Answer:   Intra-abdominal   pantoprazole (PROTONIX) 40 MG tablet    Sig: Take 1 tablet (40 mg total) by mouth 2 (two) times daily for 14 days.    Dispense:  28 tablet    Refill:  0   ondansetron (ZOFRAN) 4 MG tablet    Sig: Take 1 tablet (4 mg total) by mouth every 4 (four) hours as needed for nausea or vomiting.    Dispense:  12 tablet    Refill:  0   oxyCODONE (ROXICODONE) 5 MG immediate release tablet    Sig: Take 1 tablet (5 mg total) by mouth every 4 (four) hours as needed for severe pain (pain score 7-10).    Dispense:  12 tablet    Refill:  0   sucralfate (CARAFATE) 1 g tablet    Sig: Take 1 tablet (1 g total) by mouth with breakfast, with lunch, and with evening meal for 7 days.    Dispense:  21 tablet    Refill:  0    -I have reviewed the patients home medicines and have made adjustments as needed   Consultations Obtained: na   Cardiac Monitoring: The patient was maintained on a cardiac monitor.  I personally viewed and interpreted the cardiac monitored which showed an underlying rhythm of: NSR Continuous pulse oximetry interpreted by myself, 99% on ra.    Social Determinants of Health:  Diagnosis or treatment significantly limited by social determinants of health: former  smoker   Reevaluation: After the interventions noted above, I reevaluated the patient and found that they have resolved  Co morbidities that complicate the patient evaluation  Past Medical History:  Diagnosis Date   A-fib (HCC)    Hypertension    TIA (transient ischemic attack)  Dispostion: Disposition decision including need for hospitalization was considered, and patient discharged from emergency department.    Final Clinical Impression(s) / ED Diagnoses Final diagnoses:  Chronic pancreatitis, unspecified pancreatitis type (HCC)  Nausea and vomiting, unspecified vomiting type  Duodenitis        Sloan Leiter, DO 01/21/23 1735    Sloan Leiter, DO 01/21/23 2008

## 2023-01-21 NOTE — ED Notes (Signed)
Pt unable to void at this time. 

## 2023-01-21 NOTE — Discharge Instructions (Addendum)
Please follow-up with gastroenterology and your primary care doctor.  Please follow a clear liquid diet as outlined on paperwork.  Please return for any worsening worrisome symptoms such as severe abdominal pain, worsening nausea and vomiting, fevers, yellowing of skin, any worsening or worrisome symptoms otherwise.  Your Pantoprazole/Protonix was adjusted to twice daily for the next 2 weeks

## 2023-01-21 NOTE — ED Triage Notes (Signed)
Pt in with upper abdominal pain and n/v since 0100. Hx of pancreatitis, denies any ETOH use

## 2023-01-23 ENCOUNTER — Ambulatory Visit: Payer: Medicare HMO | Admitting: Gastroenterology

## 2023-01-23 ENCOUNTER — Encounter: Payer: Self-pay | Admitting: Gastroenterology

## 2023-01-23 VITALS — BP 150/82 | HR 86 | Ht 73.0 in | Wt 192.0 lb

## 2023-01-23 DIAGNOSIS — K2980 Duodenitis without bleeding: Secondary | ICD-10-CM

## 2023-01-23 DIAGNOSIS — K859 Acute pancreatitis without necrosis or infection, unspecified: Secondary | ICD-10-CM | POA: Diagnosis not present

## 2023-01-23 DIAGNOSIS — R748 Abnormal levels of other serum enzymes: Secondary | ICD-10-CM

## 2023-01-23 NOTE — Progress Notes (Signed)
Chief Complaint: Nausea/vomiting, pancreatitis   Referring Provider:     Ellyn Hack, MD   HPI:     Christian Crawford is a 89 y.o. male r with a history of A-fib (on Coumadin, ASA), HTN, TIA (on ASA), diabetes, EtOH pancreatitis (no recent EtOH), GERD, referred to the Gastroenterology Clinic for evaluation of nausea/vomiting and follow-up from ER earlier this week.  He was seen in the ER on 01/21/2023 with epigastric pain and nausea/vomiting. - Lipase 569 - Normal BMP with BUN/creatinine 9/0.9 - AST/ALT 146/65, ALP 102, T. bili 1.2.  Previous normal liver enzymes on 11/25/2022 as below. - WBC 19.2, PLT 427, normal H/H at 13/41 - CT A/P: Distended GB up to 4.3 cm in diameter and diffuse circumferential wall thickening with mild mucosal hyperattenuation.  Mildly atrophic pancreas with dilation of the main PD up to 5 mm in the body/tail, no significant interval change.  Subtle heterogeneity of the pancreatic head/uncinate process and minimal surrounding fat stranding, less pronounced compared with exam on 11/13/2021.  Unchanged 5 mm hypoattenuating focus in the left uncinate process.  Mild circumferential thickening in the duodenum c/w duodenitis - RUQ [place]: Dilated GB with wall thickening and edema but no stones or duct dilation - Was treated with Zofran, ceftriaxone, IV fluid with overall improvement and discharged home with Zofran, Protonix, Carafate, Roxicodone.   Today, he states sxs are resolving. No longer with abdominal pain and vomiting has resolved. Tolerating liquid, bland diet. No lower GI sxs; no hematochezia, melena, steatorrhea.    Prior to most recent episode, he had made dietary modifications as he was recently diagnosed with diabetes and started on Metformin a few months ago.   Quit drinking 3+ years ago and no recent relapse. Did have a previous Hospital admission in 10/2021 with acute pancreatitis with peak lipase 3538.  Was evaluated with ultrasound, CT, and  responded to conservative therapy.   Past Medical History:  Diagnosis Date   A-fib (HCC)    Hypertension    TIA (transient ischemic attack)      Past Surgical History:  Procedure Laterality Date   EYE SURGERY     had "droopy eye"   Family History  Problem Relation Age of Onset   Colon cancer Brother    Esophageal cancer Neg Hx    Liver disease Neg Hx    Social History   Tobacco Use   Smoking status: Former    Current packs/day: 2.50    Average packs/day: 2.5 packs/day for 40.0 years (100.0 ttl pk-yrs)    Types: Cigarettes   Smokeless tobacco: Never   Tobacco comments:    Pt stated has not smoke in 27 years  Vaping Use   Vaping status: Never Used  Substance Use Topics   Alcohol use: Yes    Comment: 1/4 pint a day-liquor.   Drug use: Never   Current Outpatient Medications  Medication Sig Dispense Refill   acetaminophen (TYLENOL) 650 MG CR tablet Take 650 mg by mouth every 8 (eight) hours as needed for pain.     albuterol (VENTOLIN HFA) 108 (90 Base) MCG/ACT inhaler Inhale into the lungs.     allopurinol (ZYLOPRIM) 100 MG tablet Take 100 mg by mouth 2 (two) times daily.     aspirin EC 81 MG tablet Take 81 mg by mouth daily.     atorvastatin (LIPITOR) 40 MG tablet Take 40 mg by mouth daily.  benzonatate (TESSALON) 100 MG capsule Take 100 mg by mouth 3 (three) times daily as needed for cough.     cephALEXin (KEFLEX) 500 MG capsule Take 1 capsule (500 mg total) by mouth 4 (four) times daily. 20 capsule 0   FEROSUL 325 (65 Fe) MG tablet Take 325 mg by mouth 2 (two) times daily.     latanoprost (XALATAN) 0.005 % ophthalmic solution Place 1 drop into both eyes at bedtime.     Metoprolol Tartrate 75 MG TABS Take 75 mg by mouth in the morning and at bedtime.     Multiple Vitamins-Minerals (MULTIVITAMIN WITH MINERALS) tablet Take 1 tablet by mouth daily.     ondansetron (ZOFRAN) 4 MG tablet Take 1 tablet (4 mg total) by mouth every 4 (four) hours as needed for nausea or  vomiting. 12 tablet 0   oxyCODONE (ROXICODONE) 5 MG immediate release tablet Take 1 tablet (5 mg total) by mouth every 4 (four) hours as needed for severe pain (pain score 7-10). 12 tablet 0   pantoprazole (PROTONIX) 40 MG tablet Take 40 mg by mouth daily.     pantoprazole (PROTONIX) 40 MG tablet Take 1 tablet (40 mg total) by mouth 2 (two) times daily for 14 days. 28 tablet 0   sucralfate (CARAFATE) 1 g tablet Take 1 tablet (1 g total) by mouth with breakfast, with lunch, and with evening meal for 7 days. 21 tablet 0   warfarin (COUMADIN) 5 MG tablet Take 1 tablet (5 mg total) by mouth daily at 4 PM. 30 tablet 1   amLODipine (NORVASC) 5 MG tablet Take 2 tablets (10 mg total) by mouth daily. 30 tablet 1   metFORMIN (GLUCOPHAGE) 500 MG tablet Take 1 tablet (500 mg total) by mouth 2 (two) times daily with a meal. 60 tablet 0   No current facility-administered medications for this visit.   No Known Allergies   Review of Systems: All systems reviewed and negative except where noted in HPI.     Physical Exam:    Wt Readings from Last 3 Encounters:  01/23/23 192 lb (87.1 kg)  01/21/23 192 lb 0.3 oz (87.1 kg)  11/25/22 192 lb (87.1 kg)    BP (!) 150/82   Pulse 86   Ht 6\' 1"  (1.854 m)   Wt 192 lb (87.1 kg)   BMI 25.33 kg/m  Constitutional:  Pleasant, in no acute distress. Psychiatric: Normal mood and affect. Behavior is normal. Cardiovascular: Normal rate, regular rhythm. No edema Pulmonary/chest: Effort normal and breath sounds normal. No wheezing, rales or rhonchi. Abdominal: Soft, nondistended, nontender. Bowel sounds active throughout. There are no masses palpable. No hepatomegaly. Neurological: Alert and oriented to person place and time. Skin: Skin is warm and dry. No rashes noted.   ASSESSMENT AND PLAN;   1) Pancreatitis 2) Duodenitis 3) Epigastric pain-resolved 4) Nausea/vomiting-resolved History of recurrent pancreatitis in the past. No EtOH in the last 3 years.   Based on most recent imaging, I am suspicious that he has some underlying chronic pancreatitis with superimposed acute inflammatory change based on CT and elevated lipase.  Has responded to conservative management.     Etiology for pancreatitis discussed today.  Does have a history of EtOH use, but no EtOH in the last 3 years.  Quit smoking 27 years ago.  Reviewed medication list for potential culprits of pancreatitis, which includes acetaminophen (class II) atorvastatin, metformin (both class III).  Also discussed the possibility of malignant etiology based on imaging findings with plan  for the following:  - MRI/MRCP in 4 weeks to allow for healing of the acute inflammatory change - Discussed the role of EGD for evaluation of duodenitis.  He would like to hold off for the time being given overall clinical improvement - Additionally discussed the role of EUS to rule out malignant etiology, pancreatic divisum, etc.  Based on age and other comorbidities, he would like to proceed with less invasive MRI/MRCP to start - Continue slowly advancing diet as tolerated - No chronic abdominal pain or steatorrhea to warrant initiating pancreatic enzymes at this juncture  5) Elevated liver enzymes - Repeat in 4 weeks to ensure returning back to normal  6) Leukocytosis - Suspect reactive from acute inflammation as above.  Repeat in 4 weeks to ensure returning back to normal   RTC in 3 months or sooner prn   Shellia Cleverly, DO, [facility]  01/23/2023, 8:49 AM   Ellyn Hack, MD

## 2023-01-23 NOTE — Patient Instructions (Addendum)
_______________________________________________________  If your blood pressure at your visit was 140/90 or greater, please contact your primary care physician to follow up on this. _______________________________________________________  If you are age 87 or older, your body mass index should be between 23-30. Your Body mass index is 25.33 kg/m. If this is out of the aforementioned range listed, please consider follow up with your Primary Care Provider. ________________________________________________________  The [place] providers would like to encourage you to use [facility] to communicate with providers for non-urgent requests or questions.  Due to long hold times on the telephone, sending your provider a message by [facility] may be a faster and more efficient way to get a response.  Please allow 48 business hours for a response.  Please remember that this is for non-urgent requests.  _______________________________________________________  Bonita Quin have been scheduled for an MRI/MRCP at [facility] (1st floor radiation department) on 02-24-23. Your appointment time is 8am. Please arrive to admitting (at main entrance of the hospital) 30 minutes prior to your appointment time for registration purposes. Please make certain not to have anything to eat or drink 4 hours prior to your test. In addition, if you have any metal in your body, have a pacemaker or defibrillator, please be sure to let your ordering physician know. This test typically takes 45 minutes to 1 hour to complete. Should you need to reschedule, please call [phone] to do so.  Due to recent changes in healthcare laws, you may see the results of your imaging and laboratory studies on MyChart before your provider has had a chance to review them.  We understand that in some cases there may be results that are confusing or concerning to you. Not all laboratory results come back in the same time frame and the provider may be waiting  for multiple results in order to interpret others.  Please give [place] 48 hours in order for your provider to thoroughly review all the results before contacting the office for clarification of your results.   It was a pleasure to see you today!  Vito Cirigliano, D.O.

## 2023-02-24 ENCOUNTER — Other Ambulatory Visit: Payer: Self-pay | Admitting: Gastroenterology

## 2023-02-24 ENCOUNTER — Ambulatory Visit (HOSPITAL_COMMUNITY)
Admission: RE | Admit: 2023-02-24 | Discharge: 2023-02-24 | Disposition: A | Discharge status: Home / Self Care | Payer: Medicare HMO | Source: Ambulatory Visit | Attending: Gastroenterology | Admitting: Gastroenterology

## 2023-02-24 DIAGNOSIS — K859 Acute pancreatitis without necrosis or infection, unspecified: Secondary | ICD-10-CM

## 2023-02-24 DIAGNOSIS — K2980 Duodenitis without bleeding: Secondary | ICD-10-CM | POA: Insufficient documentation

## 2023-02-24 MED ORDER — GADOBUTROL 1 MMOL/ML IV SOLN
7.0000 mL | Freq: Once | INTRAVENOUS | Status: AC | PRN
  Administered 2023-02-24: 7 mL via INTRAVENOUS

## 2023-04-10 ENCOUNTER — Telehealth: Payer: Self-pay | Admitting: Gastroenterology

## 2023-04-10 NOTE — Telephone Encounter (Signed)
Patient with hx pancreatitis seen in office 12/2022.  He calls today stating that he has been having intermittent periumbilical abdominal pain/burning over the last several weeks. He does have episodes of vomiting at times (last vomiting episode was this past Tuesday). He denies any fever, diarrhea/change in bowels. States he feels ok today. He wonders if he can be seen sooner in clinic than his previously scheduled 05/14/23 appointment.   Review of last imaging note on file indicates we will discuss follow up options for dilated pancreatic duct at his upcoming visit.  Dr Barron Alvine did have a cancellation for tomorrow, 04/11/23, so patient has taken this appointment. He is advised that should he develop severe abdominal pain, nausea/vomiting and unable to keep anything down, fever, he should present to the emergency room sooner. He verbalizes understanding.

## 2023-04-10 NOTE — Telephone Encounter (Signed)
Inbound call from patient stating he has been having abdominal pain. Requesting to know if he is able to be seen sooner than 2/19. Please advise, thank you.

## 2023-04-11 ENCOUNTER — Encounter: Payer: Self-pay | Admitting: Gastroenterology

## 2023-04-11 ENCOUNTER — Ambulatory Visit: Payer: Medicare HMO | Admitting: Gastroenterology

## 2023-04-11 ENCOUNTER — Other Ambulatory Visit (INDEPENDENT_AMBULATORY_CARE_PROVIDER_SITE_OTHER): Payer: Medicare HMO

## 2023-04-11 VITALS — BP 122/70 | HR 97 | Ht 73.0 in | Wt 175.0 lb

## 2023-04-11 DIAGNOSIS — R112 Nausea with vomiting, unspecified: Secondary | ICD-10-CM | POA: Diagnosis not present

## 2023-04-11 DIAGNOSIS — R1013 Epigastric pain: Secondary | ICD-10-CM | POA: Diagnosis not present

## 2023-04-11 DIAGNOSIS — Z8719 Personal history of other diseases of the digestive system: Secondary | ICD-10-CM | POA: Diagnosis not present

## 2023-04-11 DIAGNOSIS — K859 Acute pancreatitis without necrosis or infection, unspecified: Secondary | ICD-10-CM

## 2023-04-11 DIAGNOSIS — R6881 Early satiety: Secondary | ICD-10-CM

## 2023-04-11 LAB — CBC WITH DIFFERENTIAL/PLATELET
Basophils Absolute: 0.1 10*3/uL (ref 0.0–0.1)
Basophils Relative: 0.4 % (ref 0.0–3.0)
Eosinophils Absolute: 0 10*3/uL (ref 0.0–0.7)
Eosinophils Relative: 0.1 % (ref 0.0–5.0)
HCT: 37.3 % — ABNORMAL LOW (ref 39.0–52.0)
Hemoglobin: 11.9 g/dL — ABNORMAL LOW (ref 13.0–17.0)
Lymphocytes Relative: 12.9 % (ref 12.0–46.0)
Lymphs Abs: 2.3 10*3/uL (ref 0.7–4.0)
MCHC: 32 g/dL (ref 30.0–36.0)
MCV: 84.9 fL (ref 78.0–100.0)
Monocytes Absolute: 1.8 10*3/uL — ABNORMAL HIGH (ref 0.1–1.0)
Monocytes Relative: 10.2 % (ref 3.0–12.0)
Neutro Abs: 13.5 10*3/uL — ABNORMAL HIGH (ref 1.4–7.7)
Neutrophils Relative %: 76.4 % (ref 43.0–77.0)
Platelets: 605 10*3/uL — ABNORMAL HIGH (ref 150.0–400.0)
RBC: 4.39 Mil/uL (ref 4.22–5.81)
RDW: 14.9 % (ref 11.5–15.5)
WBC: 17.7 10*3/uL — ABNORMAL HIGH (ref 4.0–10.5)

## 2023-04-11 LAB — COMPREHENSIVE METABOLIC PANEL
ALT: 10 U/L (ref 0–53)
AST: 15 U/L (ref 0–37)
Albumin: 3.8 g/dL (ref 3.5–5.2)
Alkaline Phosphatase: 120 U/L — ABNORMAL HIGH (ref 39–117)
BUN: 15 mg/dL (ref 6–23)
CO2: 26 meq/L (ref 19–32)
Calcium: 9.3 mg/dL (ref 8.4–10.5)
Chloride: 102 meq/L (ref 96–112)
Creatinine, Ser: 1.03 mg/dL (ref 0.40–1.50)
GFR: 65.11 mL/min (ref >60.00)
Glucose, Bld: 153 mg/dL — ABNORMAL HIGH (ref 70–99)
Potassium: 3.7 meq/L (ref 3.5–5.1)
Sodium: 138 meq/L (ref 135–145)
Total Bilirubin: 0.4 mg/dL (ref 0.2–1.2)
Total Protein: 7.1 g/dL (ref 6.0–8.3)

## 2023-04-11 LAB — LIPASE: Lipase: 118 U/L — ABNORMAL HIGH (ref 11.0–59.0)

## 2023-04-11 LAB — AMYLASE: Amylase: 66 U/L (ref 27–131)

## 2023-04-11 MED ORDER — ONDANSETRON HCL 4 MG PO TABS: 4.0000 mg | ORAL_TABLET | ORAL | 3 refills | Status: DC | PRN

## 2023-04-11 NOTE — Patient Instructions (Addendum)
_______________________________________________________  If your blood pressure at your visit was 140/90 or greater, please contact your primary care physician to follow up on this. _______________________________________________________  If you are age 88 or older, your body mass index should be between 23-30. Your Body mass index is 23.09 kg/m. If this is out of the aforementioned range listed, please consider follow up with your Primary Care Provider. ________________________________________________________  The [facility] providers would like to encourage you to use [facility] to communicate with providers for non-urgent requests or questions.  Due to long hold times on the telephone, sending your provider a message by [facility] may be a faster and more efficient way to get a response.  Please allow 48 business hours for a response.  Please remember that this is for non-urgent requests.  _______________________________________________________  We have sent the following medications to your pharmacy for you to pick up at your convenience:  Zofran 4mg   Your provider has requested that you go to the basement level for lab work before leaving today. Press "B" on the elevator. The lab is located at the first door on the left as you exit the elevator.  We will refer you to [facility]. Someone from their office should contact you.  Please let our office know if you have not heard anything from their office in 2 to 3 weeks.   You have been scheduled for a CT scan of the abdomen and pelvis at [facility], [place], [address], [place], [place] [address], 1st floor Radiology. You are scheduled on 04-16-23 at 4pm You should arrive 1:45pm to your appointment time for registration and to drink contrast at 2pm and 3pm.   You may take any medications as prescribed with a small amount of water, if necessary. If you take any of the following medications:  METFORMIN, GLUCOPHAGE, GLUCOVANCE, AVANDAMET, RIOMET, FORTAMET, ACTOPLUS MET, JANUMET, GLUMETZA or METAGLIP, you MAY be asked to HOLD this medication 48 hours AFTER the exam.   The purpose of you drinking the oral contrast is to aid in the visualization of your intestinal tract. The contrast solution may cause some diarrhea. Depending on your individual set of symptoms, you may also receive an intravenous injection of x-ray contrast/dye. Plan on being at [facility] for 45 minutes or longer, depending on the type of exam you are having performed.   If you have any questions regarding your exam or if you need to reschedule, you may call [facility] at [phone] between the hours of 8:00 am and 5:00 pm, Monday-Friday.   Due to recent changes in healthcare laws, you may see the results of your imaging and laboratory studies on MyChart before your provider has had a chance to review them.  We understand that in some cases there may be results that are confusing or concerning to you. Not all laboratory results come back in the same time frame and the provider may be waiting for multiple results in order to interpret others.  Please give [place] 48 hours in order for your provider to thoroughly review all the results before contacting the office for clarification of your results.   It was a pleasure to see you today!  Vito Cirigliano, D.O.

## 2023-04-11 NOTE — Progress Notes (Signed)
Chief Complaint:    Abdominal pain, nausea, decreased appetite  GI History: 89 y.o. male with a history of A-fib (on Coumadin, ASA), HTN, TIA (on ASA), diabetes, EtOH pancreatitis (no recent EtOH), GERD.  - 10/2021: Hospital admission for acute pancreatitis with peak lipase 3538. Was evaluated with ultrasound, CT, and responded to conservative therapy.  - 01/21/2023: ER evaluation for epigastric pain and nausea/vomiting.    - Lipase 569    - Normal BMP with BUN/creatinine 9/0.9    - AST/ALT 146/65, ALP 102, T. bili 1.2.  Previous normal liver enzymes on 11/25/2022 as below.    - WBC 19.2, PLT 427, normal H/H at 13/41    - CT A/P: Distended GB up to 4.3 cm in diameter and diffuse circumferential wall thickening with mild mucosal hyperattenuation.  Mildly atrophic pancreas with dilation of the main PD up to 5 mm in the body/tail, no significant interval change.  Subtle heterogeneity of the pancreatic head/uncinate process and minimal surrounding fat stranding, less pronounced compared with exam on 11/13/2021.  Unchanged 5 mm hypoattenuating focus in the left uncinate process.  Mild circumferential thickening in the duodenum c/w duodenitis    - RUQ [place]: Dilated GB with wall thickening and edema but no stones or duct dilation    - Was treated with Zofran, ceftriaxone, IV fluid with overall improvement and discharged home with Zofran, Protonix, Carafate, Roxicodone.  - 01/23/2023: Initial evaluation in the GI clinic.  Abdominal pain and nausea/vomiting had resolved.  Does report he had been started on metformin a few months prior.  Otherwise quit drinking 3+ years ago and no relapse.  Recommended MRI/MRCP in 4 weeks and repeat labs.  Patient declined EGD given resolution of symptoms. - 02/24/2023: MRCP: Dilation of the pancreatic duct measuring up to 0.7 cm with abrupt caliber change in the pancreatic head/neck junction with a diffusely atrophic pancreatic parenchyma distal to this area.  Radiologist favors  this to be chronic sequelae of pancreatitis without discrete visible mass.  Normal-appearing head of pancreas.  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       HPI:     Patient is a 89 y.o. male presenting to the Gastroenterology Clinic for evaluation of abdominal pain, nausea/vomiting, decreased appetite over the last 2 weeks.  Feels that he has decreased energy as well. Feels similar to prior sxs in October 2024.   No new medications, supplements, etc. prior to symptom recurrence.  Pain in the upper abdomen/epigastrium. Early satiety; full after 2-3 bites over last 2 weeks. Also with SOB in the AM, but improves through the day. Decreased energy over last couple weeks. He is down 17# from last appt in October, which he had initially attributed to having dental work right after Christmas with subsequent soft diet and reduced PO intake.   No fever.  No change in bowel habits, hematochezia, melena.  Review of systems:     No chest pain, no SOB, no fevers, no urinary sx   Past Medical History:  Diagnosis Date   A-fib (HCC)    Hypertension    TIA (transient ischemic attack)     Patient's surgical history, family medical history, social history, medications and allergies were all reviewed in Epic    Current Outpatient Medications  Medication Sig Dispense Refill   allopurinol (ZYLOPRIM) 100 MG tablet Take 100 mg by mouth 2 (two) times daily.  aspirin EC 81 MG tablet Take 81 mg by mouth daily.     atorvastatin (LIPITOR) 40 MG tablet Take 40 mg by mouth daily.     latanoprost (XALATAN) 0.005 % ophthalmic solution Place 1 drop into both eyes at bedtime.     Metoprolol Tartrate 75 MG TABS Take 75 mg by mouth in the morning and at bedtime.     Multiple Vitamins-Minerals (MULTIVITAMIN WITH MINERALS) tablet Take 1 tablet by mouth daily.     warfarin (COUMADIN) 5 MG tablet Take 1 tablet (5 mg  total) by mouth daily at 4 PM. 30 tablet 1   acetaminophen (TYLENOL) 650 MG CR tablet Take 650 mg by mouth every 8 (eight) hours as needed for pain. (Patient not taking: Reported on 04/11/2023)     albuterol (VENTOLIN HFA) 108 (90 Base) MCG/ACT inhaler Inhale into the lungs. (Patient not taking: Reported on 04/11/2023)     amLODipine (NORVASC) 5 MG tablet Take 2 tablets (10 mg total) by mouth daily. 30 tablet 1   benzonatate (TESSALON) 100 MG capsule Take 100 mg by mouth 3 (three) times daily as needed for cough. (Patient not taking: Reported on 04/11/2023)     cephALEXin (KEFLEX) 500 MG capsule Take 1 capsule (500 mg total) by mouth 4 (four) times daily. (Patient not taking: Reported on 04/11/2023) 20 capsule 0   FEROSUL 325 (65 Fe) MG tablet Take 325 mg by mouth 2 (two) times daily. (Patient not taking: Reported on 04/11/2023)     metFORMIN (GLUCOPHAGE) 500 MG tablet Take 1 tablet (500 mg total) by mouth 2 (two) times daily with a meal. 60 tablet 0   ondansetron (ZOFRAN) 4 MG tablet Take 1 tablet (4 mg total) by mouth every 4 (four) hours as needed for nausea or vomiting. (Patient not taking: Reported on 04/11/2023) 12 tablet 0   oxyCODONE (ROXICODONE) 5 MG immediate release tablet Take 1 tablet (5 mg total) by mouth every 4 (four) hours as needed for severe pain (pain score 7-10). (Patient not taking: Reported on 04/11/2023) 12 tablet 0   pantoprazole (PROTONIX) 40 MG tablet Take 40 mg by mouth daily. (Patient not taking: Reported on 04/11/2023)     pantoprazole (PROTONIX) 40 MG tablet Take 1 tablet (40 mg total) by mouth 2 (two) times daily for 14 days. 28 tablet 0   sucralfate (CARAFATE) 1 g tablet Take 1 tablet (1 g total) by mouth with breakfast, with lunch, and with evening meal for 7 days. 21 tablet 0   No current facility-administered medications for this visit.    Physical Exam:     BP 122/70   Pulse 97   Ht 6\' 1"  (1.854 m)   Wt 175 lb (79.4 kg)   BMI 23.09 kg/m   GENERAL:  Pleasant  male in NAD PSYCH: : Cooperative, normal affect EENT:  conjunctiva pink, mucous membranes moist, neck supple without masses CARDIAC:  RRR, no murmur heard, no peripheral edema PULM: Normal respiratory effort, lungs CTA bilaterally, no wheezing ABDOMEN: Mild discomfort in the upper abdomen without rebound, guarding, peritoneal signs.  Nondistended, soft, normal bowel sounds SKIN:  turgor, no lesions seen Musculoskeletal:  Normal muscle tone, normal strength NEURO: Alert and oriented x 3, no focal neurologic deficits   IMPRESSION and PLAN:    1) Epigastric pain 2) Nausea/vomiting 3) Decreased appetite 4) History of pancreatitis 5) Early satiety High index of suspicion that the etiology of her symptoms is pancreatic, whether that be acute pancreatitis superimposed on chronic pancreatitis, evolution  of chronic pancreatitis, or alternative pancreatic pathology, with plan for the following:  - Check CBC, CMP, lipase today - CT abdomen/pelvis.  Will call radiology to expedite - Referral to [facility] service for EUS - Previously reviewed medication list for potential culprits of pancreatitis, which includes acetaminophen (class II) atorvastatin, (class III), metformin (class IV) - He will contact his PCM today to consider changing from metformin, hold atorvastatin - Zofran ODT 4 mg prn Q6 hours  - Discussed ER return precautions - Depending on CT findings, may also need CXR      I spent 40 minutes of time, including in depth chart review, independent review of results as outlined above, communicating results with the patient directly, face-to-face time with the patient, coordinating care, ordering studies and medications as appropriate, and documentation.   Shellia Cleverly ,DO, FACG 04/11/2023, 10:25 AM

## 2023-04-15 ENCOUNTER — Telehealth: Payer: Self-pay

## 2023-04-15 NOTE — Telephone Encounter (Signed)
Faxed referral with16 pages to [facility] clinic [phone] for upper EUS.  Faxed confirmation was received.

## 2023-04-16 ENCOUNTER — Ambulatory Visit (HOSPITAL_BASED_OUTPATIENT_CLINIC_OR_DEPARTMENT_OTHER)
Admission: RE | Admit: 2023-04-16 | Discharge: 2023-04-16 | Disposition: A | Discharge status: Home / Self Care | Payer: Medicare HMO | Source: Ambulatory Visit | Attending: Gastroenterology | Admitting: Gastroenterology

## 2023-04-16 DIAGNOSIS — R112 Nausea with vomiting, unspecified: Secondary | ICD-10-CM | POA: Diagnosis present

## 2023-04-16 DIAGNOSIS — R1013 Epigastric pain: Secondary | ICD-10-CM | POA: Insufficient documentation

## 2023-04-16 DIAGNOSIS — K859 Acute pancreatitis without necrosis or infection, unspecified: Secondary | ICD-10-CM | POA: Diagnosis present

## 2023-04-16 MED ORDER — IOHEXOL 300 MG/ML  SOLN
100.0000 mL | Freq: Once | INTRAMUSCULAR | Status: AC | PRN
  Administered 2023-04-16: 100 mL via INTRAVENOUS

## 2023-04-18 ENCOUNTER — Other Ambulatory Visit: Payer: Self-pay

## 2023-04-18 ENCOUNTER — Emergency Department (HOSPITAL_BASED_OUTPATIENT_CLINIC_OR_DEPARTMENT_OTHER): Payer: Medicare HMO | Admitting: Radiology

## 2023-04-18 ENCOUNTER — Emergency Department (HOSPITAL_BASED_OUTPATIENT_CLINIC_OR_DEPARTMENT_OTHER)
Admission: EM | Admit: 2023-04-18 | Discharge: 2023-04-18 | Disposition: A | Discharge status: Home / Self Care | Payer: Medicare HMO | Attending: Emergency Medicine | Admitting: Emergency Medicine

## 2023-04-18 ENCOUNTER — Telehealth: Payer: Self-pay | Admitting: Gastroenterology

## 2023-04-18 ENCOUNTER — Encounter (HOSPITAL_BASED_OUTPATIENT_CLINIC_OR_DEPARTMENT_OTHER): Payer: Self-pay | Admitting: Emergency Medicine

## 2023-04-18 DIAGNOSIS — Z7984 Long term (current) use of oral hypoglycemic drugs: Secondary | ICD-10-CM | POA: Insufficient documentation

## 2023-04-18 DIAGNOSIS — I10 Essential (primary) hypertension: Secondary | ICD-10-CM | POA: Diagnosis not present

## 2023-04-18 DIAGNOSIS — Z7901 Long term (current) use of anticoagulants: Secondary | ICD-10-CM | POA: Diagnosis not present

## 2023-04-18 DIAGNOSIS — Z7982 Long term (current) use of aspirin: Secondary | ICD-10-CM | POA: Diagnosis not present

## 2023-04-18 DIAGNOSIS — Z8673 Personal history of transient ischemic attack (TIA), and cerebral infarction without residual deficits: Secondary | ICD-10-CM | POA: Insufficient documentation

## 2023-04-18 DIAGNOSIS — E119 Type 2 diabetes mellitus without complications: Secondary | ICD-10-CM | POA: Insufficient documentation

## 2023-04-18 DIAGNOSIS — Z79899 Other long term (current) drug therapy: Secondary | ICD-10-CM | POA: Insufficient documentation

## 2023-04-18 DIAGNOSIS — K861 Other chronic pancreatitis: Secondary | ICD-10-CM | POA: Insufficient documentation

## 2023-04-18 DIAGNOSIS — R109 Unspecified abdominal pain: Secondary | ICD-10-CM | POA: Diagnosis present

## 2023-04-18 LAB — CBC WITH DIFFERENTIAL/PLATELET
Abs Immature Granulocytes: 0.14 10*3/uL — ABNORMAL HIGH (ref 0.00–0.07)
Basophils Absolute: 0 10*3/uL (ref 0.0–0.1)
Basophils Relative: 0 %
Eosinophils Absolute: 0 10*3/uL (ref 0.0–0.5)
Eosinophils Relative: 0 %
HCT: 31.6 % — ABNORMAL LOW (ref 39.0–52.0)
Hemoglobin: 10.1 g/dL — ABNORMAL LOW (ref 13.0–17.0)
Immature Granulocytes: 1 %
Lymphocytes Relative: 15 %
Lymphs Abs: 2.3 10*3/uL (ref 0.7–4.0)
MCH: 27.4 pg (ref 26.0–34.0)
MCHC: 32 g/dL (ref 30.0–36.0)
MCV: 85.6 fL (ref 80.0–100.0)
Monocytes Absolute: 0.9 10*3/uL (ref 0.1–1.0)
Monocytes Relative: 6 %
Neutro Abs: 11.7 10*3/uL — ABNORMAL HIGH (ref 1.7–7.7)
Neutrophils Relative %: 78 %
Platelets: 465 10*3/uL — ABNORMAL HIGH (ref 150–400)
RBC: 3.69 MIL/uL — ABNORMAL LOW (ref 4.22–5.81)
RDW: 14.4 % (ref 11.5–15.5)
WBC: 15.2 10*3/uL — ABNORMAL HIGH (ref 4.0–10.5)
nRBC: 0 % (ref 0.0–0.2)

## 2023-04-18 LAB — LIPASE, BLOOD: Lipase: 81 U/L — ABNORMAL HIGH (ref 11–51)

## 2023-04-18 LAB — COMPREHENSIVE METABOLIC PANEL
ALT: 14 U/L (ref 0–44)
AST: 35 U/L (ref 15–41)
Albumin: 3.3 g/dL — ABNORMAL LOW (ref 3.5–5.0)
Alkaline Phosphatase: 99 U/L (ref 38–126)
Anion gap: 7 (ref 5–15)
BUN: 13 mg/dL (ref 8–23)
CO2: 29 mmol/L (ref 22–32)
Calcium: 8.9 mg/dL (ref 8.9–10.3)
Chloride: 103 mmol/L (ref 98–111)
Creatinine, Ser: 0.94 mg/dL (ref 0.61–1.24)
GFR, Estimated: >60 mL/min (ref >60)
Glucose, Bld: 96 mg/dL (ref 70–99)
Potassium: 4.5 mmol/L (ref 3.5–5.1)
Sodium: 139 mmol/L (ref 135–145)
Total Bilirubin: 0.4 mg/dL (ref 0.0–1.2)
Total Protein: 6.1 g/dL — ABNORMAL LOW (ref 6.5–8.1)

## 2023-04-18 LAB — MAGNESIUM: Magnesium: 1.7 mg/dL (ref 1.7–2.4)

## 2023-04-18 MED ORDER — SODIUM CHLORIDE 0.9 % IV BOLUS
500.0000 mL | Freq: Once | INTRAVENOUS | Status: AC
  Administered 2023-04-18: 500 mL via INTRAVENOUS

## 2023-04-18 NOTE — ED Triage Notes (Signed)
CT on 04/16/2023 acute pancreatitis  Advised to go to ED  by GI Not eating much  Some Sob No pain right now

## 2023-04-18 NOTE — Telephone Encounter (Signed)
Can you please call daughter back

## 2023-04-18 NOTE — Telephone Encounter (Signed)
Inbound call from patient's daughter, Selena Batten, requesting a call to discuss recent CT results. Also states patient has been scheduled for endoscopy in 8 weeks. Daughter is concerned how far out appointment is scheduled for. Requesting a call to discuss sooner. Please advise, thank you.

## 2023-04-18 NOTE — Discharge Instructions (Signed)
You were seen today for your abnormal CT scan.  This is likely due to chronic pancreatitis.  We did not see any evidence of active pancreatitis on your lab work today.  I recommend you follow-up closely with your primary care doctor and your GI doctor.  If you develop abdominal pain, vomiting, shortness of breath, chest pain or any other new concerning symptoms you should return to the ED.

## 2023-04-18 NOTE — ED Provider Notes (Signed)
[place] EMERGENCY DEPARTMENT AT [facility] Provider Note   CSN: [id number] Arrival date & time: 04/18/23  1457     History  Chief Complaint  Patient presents with   Abdominal Pain   Pancreatitis    Christian Crawford is a 89 y.o. male.   Abdominal Pain 89 year old male history of atrial fibrillation on Coumadin, hypertension, prior TIA, diabetes, pancreatitis, GERD presenting for abnormal CT scan.  Patient was recently evaluated by GI for pancreatitis January 17.  They ordered a CT scan which showed pancreatitis with some possible necrosis.  The results can back today and they called him to go to the ED for evaluation.  Patient reports she has had some recent unintentional weight loss over the last few weeks to months.  He has pain with significant p.o. intake and poor p.o. tolerance.  No nausea or vomiting.  He does not have any pain currently.  He said regular bowel movements.  No melena or hematochezia.  He is also shortness of breath today, no exertional short of breath or orthopnea and is similar to prior shortness of breath he has had.  No lower extremity edema.  No cough.  No fevers or chills.  Currently asymptomatic.     Home Medications Prior to Admission medications   Medication Sig Start Date End Date Taking? Authorizing Provider  acetaminophen (TYLENOL) 650 MG CR tablet Take 650 mg by mouth every 8 (eight) hours as needed for pain. Patient not taking: Reported on 04/11/2023    [provider]  albuterol (VENTOLIN HFA) 108 (90 Base) MCG/ACT inhaler Inhale into the lungs. Patient not taking: Reported on 04/11/2023 11/12/21   [provider]  allopurinol (ZYLOPRIM) 100 MG tablet Take 100 mg by mouth 2 (two) times daily.    [provider]  amLODipine (NORVASC) 5 MG tablet Take 2 tablets (10 mg total) by mouth daily. 10/12/22 11/11/22  Rexford Maus, DO  aspirin EC 81 MG tablet Take 81 mg by mouth daily.    [provider]   atorvastatin (LIPITOR) 40 MG tablet Take 40 mg by mouth daily.    [provider]  benzonatate (TESSALON) 100 MG capsule Take 100 mg by mouth 3 (three) times daily as needed for cough. Patient not taking: Reported on 04/11/2023    [provider]  cephALEXin (KEFLEX) 500 MG capsule Take 1 capsule (500 mg total) by mouth 4 (four) times daily. Patient not taking: Reported on 04/11/2023 10/12/22   Elayne Snare K, DO  FEROSUL 325 (65 Fe) MG tablet Take 325 mg by mouth 2 (two) times daily. Patient not taking: Reported on 04/11/2023 11/01/21   [provider]  latanoprost (XALATAN) 0.005 % ophthalmic solution Place 1 drop into both eyes at bedtime. 06/10/19   [provider]  metFORMIN (GLUCOPHAGE) 500 MG tablet Take 1 tablet (500 mg total) by mouth 2 (two) times daily with a meal. 10/12/22 11/11/22  Elayne Snare K, DO  Metoprolol Tartrate 75 MG TABS Take 75 mg by mouth in the morning and at bedtime.    [provider]  Multiple Vitamins-Minerals (MULTIVITAMIN WITH MINERALS) tablet Take 1 tablet by mouth daily.    [provider]  ondansetron (ZOFRAN) 4 MG tablet Take 1 tablet (4 mg total) by mouth every 4 (four) hours as needed for nausea or vomiting. 04/11/23   Cirigliano, Vito V, DO  oxyCODONE (ROXICODONE) 5 MG immediate release tablet Take 1 tablet (5 mg total) by mouth every 4 (four) hours as  needed for severe pain (pain score 7-10). Patient not taking: Reported on 04/11/2023 01/21/23   Tanda Rockers A, DO  pantoprazole (PROTONIX) 40 MG tablet Take 40 mg by mouth daily. Patient not taking: Reported on 04/11/2023    [provider]  pantoprazole (PROTONIX) 40 MG tablet Take 1 tablet (40 mg total) by mouth 2 (two) times daily for 14 days. 01/21/23 02/04/23  Sloan Leiter, DO  sucralfate (CARAFATE) 1 g tablet Take 1 tablet (1 g total) by mouth with breakfast, with lunch, and with evening meal for 7 days. 01/21/23 01/28/23  Tanda Rockers A,  DO  warfarin (COUMADIN) 5 MG tablet Take 1 tablet (5 mg total) by mouth daily at 4 PM. 07/26/19   Shahmehdi, Gemma Payor, MD      Allergies    Patient has no known allergies.    Review of Systems   Review of Systems  Gastrointestinal:  Positive for abdominal pain.  Review of systems completed and notable as per HPI.  ROS otherwise negative.   Physical Exam Updated Vital Signs BP (!) 168/69   Pulse 79   Temp 98 F (36.7 C)   Resp (!) 36   SpO2 97%  Physical Exam Vitals and nursing note reviewed.  Constitutional:      General: He is not in acute distress.    Appearance: He is well-developed.  HENT:     Head: Normocephalic and atraumatic.     Nose: Nose normal.     Mouth/Throat:     Mouth: Mucous membranes are moist.     Pharynx: Oropharynx is clear.  Eyes:     Extraocular Movements: Extraocular movements intact.     Conjunctiva/sclera: Conjunctivae normal.     Pupils: Pupils are equal, round, and reactive to light.  Cardiovascular:     Rate and Rhythm: Normal rate and regular rhythm.     Pulses: Normal pulses.     Heart sounds: Normal heart sounds. No murmur heard. Pulmonary:     Effort: Pulmonary effort is normal. No respiratory distress.     Breath sounds: Normal breath sounds.  Abdominal:     Palpations: Abdomen is soft.     Tenderness: There is no abdominal tenderness. There is no right CVA tenderness, left CVA tenderness, guarding or rebound.  Musculoskeletal:        General: No swelling.     Cervical back: Neck supple.     Right lower leg: No edema.     Left lower leg: No edema.  Skin:    General: Skin is warm and dry.     Capillary Refill: Capillary refill takes less than 2 seconds.  Neurological:     General: No focal deficit present.     Mental Status: He is alert and oriented to person, place, and time. Mental status is at baseline.  Psychiatric:        Mood and Affect: Mood normal.     ED Results / Procedures / Treatments   Labs (all labs ordered are  listed, but only abnormal results are displayed) Labs Reviewed  COMPREHENSIVE METABOLIC PANEL - Abnormal; Notable for the following components:      Result Value   Total Protein 6.1 (*)    Albumin 3.3 (*)    All other components within normal limits  LIPASE, BLOOD - Abnormal; Notable for the following components:   Lipase 81 (*)    All other components within normal limits  CBC WITH DIFFERENTIAL/PLATELET - Abnormal; Notable for the following  components:   WBC 15.2 (*)    RBC 3.69 (*)    Hemoglobin 10.1 (*)    HCT 31.6 (*)    Platelets 465 (*)    Neutro Abs 11.7 (*)    Abs Immature Granulocytes 0.14 (*)    All other components within normal limits  MAGNESIUM  URINALYSIS, ROUTINE W REFLEX MICROSCOPIC    EKG EKG Interpretation Date/Time:  Friday April 18 2023 15:14:55 EST Ventricular Rate:  81 PR Interval:  191 QRS Duration:  71 QT Interval:  389 QTC Calculation: 452 R Axis:   43  Text Interpretation: Sinus rhythm Borderline T abnormalities, anterior leads Confirmed by Fulton Reek [id number]) on 04/18/2023 3:35:13 PM  Radiology DG Chest 2 View Result Date: 04/18/2023 CLINICAL DATA:  Shortness of breath. EXAM: CHEST - 2 VIEW COMPARISON:  11/21/2021. FINDINGS: Bilateral lung fields are clear. Bilateral costophrenic angles are clear. Normal cardio-mediastinal silhouette. No acute osseous abnormalities. The soft tissues are within normal limits. IMPRESSION: No active cardiopulmonary disease. Electronically Signed   By: Jules Schick M.D.   On: 04/18/2023 16:20    Procedures Procedures    Medications Ordered in ED Medications  sodium chloride 0.9 % bolus 500 mL (500 mLs Intravenous New Bag/Given 04/18/23 1704)    ED Course/ Medical Decision Making/ A&P Clinical Course as of 04/18/23 1903  Fri Apr 18, 2023  1833 Dr. Barron Alvine GI: PO challenge, can go if better [JD]    Clinical Course User Index [JD] Laurence Spates, MD                                 Medical  Decision Making Amount and/or Complexity of Data Reviewed Labs: ordered. Radiology: ordered.   Medical Decision Making:   Ilia Engelbert is a 89 y.o. male who presented to the ED today with normal CT scan.  CT scan June 22 showed small focus of pseudocyst versus necrosis of the pancreas with acute pancreatitis.  Currently he is feeling okay.  No abdominal pain and benign abdominal exam.  Will repeat some labs here.  Earlier had some mild shortness of breath today.  No chest pain, EKG without acute ischemia.  Obtain chest x-ray for evaluation.  Low suspicion for ACS, PE.  Does not look volume overloaded.  Lab work with nearly normal lipase.  CMP is unremarkable.  He has leukocytosis but this is improving from prior.   Patient placed on continuous vitals and telemetry monitoring while in ED which was reviewed periodically.  Reviewed and confirmed nursing documentation for past medical history, family history, social history.  Reassessment and Plan:   On reassessment he remains asymptomatic.  He is tolerating p.o.  I spoke with Dr. Barron Alvine, he states that most of the misunderstanding.  From his perspective patient does not need to be in the ED, and he does not seem indication for admission if he is tolerating p.o. not having significant pain.  He is working to set up endoscopic ultrasound through [facility] for further evaluation of his chronic pancreatitis.  On reevaluation he is still doing okay.  He is tolerating p.o., no pain.  He is not short of breath.  Vital signs are stable.  I do not see any reason that he needs admission at this time.  Did recommend he follow-up with his PCP given the episode of shortness of breath earlier, as well as with GI by calling the office on Monday to  try to get his evaluation through [facility] expedited.  He is comfortable this plan.  Strict return precautions given.   Patient's presentation is most consistent with acute complicated illness / injury  requiring diagnostic workup.           Final Clinical Impression(s) / ED Diagnoses Final diagnoses:  Chronic pancreatitis, unspecified pancreatitis type [facility])    Rx / DC Orders ED Discharge Orders     None         Laurence Spates, MD 04/18/23 [phone]

## 2023-04-18 NOTE — Telephone Encounter (Signed)
See 04/16/23 CT result note for additional correspondence.

## 2023-04-21 ENCOUNTER — Encounter (HOSPITAL_COMMUNITY): Payer: Self-pay

## 2023-04-21 ENCOUNTER — Emergency Department (HOSPITAL_COMMUNITY): Payer: Medicare HMO

## 2023-04-21 ENCOUNTER — Other Ambulatory Visit: Payer: Self-pay

## 2023-04-21 ENCOUNTER — Observation Stay (HOSPITAL_BASED_OUTPATIENT_CLINIC_OR_DEPARTMENT_OTHER)
Admission: EM | Admit: 2023-04-21 | Discharge: 2023-04-22 | Disposition: A | Discharge status: Home / Self Care | Payer: Medicare HMO | Source: Home / Self Care | Attending: Emergency Medicine | Admitting: Emergency Medicine

## 2023-04-21 DIAGNOSIS — R55 Syncope and collapse: Secondary | ICD-10-CM | POA: Diagnosis not present

## 2023-04-21 DIAGNOSIS — Z8673 Personal history of transient ischemic attack (TIA), and cerebral infarction without residual deficits: Secondary | ICD-10-CM | POA: Insufficient documentation

## 2023-04-21 DIAGNOSIS — I10 Essential (primary) hypertension: Secondary | ICD-10-CM | POA: Insufficient documentation

## 2023-04-21 DIAGNOSIS — K861 Other chronic pancreatitis: Secondary | ICD-10-CM | POA: Insufficient documentation

## 2023-04-21 DIAGNOSIS — R791 Abnormal coagulation profile: Secondary | ICD-10-CM | POA: Insufficient documentation

## 2023-04-21 DIAGNOSIS — E119 Type 2 diabetes mellitus without complications: Secondary | ICD-10-CM | POA: Insufficient documentation

## 2023-04-21 DIAGNOSIS — Z87891 Personal history of nicotine dependence: Secondary | ICD-10-CM | POA: Insufficient documentation

## 2023-04-21 DIAGNOSIS — F1090 Alcohol use, unspecified, uncomplicated: Secondary | ICD-10-CM | POA: Insufficient documentation

## 2023-04-21 DIAGNOSIS — I482 Chronic atrial fibrillation, unspecified: Secondary | ICD-10-CM | POA: Insufficient documentation

## 2023-04-21 DIAGNOSIS — Z7984 Long term (current) use of oral hypoglycemic drugs: Secondary | ICD-10-CM | POA: Insufficient documentation

## 2023-04-21 DIAGNOSIS — D6832 Hemorrhagic disorder due to extrinsic circulating anticoagulants: Secondary | ICD-10-CM | POA: Diagnosis not present

## 2023-04-21 DIAGNOSIS — Z7901 Long term (current) use of anticoagulants: Secondary | ICD-10-CM | POA: Insufficient documentation

## 2023-04-21 DIAGNOSIS — T148XXA Other injury of unspecified body region, initial encounter: Principal | ICD-10-CM

## 2023-04-21 DIAGNOSIS — Z79899 Other long term (current) drug therapy: Secondary | ICD-10-CM | POA: Insufficient documentation

## 2023-04-21 DIAGNOSIS — D649 Anemia, unspecified: Secondary | ICD-10-CM

## 2023-04-21 LAB — CBC WITH DIFFERENTIAL/PLATELET
Abs Immature Granulocytes: 0.14 10*3/uL — ABNORMAL HIGH (ref 0.00–0.07)
Basophils Absolute: 0.1 10*3/uL (ref 0.0–0.1)
Basophils Relative: 0 %
Eosinophils Absolute: 0.3 10*3/uL (ref 0.0–0.5)
Eosinophils Relative: 2 %
HCT: 28.6 % — ABNORMAL LOW (ref 39.0–52.0)
Hemoglobin: 9 g/dL — ABNORMAL LOW (ref 13.0–17.0)
Immature Granulocytes: 1 %
Lymphocytes Relative: 24 %
Lymphs Abs: 3.8 10*3/uL (ref 0.7–4.0)
MCH: 27.5 pg (ref 26.0–34.0)
MCHC: 31.5 g/dL (ref 30.0–36.0)
MCV: 87.5 fL (ref 80.0–100.0)
Monocytes Absolute: 1 10*3/uL (ref 0.1–1.0)
Monocytes Relative: 6 %
Neutro Abs: 10.7 10*3/uL — ABNORMAL HIGH (ref 1.7–7.7)
Neutrophils Relative %: 67 %
Platelets: 770 10*3/uL — ABNORMAL HIGH (ref 150–400)
RBC: 3.27 MIL/uL — ABNORMAL LOW (ref 4.22–5.81)
RDW: 14.6 % (ref 11.5–15.5)
WBC: 16 10*3/uL — ABNORMAL HIGH (ref 4.0–10.5)
nRBC: 0 % (ref 0.0–0.2)

## 2023-04-21 LAB — PROTIME-INR
INR: 7.8 (ref 0.8–1.2)
INR: 2.6 — ABNORMAL HIGH (ref 0.8–1.2)
Prothrombin Time: 65.8 s — ABNORMAL HIGH (ref 11.4–15.2)
Prothrombin Time: 27.9 s — ABNORMAL HIGH (ref 11.4–15.2)

## 2023-04-21 LAB — COMPREHENSIVE METABOLIC PANEL
ALT: 18 U/L (ref 0–44)
AST: 34 U/L (ref 15–41)
Albumin: 2.9 g/dL — ABNORMAL LOW (ref 3.5–5.0)
Alkaline Phosphatase: 89 U/L (ref 38–126)
Anion gap: 12 (ref 5–15)
BUN: 13 mg/dL (ref 8–23)
CO2: 22 mmol/L (ref 22–32)
Calcium: 8.8 mg/dL — ABNORMAL LOW (ref 8.9–10.3)
Chloride: 107 mmol/L (ref 98–111)
Creatinine, Ser: 0.69 mg/dL (ref 0.61–1.24)
GFR, Estimated: >60 mL/min (ref >60)
Glucose, Bld: 163 mg/dL — ABNORMAL HIGH (ref 70–99)
Potassium: 3.7 mmol/L (ref 3.5–5.1)
Sodium: 141 mmol/L (ref 135–145)
Total Bilirubin: 0.9 mg/dL (ref 0.0–1.2)
Total Protein: 6.5 g/dL (ref 6.5–8.1)

## 2023-04-21 LAB — CBG MONITORING, ED
Glucose-Capillary: 139 mg/dL — ABNORMAL HIGH (ref 70–99)
Glucose-Capillary: 139 mg/dL — ABNORMAL HIGH (ref 70–99)

## 2023-04-21 LAB — HEMOGLOBIN A1C
Hgb A1c MFr Bld: 6.2 % — ABNORMAL HIGH (ref 4.8–5.6)
Mean Plasma Glucose: 131.24 mg/dL

## 2023-04-21 LAB — TROPONIN I (HIGH SENSITIVITY): Troponin I (High Sensitivity): 7 ng/L (ref <18)

## 2023-04-21 LAB — AMMONIA: Ammonia: 13 umol/L (ref 9–35)

## 2023-04-21 LAB — LIPASE, BLOOD: Lipase: 77 U/L — ABNORMAL HIGH (ref 11–51)

## 2023-04-21 MED ORDER — SUCRALFATE 1 G PO TABS
1.0000 g | ORAL_TABLET | Freq: Three times a day (TID) | ORAL | Status: DC
  Administered 2023-04-21 – 2023-04-22 (×2): 1 g via ORAL
  Filled 2023-04-21 (×2): qty 1

## 2023-04-21 MED ORDER — LOSARTAN POTASSIUM 25 MG PO TABS
25.0000 mg | ORAL_TABLET | Freq: Every day | ORAL | Status: DC
  Administered 2023-04-21 – 2023-04-22 (×2): 25 mg via ORAL
  Filled 2023-04-21 (×2): qty 1

## 2023-04-21 MED ORDER — IOHEXOL 300 MG/ML  SOLN
100.0000 mL | Freq: Once | INTRAMUSCULAR | Status: AC | PRN
  Administered 2023-04-21: 100 mL via INTRAVENOUS

## 2023-04-21 MED ORDER — AMLODIPINE BESYLATE 5 MG PO TABS
10.0000 mg | ORAL_TABLET | Freq: Every day | ORAL | Status: DC
  Administered 2023-04-21 – 2023-04-22 (×2): 10 mg via ORAL
  Filled 2023-04-21 (×2): qty 2

## 2023-04-21 MED ORDER — INSULIN ASPART 100 UNIT/ML IJ SOLN
0.0000 [IU] | Freq: Every day | INTRAMUSCULAR | Status: DC
  Filled 2023-04-21: qty 0.05

## 2023-04-21 MED ORDER — VITAMIN K1 10 MG/ML IJ SOLN
5.0000 mg | Freq: Once | INTRAVENOUS | Status: AC
  Administered 2023-04-21: 5 mg via INTRAVENOUS
  Filled 2023-04-21: qty 0.5

## 2023-04-21 MED ORDER — METOPROLOL TARTRATE 25 MG PO TABS
50.0000 mg | ORAL_TABLET | Freq: Two times a day (BID) | ORAL | Status: DC
  Administered 2023-04-21 – 2023-04-22 (×2): 50 mg via ORAL
  Filled 2023-04-21 (×2): qty 2

## 2023-04-21 MED ORDER — INSULIN ASPART 100 UNIT/ML IJ SOLN
0.0000 [IU] | Freq: Three times a day (TID) | INTRAMUSCULAR | Status: DC
  Administered 2023-04-21 – 2023-04-22 (×2): 1 [IU] via SUBCUTANEOUS
  Filled 2023-04-21: qty 0.09

## 2023-04-21 MED ORDER — ROSUVASTATIN CALCIUM 20 MG PO TABS
20.0000 mg | ORAL_TABLET | Freq: Every day | ORAL | Status: DC
  Administered 2023-04-21: 20 mg via ORAL
  Filled 2023-04-21: qty 1

## 2023-04-21 NOTE — ED Notes (Signed)
139 IS HIS BLOOD SUGAR

## 2023-04-21 NOTE — H&P (Signed)
History and Physical    Christian Crawford ZOX:[id number] DOB: 1933-09-18 DOA: 04/21/2023  PCP: Ellyn Hack, MD   Chief Complaint: Spontaneous bruising  HPI: Christian Crawford is a 89 y.o. male with medical history significant of atrial fibrillation on Coumadin, hypertension, prior TIA, diabetes, pancreatitis, GERD who presents to the hospital with new onset left chest wall hematoma and pain.  Denies any noted trauma, INR in the ED elevated to 7.8.  No issues previously with INR, no recent Coumadin changes, no new medications, no dietary changes.  He otherwise feels quite well denies nausea vomiting diarrhea constipation any fevers chills shortness of breath, notable left chest wall tenderness to palpation and increased pain with respiratory effort.  ED Course: Patient received vitamin K x 1  Review of Systems: As per HPI otherwise unremarkable.   Assessment/Plan Principal Problem:   Elevated INR    Acute spontaneous hematoma in the setting of elevated INR  -INR 7.8, no history of labile INR in the past per patient or family at bedside.  Chart review confirms moderately stable INR ranging between 1.9 and 3.0 over the past 3 years per our records with no elevated or low episodes. -Continue to follow INR every 6 hours, repeat vitamin K, consider FFP if signs or symptoms of bleeding.  None currently. -Notably allopurinol known to have INR prolonging effects, this is not a new medication however and no recent dose changes per family report. -Continue to hold warfarin, aspirin  Leukocytosis, thrombocytosis, chronic anemia of chronic disease  -Elevated white count, platelet count and mild anemia noted over the last 3 months -Possible hemoconcentration given poor p.o. intake per family, IV fluids discontinued, increase p.o. intake as appropriate  Chronic pancreatitis without acute exacerbation -Lipase elevated to 77 but lower than previous labs earlier this month, appears to be chronically  elevated -CT abdomen pelvis read as acute pancreatitis, however patient has no symptoms denies nausea vomiting abdominal pain -tolerating p.o. quite well during interview  A-fib, chronic, rate controlled -Currently rate controlled, continue home metoprolol at lower dose 50 twice daily  History of TIA Hypertension, essential Not insulin-dependent diabetes type 2, well-controlled -Hypertension moderately well-controlled, resume home medications including amlodipine, losartan, metoprolol -Continue statin, hold aspirin given above -Continue diabetic diet, sliding scale insulin per protocol  DVT prophylaxis: Holding in the setting of elevated INR Code Status: Full Family Communication: Family at bedside Status is: Observation  Dispo: The patient is from: Home              Anticipated d/c is to: Home              Anticipated d/c date is: 24 to 48 hours              Patient currently not medically stable for discharge given need for ongoing close monitoring in setting of elevated INR and potentially spontaneous bleed  Consultants:  None  Procedures:  None   Past Medical History:  Diagnosis Date   A-fib (HCC)    Hypertension    TIA (transient ischemic attack)     Past Surgical History:  Procedure Laterality Date   EYE SURGERY     had "droopy eye"     reports that he has quit smoking. His smoking use included cigarettes. He has a 100 pack-year smoking history. He has never used smokeless tobacco. He reports current alcohol use. He reports that he does not use drugs.  No Known Allergies  Family History  Problem Relation Age of  Onset   Colon cancer Brother    Esophageal cancer Neg Hx    Liver disease Neg Hx     Prior to Admission medications   Medication Sig Start Date End Date Taking? Authorizing Provider  acetaminophen (TYLENOL) 650 MG CR tablet Take 650 mg by mouth every 8 (eight) hours as needed for pain. Patient not taking: Reported on 04/11/2023    [provider]  albuterol (VENTOLIN HFA) 108 (90 Base) MCG/ACT inhaler Inhale into the lungs. Patient not taking: Reported on 04/11/2023 11/12/21   [provider]  allopurinol (ZYLOPRIM) 100 MG tablet Take 100 mg by mouth 2 (two) times daily.    [provider]  amLODipine (NORVASC) 5 MG tablet Take 2 tablets (10 mg total) by mouth daily. 10/12/22 11/11/22  Rexford Maus, DO  aspirin EC 81 MG tablet Take 81 mg by mouth daily.    [provider]  atorvastatin (LIPITOR) 40 MG tablet Take 40 mg by mouth daily.    [provider]  benzonatate (TESSALON) 100 MG capsule Take 100 mg by mouth 3 (three) times daily as needed for cough. Patient not taking: Reported on 04/11/2023    [provider]  cephALEXin (KEFLEX) 500 MG capsule Take 1 capsule (500 mg total) by mouth 4 (four) times daily. Patient not taking: Reported on 04/11/2023 10/12/22   Elayne Snare K, DO  FEROSUL 325 (65 Fe) MG tablet Take 325 mg by mouth 2 (two) times daily. Patient not taking: Reported on 04/11/2023 11/01/21   [provider]  latanoprost (XALATAN) 0.005 % ophthalmic solution Place 1 drop into both eyes at bedtime. 06/10/19   [provider]  metFORMIN (GLUCOPHAGE) 500 MG tablet Take 1 tablet (500 mg total) by mouth 2 (two) times daily with a meal. 10/12/22 11/11/22  Elayne Snare K, DO  Metoprolol Tartrate 75 MG TABS Take 75 mg by mouth in the morning and at bedtime.    [provider]  Multiple Vitamins-Minerals (MULTIVITAMIN WITH MINERALS) tablet Take 1 tablet by mouth daily.    [provider]  ondansetron (ZOFRAN) 4 MG tablet Take 1 tablet (4 mg total) by mouth every 4 (four) hours as needed for nausea or vomiting. 04/11/23   Cirigliano, Vito V, DO  oxyCODONE (ROXICODONE) 5 MG immediate release tablet Take 1 tablet (5 mg total) by mouth every 4 (four) hours as needed for severe pain (pain score 7-10). Patient not taking: Reported on  04/11/2023 01/21/23   Tanda Rockers A, DO  pantoprazole (PROTONIX) 40 MG tablet Take 40 mg by mouth daily. Patient not taking: Reported on 04/11/2023    [provider]  pantoprazole (PROTONIX) 40 MG tablet Take 1 tablet (40 mg total) by mouth 2 (two) times daily for 14 days. 01/21/23 02/04/23  Sloan Leiter, DO  sucralfate (CARAFATE) 1 g tablet Take 1 tablet (1 g total) by mouth with breakfast, with lunch, and with evening meal for 7 days. 01/21/23 01/28/23  Tanda Rockers A, DO  warfarin (COUMADIN) 5 MG tablet Take 1 tablet (5 mg total) by mouth daily at 4 PM. 07/26/19   Kendell Bane, MD    Physical Exam: Vitals:   04/21/23 1040 04/21/23 1043 04/21/23 1130 04/21/23 1300  BP: 129/88 106/62 134/82 (!) 159/87  Pulse: (!) 126 (!) 125 (!) 108 89  Resp: (!) 23 14 (!) 22 18  Temp: 97.8 F (36.6 C)     TempSrc: Oral     SpO2: 100% 100% 100% 97%  Weight:  79.4 kg    Height:  6\' 1"  (1.854 m)      Constitutional: NAD, calm, comfortable Vitals:   04/21/23 1040 04/21/23 1043 04/21/23 1130 04/21/23 1300  BP: 129/88 106/62 134/82 (!) 159/87  Pulse: (!) 126 (!) 125 (!) 108 89  Resp: (!) 23 14 (!) 22 18  Temp: 97.8 F (36.6 C)     TempSrc: Oral     SpO2: 100% 100% 100% 97%  Weight:  79.4 kg    Height:  6\' 1"  (1.854 m)     General:  Pleasantly resting in bed, No acute distress. HEENT:  Normocephalic atraumatic.  Sclerae nonicteric, noninjected.  Extraocular movements intact bilaterally. Neck:  Without mass or deformity.  Trachea is midline. Lungs:  Clear to auscultate bilaterally without rhonchi, wheeze, or rales. Heart:  Regular rate and rhythm.  Without murmurs, rubs, or gallops. Abdomen:  Soft, nontender, nondistended.  Without guarding or rebound. Extremities: Without cyanosis, clubbing, edema, or obvious deformity. Vascular:  Dorsalis pedis and posterior tibial pulses palpable bilaterally. Skin:  Warm and dry, no erythema, no ulcerations.  Labs on Admission: I have  personally reviewed following labs and imaging studies  CBC: Recent Labs  Lab 04/18/23 1653 04/21/23 1105  WBC 15.2* 16.0*  NEUTROABS 11.7* 10.7*  HGB 10.1* 9.0*  HCT 31.6* 28.6*  MCV 85.6 87.5  PLT 465* 770*   Basic Metabolic Panel: Recent Labs  Lab 04/18/23 1653 04/21/23 1105  NA 139 141  K 4.5 3.7  CL 103 107  CO2 29 22  GLUCOSE 96 163*  BUN 13 13  CREATININE 0.94 0.69  CALCIUM 8.9 8.8*  MG 1.7  --    GFR: Estimated Creatinine Clearance: 73.1 mL/min (by C-G formula based on SCr of 0.69 mg/dL). Liver Function Tests: Recent Labs  Lab 04/18/23 1653 04/21/23 1105  AST 35 34  ALT 14 18  ALKPHOS 99 89  BILITOT 0.4 0.9  PROT 6.1* 6.5  ALBUMIN 3.3* 2.9*   Recent Labs  Lab 04/18/23 1653 04/21/23 1105  LIPASE 81* 77*   Recent Labs  Lab 04/21/23 1153  AMMONIA 13   Coagulation Profile: Recent Labs  Lab 04/21/23 1046  INR 7.8*   Cardiac Enzymes: No results for input(s): "CKTOTAL", "CKMB", "CKMBINDEX", "TROPONINI" in the last 168 hours. BNP (last 3 results) No results for input(s): "PROBNP" in the last 8760 hours. HbA1C: No results for input(s): "HGBA1C" in the last 72 hours. CBG: No results for input(s): "GLUCAP" in the last 168 hours. Lipid Profile: No results for input(s): "CHOL", "HDL", "LDLCALC", "TRIG", "CHOLHDL", "LDLDIRECT" in the last 72 hours. Thyroid Function Tests: No results for input(s): "TSH", "T4TOTAL", "FREET4", "T3FREE", "THYROIDAB" in the last 72 hours. Anemia Panel: No results for input(s): "VITAMINB12", "FOLATE", "FERRITIN", "TIBC", "IRON", "RETICCTPCT" in the last 72 hours. Urine analysis:    Component Value Date/Time   COLORURINE YELLOW 01/21/2023 1054   APPEARANCEUR CLEAR 01/21/2023 1054   LABSPEC 1.023 01/21/2023 1054   PHURINE 7.0 01/21/2023 1054   GLUCOSEU NEGATIVE 01/21/2023 1054   HGBUR NEGATIVE 01/21/2023 1054   BILIRUBINUR NEGATIVE 01/21/2023 1054   KETONESUR 5 (A) 01/21/2023 1054   PROTEINUR NEGATIVE 01/21/2023  1054   NITRITE NEGATIVE 01/21/2023 1054   LEUKOCYTESUR NEGATIVE 01/21/2023 1054    Radiological Exams on Admission: CT CHEST ABDOMEN PELVIS W CONTRAST Result Date: 04/21/2023 CLINICAL DATA:  Left chest wall bruise. Rule out bleeding or traumatic injury to the chest. Shortness of breath. EXAM: CT CHEST, ABDOMEN, AND PELVIS WITH CONTRAST  TECHNIQUE: Multidetector CT imaging of the chest, abdomen and pelvis was performed following the standard protocol during bolus administration of intravenous contrast. RADIATION DOSE REDUCTION: This exam was performed according to the departmental dose-optimization program which includes automated exposure control, adjustment of the mA and/or kV according to patient size and/or use of iterative reconstruction technique. CONTRAST:  OMNIPAQUE IOHEXOL 300 MG/ML  SOLN COMPARISON:  CT of the abdomen pelvis dated 04/16/2023. FINDINGS: CT CHEST FINDINGS Cardiovascular: There is no cardiomegaly. Small pericardial effusion measuring up to 8 mm in thickness anterior to the heart. There is 3 vessel coronary vascular calcification. Moderate atherosclerotic calcification of the thoracic aorta. No aneurysmal dilatation or dissection. No periaortic fluid collection. The central pulmonary arteries appear patent. Mediastinum/Nodes: No hilar or mediastinal adenopathy. The esophagus is grossly unremarkable. No mediastinal fluid collection. Lungs/Pleura: Linear atelectasis/scarring in the lingula. A 3 mm right apical nodule (29/6). No focal consolidation, pleural effusion, or pneumothorax. The central airways are patent. Musculoskeletal: Degenerative changes of the spine and osteopenia. No acute osseous pathology. There is stranding of the fat plane of the left axilla and shoulder area. No large fluid collection or hematoma. Mild asymmetric enlargement of the left shoulder musculature involving the medial aspect of the subscapularis suspicious for a small intramuscular hematoma. CT ABDOMEN  PELVIS FINDINGS No intra-abdominal free air or free fluid. Hepatobiliary: The liver is unremarkable. No biliary dilatation. The gallbladder is unremarkable. Pancreas: Inflammatory changes of the pancreas in keeping with no pancreatitis. There is dilatation of the main pancreatic duct as on the prior CT. No drainable fluid collection/abscess or pseudocyst. Spleen: Normal in size without focal abnormality.  The uterus Adrenals/Urinary Tract: Glands are unremarkable. There is no hydronephrosis on either side. There is symmetric enhancement and excretion of contrast by both kidneys. The visualized ureters and urinary bladder appear unremarkable. Stomach/Bowel: There is sigmoid diverticulosis without active inflammatory changes. There is no bowel obstruction or active inflammation. The appendix is normal. Vascular/Lymphatic: Moderate aortoiliac atherosclerotic disease. The IVC is unremarkable. No portal venous gas. There is no adenopathy. Reproductive: Mildly enlarged prostate gland measuring 6 cm in transverse axial diameter. The seminal vesicles are symmetric. Other: None Musculoskeletal: Degenerative changes of the hips bilaterally, left greater than right. Osteopenia. Similar appearance of a sclerotic area in the superior aspect of L3, likely a Schmorl's node. No acute osseous pathology. IMPRESSION: 1. Probable small intramuscular hematoma involving the medial left subscapularis muscle. No fluid collection or large hematoma. 2. Acute pancreatitis. No drainable fluid collection/abscess or pseudocyst. 3. Sigmoid diverticulosis. No bowel obstruction. Normal appendix. 4.  Aortic Atherosclerosis (ICD10-I70.0). Electronically Signed   By: Elgie Collard M.D.   On: 04/21/2023 12:39    EKG: Independently reviewed.   Azucena Fallen DO [facility] For contact please use secure messenger on Epic  If 7PM-7AM, please contact night-coverage located on [email or web address]   04/21/2023, 1:18 PM

## 2023-04-21 NOTE — ED Provider Notes (Signed)
[place] EMERGENCY DEPARTMENT AT [facility] Provider Note   CSN: [id number] Arrival date & time: 04/21/23  1027     History  Chief Complaint  Patient presents with   Bleeding/Bruising    Christian Crawford is a 89 y.o. male.  The history is provided by the patient, medical records and a relative. No language interpreter was used.  Rash Location:  Torso and shoulder/arm Shoulder/arm rash location:  L shoulder and L elbow Torso rash location:  L axilla and L chest Quality: bruising   Severity:  Moderate Onset quality:  Gradual Duration:  1 day Timing:  Constant Progression:  Unchanged Chronicity:  New Relieved by:  Nothing Worsened by:  Nothing Ineffective treatments:  None tried Associated symptoms: no abdominal pain, no diarrhea, no fatigue, no fever, no headaches, no nausea, no shortness of breath, not vomiting and not wheezing        Home Medications Prior to Admission medications   Medication Sig Start Date End Date Taking? Authorizing Provider  acetaminophen (TYLENOL) 650 MG CR tablet Take 650 mg by mouth every 8 (eight) hours as needed for pain. Patient not taking: Reported on 04/11/2023    [provider]  albuterol (VENTOLIN HFA) 108 (90 Base) MCG/ACT inhaler Inhale into the lungs. Patient not taking: Reported on 04/11/2023 11/12/21   [provider]  allopurinol (ZYLOPRIM) 100 MG tablet Take 100 mg by mouth 2 (two) times daily.    [provider]  amLODipine (NORVASC) 5 MG tablet Take 2 tablets (10 mg total) by mouth daily. 10/12/22 11/11/22  Rexford Maus, DO  aspirin EC 81 MG tablet Take 81 mg by mouth daily.    [provider]  atorvastatin (LIPITOR) 40 MG tablet Take 40 mg by mouth daily.    [provider]  benzonatate (TESSALON) 100 MG capsule Take 100 mg by mouth 3 (three) times daily as needed for cough. Patient not taking: Reported on 04/11/2023    [provider]  cephALEXin (KEFLEX)  500 MG capsule Take 1 capsule (500 mg total) by mouth 4 (four) times daily. Patient not taking: Reported on 04/11/2023 10/12/22   Elayne Snare K, DO  FEROSUL 325 (65 Fe) MG tablet Take 325 mg by mouth 2 (two) times daily. Patient not taking: Reported on 04/11/2023 11/01/21   [provider]  latanoprost (XALATAN) 0.005 % ophthalmic solution Place 1 drop into both eyes at bedtime. 06/10/19   [provider]  metFORMIN (GLUCOPHAGE) 500 MG tablet Take 1 tablet (500 mg total) by mouth 2 (two) times daily with a meal. 10/12/22 11/11/22  Elayne Snare K, DO  Metoprolol Tartrate 75 MG TABS Take 75 mg by mouth in the morning and at bedtime.    [provider]  Multiple Vitamins-Minerals (MULTIVITAMIN WITH MINERALS) tablet Take 1 tablet by mouth daily.    [provider]  ondansetron (ZOFRAN) 4 MG tablet Take 1 tablet (4 mg total) by mouth every 4 (four) hours as needed for nausea or vomiting. 04/11/23   Cirigliano, Vito V, DO  oxyCODONE (ROXICODONE) 5 MG immediate release tablet Take 1 tablet (5 mg total) by mouth every 4 (four) hours as needed for severe pain (pain score 7-10). Patient not taking: Reported on 04/11/2023 01/21/23   Tanda Rockers A, DO  pantoprazole (PROTONIX) 40 MG tablet Take 40 mg by mouth daily. Patient not taking: Reported on 04/11/2023    [provider]  pantoprazole (PROTONIX) 40 MG tablet Take 1 tablet (40 mg total)  by mouth 2 (two) times daily for 14 days. 01/21/23 02/04/23  Sloan Leiter, DO  sucralfate (CARAFATE) 1 g tablet Take 1 tablet (1 g total) by mouth with breakfast, with lunch, and with evening meal for 7 days. 01/21/23 01/28/23  Tanda Rockers A, DO  warfarin (COUMADIN) 5 MG tablet Take 1 tablet (5 mg total) by mouth daily at 4 PM. 07/26/19   Shahmehdi, Gemma Payor, MD      Allergies    Patient has no known allergies.    Review of Systems   Review of Systems  Constitutional:  Negative for chills, fatigue and fever.  HENT:   Negative for congestion.   Respiratory:  Negative for cough, chest tightness, shortness of breath and wheezing.   Cardiovascular:  Negative for chest pain.  Gastrointestinal:  Negative for abdominal pain, diarrhea, nausea and vomiting.  Genitourinary:  Negative for dysuria and flank pain.  Musculoskeletal:  Negative for back pain, neck pain and neck stiffness.  Skin:  Positive for color change and rash.  Neurological:  Negative for headaches.  Psychiatric/Behavioral:  Negative for agitation.   All other systems reviewed and are negative.   Physical Exam Updated Vital Signs BP 129/88 (BP Location: Right Arm)   Pulse (!) 126   Temp 97.8 F (36.6 C) (Oral)   Resp (!) 23   Ht 6\' 1"  (1.854 m)   Wt 79.4 kg   SpO2 100%   BMI 23.09 kg/m  Physical Exam Vitals and nursing note reviewed.  Constitutional:      General: He is not in acute distress.    Appearance: He is well-developed. He is not ill-appearing, toxic-appearing or diaphoretic.  HENT:     Head: Normocephalic and atraumatic.     Nose: No congestion or rhinorrhea.  Eyes:     Conjunctiva/sclera: Conjunctivae normal.  Cardiovascular:     Rate and Rhythm: Regular rhythm. Tachycardia present.     Heart sounds: No murmur heard. Pulmonary:     Effort: Pulmonary effort is normal. No respiratory distress.     Breath sounds: Normal breath sounds. No wheezing, rhonchi or rales.  Chest:     Chest wall: No tenderness.  Abdominal:     General: Abdomen is flat. There is no distension.     Palpations: Abdomen is soft.     Tenderness: There is no abdominal tenderness. There is no right CVA tenderness, left CVA tenderness, guarding or rebound.  Musculoskeletal:        General: No swelling or tenderness.     Cervical back: Neck supple. No tenderness.     Right lower leg: No edema.     Left lower leg: No edema.  Skin:    General: Skin is warm and dry.     Capillary Refill: Capillary refill takes less than 2 seconds.     Findings:  Bruising present.  Neurological:     General: No focal deficit present.     Mental Status: He is alert.     Sensory: No sensory deficit.     Motor: No weakness.  Psychiatric:        Mood and Affect: Mood normal.         ED Results / Procedures / Treatments   Labs (all labs ordered are listed, but only abnormal results are displayed) Labs Reviewed  PROTIME-INR - Abnormal; Notable for the following components:      Result Value   Prothrombin Time 65.8 (*)    INR 7.8 (*)  All other components within normal limits  CBC WITH DIFFERENTIAL/PLATELET - Abnormal; Notable for the following components:   WBC 16.0 (*)    RBC 3.27 (*)    Hemoglobin 9.0 (*)    HCT 28.6 (*)    Platelets 770 (*)    Neutro Abs 10.7 (*)    Abs Immature Granulocytes 0.14 (*)    All other components within normal limits  COMPREHENSIVE METABOLIC PANEL - Abnormal; Notable for the following components:   Glucose, Bld 163 (*)    Calcium 8.8 (*)    Albumin 2.9 (*)    All other components within normal limits  LIPASE, BLOOD - Abnormal; Notable for the following components:   Lipase 77 (*)    All other components within normal limits  AMMONIA  TROPONIN I (HIGH SENSITIVITY)  TROPONIN I (HIGH SENSITIVITY)    EKG EKG Interpretation Date/Time:  Monday April 21 2023 10:40:40 EST Ventricular Rate:  127 PR Interval:  327 QRS Duration:  80 QT Interval:  305 QTC Calculation: 444 R Axis:   75  Text Interpretation: Sinus tachycardia Prolonged PR interval Consider left atrial enlargement when compared to prior faster rate. No STEMI Confirmed by Theda Belfast ([id number]) on 04/21/2023 11:05:13 AM  Radiology CT CHEST ABDOMEN PELVIS W CONTRAST Result Date: 04/21/2023 CLINICAL DATA:  Left chest wall bruise. Rule out bleeding or traumatic injury to the chest. Shortness of breath. EXAM: CT CHEST, ABDOMEN, AND PELVIS WITH CONTRAST TECHNIQUE: Multidetector CT imaging of the chest, abdomen and pelvis was performed  following the standard protocol during bolus administration of intravenous contrast. RADIATION DOSE REDUCTION: This exam was performed according to the departmental dose-optimization program which includes automated exposure control, adjustment of the mA and/or kV according to patient size and/or use of iterative reconstruction technique. CONTRAST:  OMNIPAQUE IOHEXOL 300 MG/ML  SOLN COMPARISON:  CT of the abdomen pelvis dated 04/16/2023. FINDINGS: CT CHEST FINDINGS Cardiovascular: There is no cardiomegaly. Small pericardial effusion measuring up to 8 mm in thickness anterior to the heart. There is 3 vessel coronary vascular calcification. Moderate atherosclerotic calcification of the thoracic aorta. No aneurysmal dilatation or dissection. No periaortic fluid collection. The central pulmonary arteries appear patent. Mediastinum/Nodes: No hilar or mediastinal adenopathy. The esophagus is grossly unremarkable. No mediastinal fluid collection. Lungs/Pleura: Linear atelectasis/scarring in the lingula. A 3 mm right apical nodule (29/6). No focal consolidation, pleural effusion, or pneumothorax. The central airways are patent. Musculoskeletal: Degenerative changes of the spine and osteopenia. No acute osseous pathology. There is stranding of the fat plane of the left axilla and shoulder area. No large fluid collection or hematoma. Mild asymmetric enlargement of the left shoulder musculature involving the medial aspect of the subscapularis suspicious for a small intramuscular hematoma. CT ABDOMEN PELVIS FINDINGS No intra-abdominal free air or free fluid. Hepatobiliary: The liver is unremarkable. No biliary dilatation. The gallbladder is unremarkable. Pancreas: Inflammatory changes of the pancreas in keeping with no pancreatitis. There is dilatation of the main pancreatic duct as on the prior CT. No drainable fluid collection/abscess or pseudocyst. Spleen: Normal in size without focal abnormality.  The uterus  Adrenals/Urinary Tract: Glands are unremarkable. There is no hydronephrosis on either side. There is symmetric enhancement and excretion of contrast by both kidneys. The visualized ureters and urinary bladder appear unremarkable. Stomach/Bowel: There is sigmoid diverticulosis without active inflammatory changes. There is no bowel obstruction or active inflammation. The appendix is normal. Vascular/Lymphatic: Moderate aortoiliac atherosclerotic disease. The IVC is unremarkable. No portal venous gas. There is  no adenopathy. Reproductive: Mildly enlarged prostate gland measuring 6 cm in transverse axial diameter. The seminal vesicles are symmetric. Other: None Musculoskeletal: Degenerative changes of the hips bilaterally, left greater than right. Osteopenia. Similar appearance of a sclerotic area in the superior aspect of L3, likely a Schmorl's node. No acute osseous pathology. IMPRESSION: 1. Probable small intramuscular hematoma involving the medial left subscapularis muscle. No fluid collection or large hematoma. 2. Acute pancreatitis. No drainable fluid collection/abscess or pseudocyst. 3. Sigmoid diverticulosis. No bowel obstruction. Normal appendix. 4.  Aortic Atherosclerosis (ICD10-I70.0). Electronically Signed   By: Elgie Collard M.D.   On: 04/21/2023 12:39    Procedures Procedures    CRITICAL CARE Performed by: Canary Brim Granvil Djordjevic Total critical care time: 35 minutes Critical care time was exclusive of separately billable procedures and treating other patients. Critical care was necessary to treat or prevent imminent or life-threatening deterioration. Critical care was time spent personally by me on the following activities: development of treatment plan with patient and/or surrogate as well as nursing, discussions with consultants, evaluation of patient's response to treatment, examination of patient, obtaining history from patient or surrogate, ordering and performing treatments and  interventions, ordering and review of laboratory studies, ordering and review of radiographic studies, pulse oximetry and re-evaluation of patient's condition.  Medications Ordered in ED Medications  phytonadione (VITAMIN K) 5 mg in dextrose 5 % 50 mL IVPB (has no administration in time range)  iohexol (OMNIPAQUE) 300 MG/ML solution 100 mL (100 mLs Intravenous Contrast Given 04/21/23 1202)    ED Course/ Medical Decision Making/ A&P                                 Medical Decision Making Amount and/or Complexity of Data Reviewed Labs: ordered. Radiology: ordered.  Risk Prescription drug management. Decision regarding hospitalization.    Christian Crawford is a 89 y.o. male with a past medical history significant for hypertension, previous diverticulitis, atrial fibrillation on Coumadin therapy, and recent pancreatitis who presents with left torso bruising and swelling.  According to patient, he was doing fairly well going to bed last night but woke up this morning with bruising in his left chest and left upper abdominal wall.  He also has some bruising in his left antecubital fossa and slightly in his left axilla and upper arm.  He reports he was stuck multiple times during his workup several days ago in the left arm for blood work.  He reports that there was some bruising now that has improved in the left cubital fossa.  He otherwise denies significant chest pain, shortness of breath abdominal pain nausea or vomiting.  Denies constipation, diarrhea, or urinary changes.  Otherwise feels fairly well but on arrival he is tachycardic and tachypneic with heart rates nearly 130 initially.  On exam, patient does have bruising in his left chest wall and left upper abdomen that is quite impressive for just being there for 1 day.  There was also slight crepitance on the area.  Some tenderness in the left antecubital fossa and left upper axillary area.  Some bruising present there.  Distally he had intact  sensation, strength, and pulses.  Abdomen otherwise nontender.  Bowel sounds appreciated.  Patient had otherwise unremarkable exam.  Had a shared decision-making conversation with patient and family.  Due to this new bruising and his vital signs, we will get imaging to look for hemorrhage or bleeding, worsen pancreatitis that would  cause a Faythe Casa sign, or other traumatic injury or abnormality.  He denies any trauma.  Clinically I suspect that the patient's left antecubital fossa bled sometime overnight possibly from moving it during his sleep and this opened up where he was stuck the other day leading to bleeding to start and track down his arm through the antecubital fossa into his left torso chest/abdominal wall.  I have less suspicion for large pneumonia, rib fracture, or deeper injury however given his vital signs, we will get CT imaging.  Will get CT of the chest/ab/pelvis and will try to include some of the left upper arm as well.  He will get screening labs.  Anticipate discharge if workup does not show any concerning findings and we can likely attribute this to the blood draw and delayed bleeding.     1:09 PM Labs and CT scan have returned.  Patient's hemoglobin appears to have dropped again to 9.0 down from 10.1 and down from 11.9 ten days ago.  His CT scan shows he has a hematoma in his musculature of his left chest wall likely due to his INR being 7.8.  Due to his downtrending hemoglobin, spontaneous bleeding, and elevated INR, he will need admission for managing this.  He still denies any bleeding in his mouth, rectal bleeding, or many symptoms of the anemia at this time.  I spoke to pharmacy who recommended IV vitamin K which we will order.  He will likely hemoglobin trended and INR trended before he is safe for discharge.  Will hold on further imaging or workup at this time and speak to medicine for admission.         Final Clinical Impression(s) / ED Diagnoses Final  diagnoses:  Hematoma  Bruising  Elevated INR  Anemia, unspecified type     Clinical Impression: 1. Hematoma   2. Bruising   3. Elevated INR   4. Anemia, unspecified type     Disposition: Admit  This note was prepared with assistance of Dragon voice recognition software. Occasional wrong-word or sound-a-like substitutions may have occurred due to the inherent limitations of voice recognition software.      Dorlene Footman, Canary Brim, MD 04/21/23 1320

## 2023-04-21 NOTE — ED Notes (Signed)
Daughter at bedside. Phone numbers confirmed for contact.

## 2023-04-21 NOTE — ED Triage Notes (Addendum)
Patient reports he woke up this AM with a large bruise on his left side and SOB. Patient denies injury. Takes warfarin. Patient denies chest pain.

## 2023-04-22 ENCOUNTER — Other Ambulatory Visit (HOSPITAL_COMMUNITY): Payer: Self-pay

## 2023-04-22 ENCOUNTER — Telehealth (HOSPITAL_COMMUNITY): Payer: Self-pay | Admitting: Pharmacy Technician

## 2023-04-22 ENCOUNTER — Emergency Department (HOSPITAL_COMMUNITY): Payer: Medicare HMO

## 2023-04-22 ENCOUNTER — Encounter (HOSPITAL_COMMUNITY): Payer: Self-pay

## 2023-04-22 ENCOUNTER — Other Ambulatory Visit: Payer: Self-pay

## 2023-04-22 ENCOUNTER — Inpatient Hospital Stay (HOSPITAL_COMMUNITY)
Admission: EM | Admit: 2023-04-22 | Discharge: 2023-05-01 | DRG: 813 | Disposition: A | Discharge status: Home Health | Payer: Medicare HMO | Attending: Internal Medicine | Admitting: Internal Medicine

## 2023-04-22 DIAGNOSIS — M7981 Nontraumatic hematoma of soft tissue: Secondary | ICD-10-CM | POA: Diagnosis present

## 2023-04-22 DIAGNOSIS — K859 Acute pancreatitis without necrosis or infection, unspecified: Secondary | ICD-10-CM | POA: Diagnosis present

## 2023-04-22 DIAGNOSIS — D649 Anemia, unspecified: Secondary | ICD-10-CM | POA: Diagnosis present

## 2023-04-22 DIAGNOSIS — E43 Unspecified severe protein-calorie malnutrition: Secondary | ICD-10-CM | POA: Diagnosis present

## 2023-04-22 DIAGNOSIS — Z7982 Long term (current) use of aspirin: Secondary | ICD-10-CM

## 2023-04-22 DIAGNOSIS — E119 Type 2 diabetes mellitus without complications: Secondary | ICD-10-CM | POA: Diagnosis present

## 2023-04-22 DIAGNOSIS — K862 Cyst of pancreas: Secondary | ICD-10-CM | POA: Diagnosis present

## 2023-04-22 DIAGNOSIS — K861 Other chronic pancreatitis: Secondary | ICD-10-CM | POA: Diagnosis present

## 2023-04-22 DIAGNOSIS — D72829 Elevated white blood cell count, unspecified: Secondary | ICD-10-CM | POA: Diagnosis present

## 2023-04-22 DIAGNOSIS — S20212A Contusion of left front wall of thorax, initial encounter: Secondary | ICD-10-CM | POA: Diagnosis present

## 2023-04-22 DIAGNOSIS — Z87891 Personal history of nicotine dependence: Secondary | ICD-10-CM

## 2023-04-22 DIAGNOSIS — R791 Abnormal coagulation profile: Secondary | ICD-10-CM | POA: Diagnosis not present

## 2023-04-22 DIAGNOSIS — Z7901 Long term (current) use of anticoagulants: Secondary | ICD-10-CM

## 2023-04-22 DIAGNOSIS — D75839 Thrombocytosis, unspecified: Secondary | ICD-10-CM | POA: Diagnosis present

## 2023-04-22 DIAGNOSIS — R55 Syncope and collapse: Secondary | ICD-10-CM | POA: Diagnosis not present

## 2023-04-22 DIAGNOSIS — I480 Paroxysmal atrial fibrillation: Secondary | ICD-10-CM | POA: Diagnosis present

## 2023-04-22 DIAGNOSIS — H409 Unspecified glaucoma: Secondary | ICD-10-CM | POA: Diagnosis present

## 2023-04-22 DIAGNOSIS — Z800 Family history of malignant neoplasm of digestive organs: Secondary | ICD-10-CM

## 2023-04-22 DIAGNOSIS — I10 Essential (primary) hypertension: Secondary | ICD-10-CM | POA: Diagnosis present

## 2023-04-22 DIAGNOSIS — Z7984 Long term (current) use of oral hypoglycemic drugs: Secondary | ICD-10-CM

## 2023-04-22 DIAGNOSIS — E876 Hypokalemia: Secondary | ICD-10-CM | POA: Diagnosis not present

## 2023-04-22 DIAGNOSIS — Z79899 Other long term (current) drug therapy: Secondary | ICD-10-CM

## 2023-04-22 DIAGNOSIS — I482 Chronic atrial fibrillation, unspecified: Secondary | ICD-10-CM | POA: Diagnosis present

## 2023-04-22 DIAGNOSIS — I700 Atherosclerosis of aorta: Secondary | ICD-10-CM | POA: Diagnosis present

## 2023-04-22 DIAGNOSIS — R933 Abnormal findings on diagnostic imaging of other parts of digestive tract: Secondary | ICD-10-CM | POA: Diagnosis present

## 2023-04-22 DIAGNOSIS — D6832 Hemorrhagic disorder due to extrinsic circulating anticoagulants: Principal | ICD-10-CM | POA: Diagnosis present

## 2023-04-22 DIAGNOSIS — I7772 Dissection of iliac artery: Secondary | ICD-10-CM | POA: Diagnosis present

## 2023-04-22 DIAGNOSIS — Z8673 Personal history of transient ischemic attack (TIA), and cerebral infarction without residual deficits: Secondary | ICD-10-CM

## 2023-04-22 DIAGNOSIS — D638 Anemia in other chronic diseases classified elsewhere: Secondary | ICD-10-CM | POA: Diagnosis present

## 2023-04-22 DIAGNOSIS — R935 Abnormal findings on diagnostic imaging of other abdominal regions, including retroperitoneum: Secondary | ICD-10-CM

## 2023-04-22 LAB — CBG MONITORING, ED
Glucose-Capillary: 129 mg/dL — ABNORMAL HIGH (ref 70–99)
Glucose-Capillary: 144 mg/dL — ABNORMAL HIGH (ref 70–99)
Glucose-Capillary: 154 mg/dL — ABNORMAL HIGH (ref 70–99)

## 2023-04-22 LAB — I-STAT CG4 LACTIC ACID, ED: Lactic Acid, Venous: 1.8 mmol/L (ref 0.5–1.9)

## 2023-04-22 LAB — COMPREHENSIVE METABOLIC PANEL
ALT: 16 U/L (ref 0–44)
AST: 24 U/L (ref 15–41)
Albumin: 2.6 g/dL — ABNORMAL LOW (ref 3.5–5.0)
Alkaline Phosphatase: 86 U/L (ref 38–126)
Anion gap: 7 (ref 5–15)
BUN: 9 mg/dL (ref 8–23)
CO2: 24 mmol/L (ref 22–32)
Calcium: 8 mg/dL — ABNORMAL LOW (ref 8.9–10.3)
Chloride: 103 mmol/L (ref 98–111)
Creatinine, Ser: 0.58 mg/dL — ABNORMAL LOW (ref 0.61–1.24)
GFR, Estimated: >60 mL/min (ref >60)
Glucose, Bld: 138 mg/dL — ABNORMAL HIGH (ref 70–99)
Potassium: 3.5 mmol/L (ref 3.5–5.1)
Sodium: 134 mmol/L — ABNORMAL LOW (ref 135–145)
Total Bilirubin: 1 mg/dL (ref 0.0–1.2)
Total Protein: 6.1 g/dL — ABNORMAL LOW (ref 6.5–8.1)

## 2023-04-22 LAB — BASIC METABOLIC PANEL
Anion gap: 11 (ref 5–15)
BUN: 10 mg/dL (ref 8–23)
CO2: 21 mmol/L — ABNORMAL LOW (ref 22–32)
Calcium: 8.3 mg/dL — ABNORMAL LOW (ref 8.9–10.3)
Chloride: 104 mmol/L (ref 98–111)
Creatinine, Ser: 0.56 mg/dL — ABNORMAL LOW (ref 0.61–1.24)
GFR, Estimated: >60 mL/min (ref >60)
Glucose, Bld: 186 mg/dL — ABNORMAL HIGH (ref 70–99)
Potassium: 4 mmol/L (ref 3.5–5.1)
Sodium: 136 mmol/L (ref 135–145)

## 2023-04-22 LAB — PROTIME-INR
INR: 1.6 — ABNORMAL HIGH (ref 0.8–1.2)
INR: 1.5 — ABNORMAL HIGH (ref 0.8–1.2)
INR: 1.4 — ABNORMAL HIGH (ref 0.8–1.2)
Prothrombin Time: 19.5 s — ABNORMAL HIGH (ref 11.4–15.2)
Prothrombin Time: 18 s — ABNORMAL HIGH (ref 11.4–15.2)
Prothrombin Time: 17.4 s — ABNORMAL HIGH (ref 11.4–15.2)

## 2023-04-22 LAB — CBC
HCT: 26.2 % — ABNORMAL LOW (ref 39.0–52.0)
HCT: 28.7 % — ABNORMAL LOW (ref 39.0–52.0)
Hemoglobin: 8.4 g/dL — ABNORMAL LOW (ref 13.0–17.0)
Hemoglobin: 8.9 g/dL — ABNORMAL LOW (ref 13.0–17.0)
MCH: 27.5 pg (ref 26.0–34.0)
MCH: 27.3 pg (ref 26.0–34.0)
MCHC: 32.1 g/dL (ref 30.0–36.0)
MCHC: 31 g/dL (ref 30.0–36.0)
MCV: 85.6 fL (ref 80.0–100.0)
MCV: 88 fL (ref 80.0–100.0)
Platelets: 515 10*3/uL — ABNORMAL HIGH (ref 150–400)
Platelets: 659 10*3/uL — ABNORMAL HIGH (ref 150–400)
RBC: 3.06 MIL/uL — ABNORMAL LOW (ref 4.22–5.81)
RBC: 3.26 MIL/uL — ABNORMAL LOW (ref 4.22–5.81)
RDW: 14.6 % (ref 11.5–15.5)
RDW: 14.7 % (ref 11.5–15.5)
WBC: 17.1 10*3/uL — ABNORMAL HIGH (ref 4.0–10.5)
WBC: 21.5 10*3/uL — ABNORMAL HIGH (ref 4.0–10.5)
nRBC: 0 % (ref 0.0–0.2)
nRBC: 0 % (ref 0.0–0.2)

## 2023-04-22 LAB — HEPATIC FUNCTION PANEL
ALT: 16 U/L (ref 0–44)
AST: 28 U/L (ref 15–41)
Albumin: 2.7 g/dL — ABNORMAL LOW (ref 3.5–5.0)
Alkaline Phosphatase: 86 U/L (ref 38–126)
Bilirubin, Direct: 0.2 mg/dL (ref 0.0–0.2)
Indirect Bilirubin: 0.9 mg/dL (ref 0.3–0.9)
Total Bilirubin: 1.1 mg/dL (ref 0.0–1.2)
Total Protein: 6.3 g/dL — ABNORMAL LOW (ref 6.5–8.1)

## 2023-04-22 LAB — LIPID PANEL
Cholesterol: 86 mg/dL (ref 0–200)
HDL: 32 mg/dL — ABNORMAL LOW (ref >40)
LDL Cholesterol: 39 mg/dL (ref 0–99)
Total CHOL/HDL Ratio: 2.7 {ratio}
Triglycerides: 76 mg/dL (ref <150)
VLDL: 15 mg/dL (ref 0–40)

## 2023-04-22 LAB — TROPONIN I (HIGH SENSITIVITY)
Troponin I (High Sensitivity): 5 ng/L
Troponin I (High Sensitivity): 5 ng/L (ref <18)

## 2023-04-22 LAB — LIPASE, BLOOD: Lipase: 49 U/L (ref 11–51)

## 2023-04-22 MED ORDER — INSULIN ASPART 100 UNIT/ML IJ SOLN
0.0000 [IU] | INTRAMUSCULAR | Status: DC
  Administered 2023-04-23: 1 [IU] via SUBCUTANEOUS
  Filled 2023-04-22: qty 0.06

## 2023-04-22 MED ORDER — METOPROLOL TARTRATE 50 MG PO TABS
75.0000 mg | ORAL_TABLET | Freq: Two times a day (BID) | ORAL | Status: DC
  Administered 2023-04-22 – 2023-05-01 (×18): 75 mg via ORAL
  Filled 2023-04-22 (×7): qty 1
  Filled 2023-04-22: qty 3
  Filled 2023-04-22 (×5): qty 1
  Filled 2023-04-22: qty 3
  Filled 2023-04-22 (×4): qty 1

## 2023-04-22 MED ORDER — PIPERACILLIN-TAZOBACTAM 3.375 G IVPB
3.3750 g | Freq: Three times a day (TID) | INTRAVENOUS | Status: DC
  Administered 2023-04-22 – 2023-04-28 (×17): 3.375 g via INTRAVENOUS
  Filled 2023-04-22 (×17): qty 50

## 2023-04-22 MED ORDER — WARFARIN - PHARMACIST DOSING INPATIENT
Freq: Every day | Status: DC
Start: 2023-04-23 — End: 2023-05-01

## 2023-04-22 MED ORDER — SODIUM CHLORIDE 0.9 % IV BOLUS
500.0000 mL | Freq: Once | INTRAVENOUS | Status: AC
  Administered 2023-04-22: 500 mL via INTRAVENOUS

## 2023-04-22 MED ORDER — AMLODIPINE BESYLATE 10 MG PO TABS
10.0000 mg | ORAL_TABLET | Freq: Every day | ORAL | Status: DC
  Administered 2023-04-23 – 2023-05-01 (×9): 10 mg via ORAL
  Filled 2023-04-22 (×4): qty 1
  Filled 2023-04-22: qty 2
  Filled 2023-04-22 (×4): qty 1

## 2023-04-22 MED ORDER — WARFARIN SODIUM 10 MG PO TABS
10.0000 mg | ORAL_TABLET | Freq: Once | ORAL | Status: DC
  Filled 2023-04-22: qty 1

## 2023-04-22 MED ORDER — ENOXAPARIN SODIUM 80 MG/0.8ML IJ SOSY: 1.0000 mg/kg | PREFILLED_SYRINGE | Freq: Two times a day (BID) | INTRAMUSCULAR | 0 refills | Status: DC

## 2023-04-22 MED ORDER — LOSARTAN POTASSIUM 50 MG PO TABS
25.0000 mg | ORAL_TABLET | Freq: Every day | ORAL | Status: DC
  Administered 2023-04-23 – 2023-05-01 (×9): 25 mg via ORAL
  Filled 2023-04-22 (×9): qty 1

## 2023-04-22 MED ORDER — WARFARIN SODIUM 10 MG PO TABS
10.0000 mg | ORAL_TABLET | Freq: Once | ORAL | Status: AC
Start: 2023-04-22 — End: 2023-04-22
  Administered 2023-04-22: 10 mg via ORAL
  Filled 2023-04-22: qty 1

## 2023-04-22 MED ORDER — WARFARIN - PHARMACIST DOSING INPATIENT: Freq: Every day | Status: DC

## 2023-04-22 MED ORDER — THIAMINE HCL 100 MG/ML IJ SOLN
100.0000 mg | Freq: Every day | INTRAMUSCULAR | Status: DC
  Administered 2023-04-22 – 2023-04-25 (×4): 100 mg via INTRAVENOUS
  Filled 2023-04-22 (×4): qty 2

## 2023-04-22 MED ORDER — IOHEXOL 350 MG/ML SOLN
100.0000 mL | Freq: Once | INTRAVENOUS | Status: AC | PRN
  Administered 2023-04-22: 100 mL via INTRAVENOUS

## 2023-04-22 NOTE — ED Provider Notes (Signed)
Emergency Department Provider Note   I have reviewed the triage vital signs and the nursing notes.   HISTORY  Chief Complaint Loss of Consciousness   HPI Christian Crawford is a 89 y.o. male with past history of A-fib, hypertension, prior TIA presents to the emergency department for evaluation of syncope event.  He was discharged from the hospital earlier today with supratherapeutic INR.  Lab values normalized and he was discharged.  He states that he stopped the store began to feel lightheaded and was told he had a syncope event.  He does not recall fully losing consciousness.  He states that a store employee helped lower him to the ground he did not strike his head.  He states prior to discharge she did not eat because he was missing his top plate of dentures.  No symptoms currently.  Denies any chest pain, fevers, shortness of breath, palpitations.   Past Medical History:  Diagnosis Date   A-fib [facility])    Acute alcoholic pancreatitis 07/24/2019   Diverticulitis 11/13/2021   Heavy alcohol use 07/24/2019   Hematuria 11/14/2021   Hypertension    TIA (transient ischemic attack)     Review of Systems  Constitutional: No fever/chills Cardiovascular: Denies chest pain. Positive syncope.  Respiratory: Denies shortness of breath. Gastrointestinal: No abdominal pain.  No nausea, no vomiting.  No diarrhea.  Musculoskeletal: Negative for back pain. Skin: Negative for rash. Neurological: Negative for headaches, focal weakness or numbness.  ____________________________________________   PHYSICAL EXAM:  VITAL SIGNS: ED Triage Vitals  Encounter Vitals Group     BP 04/22/23 1332 101/88     Pulse Rate 04/22/23 1332 (!) 120     Resp 04/22/23 1332 18     Temp 04/22/23 1332 97.6 F (36.4 C)     Temp Source 04/22/23 1332 Oral     SpO2 04/22/23 1332 100 %     Weight 04/22/23 1325 174 lb 2.6 oz (79 kg)     Height 04/22/23 1325 6\' 1"  (1.854 m)   Constitutional: Alert and oriented.  Well appearing and in no acute distress. Eyes: Conjunctivae are normal. PERRL. Head: Atraumatic. Nose: No congestion/rhinnorhea. Mouth/Throat: Mucous membranes are moist.   Neck: No stridor. No cervical spine tenderness to palpation. Cardiovascular: Normal rate, regular rhythm. Good peripheral circulation. Grossly normal heart sounds.   Respiratory: Normal respiratory effort.  No retractions. Lungs CTAB. Gastrointestinal: Soft and nontender. No distention.  Musculoskeletal: No lower extremity tenderness nor edema. No gross deformities of extremities. Normal ROM of the bilateral upper and lower extremities.  Neurologic:  Normal speech and language. No gross focal neurologic deficits are appreciated.  Skin:  Skin is warm, dry and intact. No rash noted.  ____________________________________________   LABS (all labs ordered are listed, but only abnormal results are displayed)  Labs Reviewed  CULTURE, BLOOD (ROUTINE X 2) - Abnormal; Notable for the following components:      Result Value   Culture   (*)    Value: STAPHYLOCOCCUS HAEMOLYTICUS THE SIGNIFICANCE OF ISOLATING THIS ORGANISM FROM A SINGLE SET OF BLOOD CULTURES WHEN MULTIPLE SETS ARE DRAWN IS UNCERTAIN. PLEASE NOTIFY THE MICROBIOLOGY DEPARTMENT WITHIN ONE WEEK IF SPECIATION AND SENSITIVITIES ARE REQUIRED. Performed at [facility], [address], [place], [place] [address]    All other components within normal limits  BLOOD CULTURE ID PANEL (REFLEXED) - BCID2 - Abnormal; Notable for the following components:   Streptococcus species DETECTED (*)    All other components within normal limits  BLOOD CULTURE ID PANEL (REFLEXED) - BCID2 - Abnormal; Notable for the following components:   Staphylococcus species DETECTED (*)    All other components within normal limits  BASIC METABOLIC PANEL - Abnormal; Notable for the following components:   CO2 21 (*)    Glucose, Bld 186 (*)    Creatinine, Ser 0.56 (*)    Calcium 8.3 (*)     All other components within normal limits  CBC - Abnormal; Notable for the following components:   WBC 21.5 (*)    RBC 3.26 (*)    Hemoglobin 8.9 (*)    HCT 28.7 (*)    Platelets 659 (*)    All other components within normal limits  URINALYSIS, ROUTINE W REFLEX MICROSCOPIC - Abnormal; Notable for the following components:   Specific Gravity, Urine 1.039 (*)    All other components within normal limits  PROTIME-INR - Abnormal; Notable for the following components:   Prothrombin Time 17.4 (*)    INR 1.4 (*)    All other components within normal limits  HEPATIC FUNCTION PANEL - Abnormal; Notable for the following components:   Total Protein 6.3 (*)    Albumin 2.7 (*)    All other components within normal limits  LIPID PANEL - Abnormal; Notable for the following components:   HDL 32 (*)    All other components within normal limits  COMPREHENSIVE METABOLIC PANEL - Abnormal; Notable for the following components:   Glucose, Bld 146 (*)    Creatinine, Ser 0.33 (*)    Calcium 8.6 (*)    Total Protein 6.3 (*)    Albumin 2.9 (*)    All other components within normal limits  MAGNESIUM - Abnormal; Notable for the following components:   Magnesium 1.6 (*)    All other components within normal limits  CBC WITH DIFFERENTIAL/PLATELET - Abnormal; Notable for the following components:   WBC 17.8 (*)    RBC 3.01 (*)    Hemoglobin 8.3 (*)    HCT 25.8 (*)    Platelets 607 (*)    Neutro Abs 13.0 (*)    Monocytes Absolute 1.3 (*)    Abs Immature Granulocytes 0.13 (*)    All other components within normal limits  PROTIME-INR - Abnormal; Notable for the following components:   Prothrombin Time 21.9 (*)    INR 1.9 (*)    All other components within normal limits  GLUCOSE, CAPILLARY - Abnormal; Notable for the following components:   Glucose-Capillary 153 (*)    All other components within normal limits  PROTIME-INR - Abnormal; Notable for the following components:   Prothrombin Time 24.1 (*)     INR 2.1 (*)    All other components within normal limits  CBC WITH DIFFERENTIAL/PLATELET - Abnormal; Notable for the following components:   WBC 14.0 (*)    RBC 2.80 (*)    Hemoglobin 7.7 (*)    HCT 24.3 (*)    Platelets 570 (*)    Neutro Abs 8.6 (*)    Monocytes Absolute 1.1 (*)    Abs Immature Granulocytes 0.10 (*)    All other components within normal limits  BASIC METABOLIC PANEL - Abnormal; Notable for the following components:   Potassium 3.1 (*)    Glucose, Bld 114 (*)    Calcium 8.2 (*)    All other components within normal limits  GLUCOSE, CAPILLARY - Abnormal; Notable for the following components:   Glucose-Capillary 150 (*)    All other components within normal  limits  GLUCOSE, CAPILLARY - Abnormal; Notable for the following components:   Glucose-Capillary 118 (*)    All other components within normal limits  GLUCOSE, CAPILLARY - Abnormal; Notable for the following components:   Glucose-Capillary 127 (*)    All other components within normal limits  GLUCOSE, CAPILLARY - Abnormal; Notable for the following components:   Glucose-Capillary 113 (*)    All other components within normal limits  PROTIME-INR - Abnormal; Notable for the following components:   Prothrombin Time 28.8 (*)    INR 2.7 (*)    All other components within normal limits  GLUCOSE, CAPILLARY - Abnormal; Notable for the following components:   Glucose-Capillary 143 (*)    All other components within normal limits  GLUCOSE, CAPILLARY - Abnormal; Notable for the following components:   Glucose-Capillary 107 (*)    All other components within normal limits  CBG MONITORING, ED - Abnormal; Notable for the following components:   Glucose-Capillary 154 (*)    All other components within normal limits  CBG MONITORING, ED - Abnormal; Notable for the following components:   Glucose-Capillary 144 (*)    All other components within normal limits  CBG MONITORING, ED - Abnormal; Notable for the following  components:   Glucose-Capillary 121 (*)    All other components within normal limits  CBG MONITORING, ED - Abnormal; Notable for the following components:   Glucose-Capillary 133 (*)    All other components within normal limits  CBG MONITORING, ED - Abnormal; Notable for the following components:   Glucose-Capillary 126 (*)    All other components within normal limits  CBG MONITORING, ED - Abnormal; Notable for the following components:   Glucose-Capillary 134 (*)    All other components within normal limits  CULTURE, BLOOD (ROUTINE X 2)  RESPIRATORY PANEL BY PCR  LIPASE, BLOOD  GAMMA GT  ETHANOL  PHOSPHORUS  CBC WITH DIFFERENTIAL/PLATELET  BASIC METABOLIC PANEL  I-STAT CG4 LACTIC ACID, ED  I-STAT CG4 LACTIC ACID, ED  TYPE AND SCREEN  ABO/RH  TROPONIN I (HIGH SENSITIVITY)  TROPONIN I (HIGH SENSITIVITY)   ____________________________________________  EKG   EKG Interpretation Date/Time:  Tuesday April 22 2023 13:30:20 EST Ventricular Rate:  111 PR Interval:  159 QRS Duration:  69 QT Interval:  378 QTC Calculation: 514 R Axis:   65  Text Interpretation: Sinus tachycardia Atrial premature complexes Anteroseptal infarct, age indeterminate Prolonged QT interval Confirmed by Alona Bene [phone]) on 04/22/2023 3:50:29 PM       ____________________________________________   PROCEDURES  Procedure(s) performed:   Procedures  None  ____________________________________________   INITIAL IMPRESSION / ASSESSMENT AND PLAN / ED COURSE  Pertinent labs & imaging results that were available during my care of the patient were reviewed by me and considered in my medical decision making (see chart for details).   This patient is Presenting for Evaluation of syncope, which does require a range of treatment options, and is a complaint that involves a high risk of morbidity and mortality.  The Differential Diagnoses includes but is not exclusive to acute coronary syndrome,  aortic dissection, pulmonary embolism, cardiac tamponade, community-acquired pneumonia, pericarditis, musculoskeletal chest wall pain, etc.   Critical Interventions-    Medications  thiamine (VITAMIN B1) injection 100 mg (100 mg Intravenous Given 04/24/23 1034)  metoprolol tartrate (LOPRESSOR) tablet 75 mg (75 mg Oral Given 04/24/23 2138)  losartan (COZAAR) tablet 25 mg (25 mg Oral Given 04/24/23 1031)  amLODipine (NORVASC) tablet 10 mg (10 mg Oral Given  04/24/23 1031)  piperacillin-tazobactam (ZOSYN) IVPB 3.375 g (3.375 g Intravenous New Bag/Given 04/25/23 0617)  Warfarin - Pharmacist Dosing Inpatient ( Does not apply Duplicate 04/24/23 1600)  insulin aspart (novoLOG) injection 0-6 Units ( Subcutaneous Not Given 04/25/23 0836)  sodium chloride 0.9 % bolus 500 mL (0 mLs Intravenous Stopped 04/22/23 2129)  iohexol (OMNIPAQUE) 350 MG/ML injection 100 mL (100 mLs Intravenous Contrast Given 04/22/23 2047)  warfarin (COUMADIN) tablet 10 mg (10 mg Oral Given 04/22/23 2346)  warfarin (COUMADIN) tablet 6 mg (6 mg Oral Given 04/24/23 1038)  magnesium sulfate IVPB 4 g 100 mL (4 g Intravenous New Bag/Given 04/24/23 1235)  potassium chloride SA (KLOR-CON M) CR tablet 40 mEq (40 mEq Oral Given 04/24/23 1532)    Reassessment after intervention:  symptoms unchanged.    I did obtain Additional Historical Information from EMS.   I decided to review pertinent External Data, and in summary patient discharged earlier today from [facility] service.   Clinical Laboratory Tests Ordered, included INR 1.4.  Patient with leukocytosis to 21.5.  Noted to have leukocytosis during his prior admission as well along with anemia at 8.9, not significantly changed from labs earlier today at 8.4.   Radiologic Tests Ordered, included CT head and CXR. I independently interpreted the images and agree with radiology interpretation.   Cardiac Monitor Tracing which shows tachycardia.    Social Determinants of Health Risk patient is not an  active smoker.   Consult complete with [facility]. Plan for admit.   Medical Decision Making: Summary:  Patient presents to the emergency department with syncope episode shortly after discharge from the hospital.  No outward sign of trauma.  Overall patient appears well with no obvious traumatic injury.  Plan for screening blood work.  He does have leukocytosis, elevated platelet count, anemia but those are roughly similar to prior.  Will screen for infection.  Doubt sepsis.  Will obtain a lactic acid and follow after IVF.  Patient did not eat breakfast prior to discharge according to him which may have contributed to symptoms but will screen for cardiogenic cause.   Reevaluation with update and discussion with patient and family at bedside. Will admit for further monitoring.   Patient's presentation is most consistent with acute presentation with potential threat to life or bodily function.   Disposition: admit  ____________________________________________  FINAL CLINICAL IMPRESSION(S) / ED DIAGNOSES  Final diagnoses:  Syncope and collapse     Note:  This document was prepared using Dragon voice recognition software and may include unintentional dictation errors.  Alona Bene, MD, [facility] Emergency Medicine    Raman Featherston, Arlyss Repress, MD 04/25/23 [phone]

## 2023-04-22 NOTE — Progress Notes (Signed)
Pharmacy Antibiotic Note  Christian Crawford is a 89 y.o. male admitted on 04/22/2023 with sepsis, suspect biliary source.  Patient discharged earlier today from hospitalization for spontaneous chest wall hematoma.  Pharmacy has been consulted for Zosyn dosing.  Plan: Zosyn 3.375g IV q8h (4 hour infusion). Need for further dosage adjustment appears unlikely at present.    Will sign off at this time.  Please reconsult if a change in clinical status warrants re-evaluation of dosage.    Height: 6\' 1"  (185.4 cm) Weight: 79 kg (174 lb 2.6 oz) IBW/kg (Calculated) : 79.9  Temp (24hrs), Avg:98.2 F (36.8 C), Min:97.6 F (36.4 C), Max:98.9 F (37.2 C)  Recent Labs  Lab 04/18/23 1653 04/21/23 1105 04/22/23 0406 04/22/23 1512 04/22/23 1707  WBC 15.2* 16.0* 17.1* 21.5*  --   CREATININE 0.94 0.69 0.58* 0.56*  --   LATICACIDVEN  --   --   --   --  1.8    Estimated Creatinine Clearance: 72.7 mL/min (A) (by C-G formula based on SCr of 0.56 mg/dL (L)).    Allergies  Allergen Reactions   Lisinopril Cough       Thank you for allowing pharmacy to be a part of this patient's care.  Maryellen Pile, PharmD 04/22/2023 11:10 PM

## 2023-04-22 NOTE — Assessment & Plan Note (Addendum)
Vitals:   04/22/23 1332 04/22/23 1746 04/22/23 1800 04/22/23 1830  BP: 101/88 (!) 152/89 (!) 142/81 137/73   04/22/23 1900 04/22/23 1915 04/22/23 1930 04/22/23 2000  BP: (!) 167/75 (!) 167/75 (!) 151/72 (!) 164/87   04/22/23 2100 04/22/23 2110  BP: (!) 142/73 (!) 142/73  Patient continued on metoprolol, losartan, amlodipine and hydralazine.

## 2023-04-22 NOTE — Telephone Encounter (Signed)
Patient Product/process development scientist completed.    The patient is insured through [employer]. Patient has Medicare and is not eligible for a copay card, but may be able to apply for patient assistance or Medicare RX Payment Plan (Patient Must reach out to their plan, if eligible for payment plan), if available.    Ran test claim for enoxaparin (Lovenox) 80 mg/0.8 ml inj and the current 14 day co-pay is $89.72.   This test claim was processed through [facility]- copay amounts may vary at other pharmacies due to pharmacy/plan contracts, or as the patient moves through the different stages of their insurance plan.     Roland Earl, CPHT Pharmacy Technician III Certified Patient Advocate [facility] Patient Advocate Team Direct Number: [phone]  Fax: [phone]

## 2023-04-22 NOTE — Care Management Obs Status (Signed)
MEDICARE OBSERVATION STATUS NOTIFICATION   Patient Details  Name: Christian Crawford MRN: [id number] Date of Birth: 07/08/1933   Medicare Observation Status Notification Given:  Yes    Princella Ion, LCSW 04/22/2023, 11:13 AM

## 2023-04-22 NOTE — Assessment & Plan Note (Addendum)
Currently in sinus rhythm. Patient continued on Coumadin per pharmacy protocol.

## 2023-04-22 NOTE — Assessment & Plan Note (Addendum)
Suspect secondary to biliary etiology. Will cover with BS iv abx zosyn.

## 2023-04-22 NOTE — H&P (Addendum)
History and Physical    Patient: Christian Crawford JXB:[id number] DOB: 1933-04-23 DOA: 04/22/2023 DOS: the patient was seen and examined on 04/22/2023 PCP: Ellyn Hack, MD  Patient coming from: Home Chief complaint: Chief Complaint  Patient presents with   Loss of Consciousness   HPI:  Christian Crawford is a 89 y.o. male with past medical history  of paroxysmal atrial fibrillation on Coumadin, allergy to lisinopril, history of alcohol abuse none in the past several months, essential hypertension, history of TIA presenting with syncopal episode.  Patient was just discharged earlier today, on way home he stopped at the store was dizzy and the store clerk lowered him to the ground family was with him and was concerned that he may have had a seizure because he was not shaking per report but patient did pass out was unconscious he did not strike his head.  In the emergency room patient answers questions is appropriate except is nonfocal. Chart review shows that patient was admitted yesterday and discharged today for supratherapeutic INR.  No DM II history but pt is on metformin.  Patient was also seen by GI on 17 January of this year for abnormal gallbladder up to 4.3 cm with circumferential wall thickening, also dilation of the pancreatic duct and abnormal MRCP.  Patient did have complaints of epigastric pain nausea vomiting and history of pancreatitis which GI is concerned of acute pancreatitis and acute on chronic pancreatitis and patient was referred to [facility] for endoscopic ultrasound and advanced GI services. >>ED Course: In emergency room patient is alert awake oriented somewhat of a limited historian does not know what happened. Vitals:   04/22/23 2058 04/22/23 2059 04/22/23 2100 04/22/23 2110  BP:   (!) 142/73 (!) 142/73  Pulse:    (!) 109  Temp:    98.1 F (36.7 C)  Resp: (!) 25 (!) 22 19 (!) 22  Height:      Weight:      SpO2:    100%  TempSrc:    Oral  BMI (Calculated):       Patient meets sepsis criteria, source suspect biliary.  ED evaluation  so far shows: Metabolic panel showing glucose 138 normal kidney function normal AST ALT total protein 6.1 albumin 2.6 .  Normal bilirubin.  Lipase 49. Troponin 5 x 2. Lactic acid 1.8. CBC shows leukocytosis of 21.5 hemoglobin of 8.9 and thrombocytosis of 659. INR 1.5 PT 18. Stat CT noncontrast negative for any acute abnormalities chronic small vessel ischemic changes present. CT angio chest abdomen and pelvis done to rule out pulmonary embolism, pericardial effusion, dissection, bleeding and found to have acute pancreatitis, no PE.   MRI noncontrast negative for any acute. Blood cultures collected in the ED.  In the emergency room  pt has received the following treatment thus far: Medications  thiamine (VITAMIN B1) injection 100 mg (100 mg Intravenous Given 04/22/23 2130)  sodium chloride 0.9 % bolus 500 mL (0 mLs Intravenous Stopped 04/22/23 2129)  iohexol (OMNIPAQUE) 350 MG/ML injection 100 mL (100 mLs Intravenous Contrast Given 04/22/23 2047)   Review of Systems  Gastrointestinal:  Negative for abdominal pain, nausea and vomiting.  Neurological:  Positive for loss of consciousness.  All other systems reviewed and are negative.  Past Medical History:  Diagnosis Date   A-fib [facility])    Acute alcoholic pancreatitis 07/24/2019   Diverticulitis 11/13/2021   Heavy alcohol use 07/24/2019   Hematuria 11/14/2021   Hypertension    TIA (transient ischemic  attack)    Past Surgical History:  Procedure Laterality Date   EYE SURGERY     had "droopy eye"    reports that he has quit smoking. His smoking use included cigarettes. He has a 100 pack-year smoking history. He has never used smokeless tobacco. He reports current alcohol use. He reports that he does not use drugs.  Allergies  Allergen Reactions   Lisinopril Cough    Family History  Problem Relation Age of Onset   Colon cancer Brother    Esophageal cancer  Neg Hx    Liver disease Neg Hx     Prior to Admission medications   Medication Sig Start Date End Date Taking? Authorizing Provider  albuterol (VENTOLIN HFA) 108 (90 Base) MCG/ACT inhaler Inhale 1-2 puffs into the lungs every 6 (six) hours as needed for wheezing or shortness of breath. 11/12/21  Yes [provider]  amLODipine (NORVASC) 10 MG tablet Take 10 mg by mouth daily.   Yes [provider]  BOTOX 100 units SOLR injection Inject 100 Units into the muscle See admin instructions. Inject 100 units into the eye muscle every 4 months.   Yes [provider]  latanoprost (XALATAN) 0.005 % ophthalmic solution Place 1 drop into both eyes at bedtime. 06/10/19  Yes [provider]  losartan (COZAAR) 25 MG tablet Take 25 mg by mouth daily.   Yes [provider]  metFORMIN (GLUCOPHAGE-XR) 500 MG 24 hr tablet Take 500 mg by mouth in the morning and at bedtime.   Yes [provider]  metoprolol tartrate (LOPRESSOR) 50 MG tablet Take 75 mg by mouth in the morning and at bedtime.   Yes [provider]  ondansetron (ZOFRAN) 4 MG tablet Take 1 tablet (4 mg total) by mouth every 4 (four) hours as needed for nausea or vomiting. 04/11/23  Yes Cirigliano, Vito V, DO  warfarin (COUMADIN) 5 MG tablet Take 1 tablet (5 mg total) by mouth daily at 4 PM. Patient taking differently: Take 5-10 mg by mouth See admin instructions. Take 10 mg by mouth at 6 PM on Sun/Tues/Thurs/Sat and 5 mg on Mon/Wed/Fri 07/26/19  Yes Shahmehdi, Seyed A, MD  enoxaparin (LOVENOX) 80 MG/0.8ML injection Inject 0.8 mLs (80 mg total) into the skin 2 (two) times daily for 7 days. Follow up for INR check 1/30 as scheduled Patient not taking: Reported on 04/22/2023 04/22/23 04/29/23  Azucena Fallen, MD  rosuvastatin (CRESTOR) 20 MG tablet Take 20 mg by mouth at bedtime. Patient not taking: Reported on 04/22/2023    [provider]     Vitals:   04/22/23 2058 04/22/23 2059  04/22/23 2100 04/22/23 2110  BP:   (!) 142/73 (!) 142/73  Pulse:    (!) 109  Resp: (!) 25 (!) 22 19 (!) 22  Temp:    98.1 F (36.7 C)  TempSrc:    Oral  SpO2:    100%  Weight:      Height:       Physical Exam Vitals and nursing note reviewed.  Constitutional:      General: He is not in acute distress. HENT:     Head: Normocephalic and atraumatic.     Right Ear: Hearing normal.     Left Ear: Hearing normal.     Nose: Nose normal. No nasal deformity.     Mouth/Throat:     Lips: Pink.     Tongue: No lesions.  Eyes:     General: Lids are normal.  Extraocular Movements: Extraocular movements intact.  Cardiovascular:     Rate and Rhythm: Normal rate and regular rhythm.     Heart sounds: Normal heart sounds.  Pulmonary:     Effort: Pulmonary effort is normal.     Breath sounds: Normal breath sounds.  Abdominal:     General: Bowel sounds are normal. There is no distension.     Palpations: Abdomen is soft. There is no mass.     Tenderness: There is no abdominal tenderness.  Musculoskeletal:     Right lower leg: No edema.     Left lower leg: No edema.  Skin:    General: Skin is warm.  Neurological:     General: No focal deficit present.     Mental Status: He is alert and oriented to person, place, and time.     Cranial Nerves: Cranial nerves 2-12 are intact.  Psychiatric:        Attention and Perception: Attention normal.        Mood and Affect: Mood normal.        Speech: Speech normal.        Behavior: Behavior normal. Behavior is cooperative.      Labs on Admission: I have personally reviewed following labs and imaging studies Results for orders placed or performed during the hospital encounter of 04/22/23 (from the past 24 hours)  CBG monitoring, ED     Status: Abnormal   Collection Time: 04/22/23  1:27 PM  Result Value Ref Range   Glucose-Capillary 154 (H) 70 - 99 mg/dL  Basic metabolic panel     Status: Abnormal   Collection Time: 04/22/23  3:12 PM  Result  Value Ref Range   Sodium 136 135 - 145 mmol/L   Potassium 4.0 3.5 - 5.1 mmol/L   Chloride 104 98 - 111 mmol/L   CO2 21 (L) 22 - 32 mmol/L   Glucose, Bld 186 (H) 70 - 99 mg/dL   BUN 10 8 - 23 mg/dL   Creatinine, Ser 1.61 (L) 0.61 - 1.24 mg/dL   Calcium 8.3 (L) 8.9 - 10.3 mg/dL   GFR, Estimated >09 >60 mL/min   Anion gap 11 5 - 15  CBC     Status: Abnormal   Collection Time: 04/22/23  3:12 PM  Result Value Ref Range   WBC 21.5 (H) 4.0 - 10.5 K/uL   RBC 3.26 (L) 4.22 - 5.81 MIL/uL   Hemoglobin 8.9 (L) 13.0 - 17.0 g/dL   HCT 45.4 (L) 09.8 - 11.9 %   MCV 88.0 80.0 - 100.0 fL   MCH 27.3 26.0 - 34.0 pg   MCHC 31.0 30.0 - 36.0 g/dL   RDW 14.7 82.9 - 56.2 %   Platelets 659 (H) 150 - 400 K/uL   nRBC 0.0 0.0 - 0.2 %  Protime-INR     Status: Abnormal   Collection Time: 04/22/23  3:13 PM  Result Value Ref Range   Prothrombin Time 17.4 (H) 11.4 - 15.2 seconds   INR 1.4 (H) 0.8 - 1.2  Troponin I (High Sensitivity)     Status: None   Collection Time: 04/22/23  4:49 PM  Result Value Ref Range   Troponin I (High Sensitivity) 5 <18 ng/L  Lipase, blood     Status: None   Collection Time: 04/22/23  4:49 PM  Result Value Ref Range   Lipase 49 11 - 51 U/L  I-Stat Lactic Acid     Status: None   Collection Time: 04/22/23  5:07 PM  Result Value Ref Range   Lactic Acid, Venous 1.8 0.5 - 1.9 mmol/L  Troponin I (High Sensitivity)     Status: None   Collection Time: 04/22/23  7:50 PM  Result Value Ref Range   Troponin I (High Sensitivity) 5 <18 ng/L  Hepatic function panel     Status: Abnormal   Collection Time: 04/22/23  8:43 PM  Result Value Ref Range   Total Protein 6.3 (L) 6.5 - 8.1 g/dL   Albumin 2.7 (L) 3.5 - 5.0 g/dL   AST 28 15 - 41 U/L   ALT 16 0 - 44 U/L   Alkaline Phosphatase 86 38 - 126 U/L   Total Bilirubin 1.1 0.0 - 1.2 mg/dL   Bilirubin, Direct 0.2 0.0 - 0.2 mg/dL   Indirect Bilirubin 0.9 0.3 - 0.9 mg/dL   No results found for this or any previous visit (from the past 720  hours). CBC:    Latest Ref Rng & Units 04/22/2023    3:12 PM 04/22/2023    4:06 AM 04/21/2023   11:05 AM  CBC  WBC 4.0 - 10.5 K/uL 21.5  17.1  16.0   Hemoglobin 13.0 - 17.0 g/dL 8.9  8.4  9.0   Hematocrit 39.0 - 52.0 % 28.7  26.2  28.6   Platelets 150 - 400 K/uL 659  515  770    Basic Metabolic Panel: Recent Labs  Lab 04/18/23 1653 04/21/23 1105 04/22/23 0406 04/22/23 1512  NA 139 141 134* 136  K 4.5 3.7 3.5 4.0  CL 103 107 103 104  CO2 29 22 24  21*  GLUCOSE 96 163* 138* 186*  BUN 13 13 9 10   CREATININE 0.94 0.69 0.58* 0.56*  CALCIUM 8.9 8.8* 8.0* 8.3*  MG 1.7  --   --   --    Creatinine: Lab Results  Component Value Date   CREATININE 0.56 (L) 04/22/2023   CREATININE 0.58 (L) 04/22/2023   CREATININE 0.69 04/21/2023   Liver Function Tests:    Latest Ref Rng & Units 04/22/2023    8:43 PM 04/22/2023    4:06 AM 04/21/2023   11:05 AM  Hepatic Function  Total Protein 6.5 - 8.1 g/dL 6.3  6.1  6.5   Albumin 3.5 - 5.0 g/dL 2.7  2.6  2.9   AST 15 - 41 U/L 28  24  34   ALT 0 - 44 U/L 16  16  18    Alk Phosphatase 38 - 126 U/L 86  86  89   Total Bilirubin 0.0 - 1.2 mg/dL 1.1  1.0  0.9   Bilirubin, Direct 0.0 - 0.2 mg/dL 0.2      Coagulation Profile: Recent Labs  Lab 04/21/23 1046 04/21/23 1800 04/22/23 0221 04/22/23 0814 04/22/23 1513  INR 7.8* 2.6* 1.6* 1.5* 1.4*   Cardiac Enzymes: No results for input(s): "CKTOTAL", "CKMB", "CKMBINDEX", "TROPONINI" in the last 168 hours. BNP (last 3 results) No results for input(s): "PROBNP" in the last 8760 hours. HbA1C: Recent Labs    04/21/23 1800  HGBA1C 6.2*   Lipid Profile: No results for input(s): "CHOL", "HDL", "LDLCALC", "TRIG", "CHOLHDL", "LDLDIRECT" in the last 72 hours.  Radiological Exams on Admission: MR BRAIN WO CONTRAST Result Date: 04/22/2023 CLINICAL DATA:  Mental status change, unknown cause EXAM: MRI HEAD WITHOUT CONTRAST TECHNIQUE: Multiplanar, multiecho pulse sequences of the brain and surrounding  structures were obtained without intravenous contrast. COMPARISON:  CT head from today. FINDINGS: Brain: No acute infarction, hemorrhage, hydrocephalus,  extra-axial collection or mass lesion. Patchy T2/FLAIR hyperintensities in the white matter are nonspecific but compatible with chronic microvascular ischemic change. Cerebral atrophy. Vascular: Major arterial flow voids are maintained at the skull base. Skull and upper cervical spine: Normal marrow signal. Sinuses/Orbits: Clear sinuses.  No acute orbital findings. Other: No mastoid effusions. IMPRESSION: No evidence of acute intracranial abnormality. Electronically Signed   By: Feliberto Harts M.D.   On: 04/22/2023 22:17   CT Angio Chest/Abd/Pel for Dissection W and/or W/WO Result Date: 04/22/2023 CLINICAL DATA:  Recent syncopal episode EXAM: CT ANGIOGRAPHY CHEST, ABDOMEN AND PELVIS TECHNIQUE: Non-contrast CT of the chest was initially obtained. Multidetector CT imaging through the chest, abdomen and pelvis was performed using the standard protocol during bolus administration of intravenous contrast. Multiplanar reconstructed images and MIPs were obtained and reviewed to evaluate the vascular anatomy. RADIATION DOSE REDUCTION: This exam was performed according to the departmental dose-optimization program which includes automated exposure control, adjustment of the mA and/or kV according to patient size and/or use of iterative reconstruction technique. CONTRAST:  OMNIPAQUE IOHEXOL 350 MG/ML SOLN COMPARISON:  04/21/2023 FINDINGS: CTA CHEST FINDINGS Cardiovascular: Initial precontrast images demonstrate heavy atherosclerotic calcifications of the aorta. No aneurysmal dilatation is noted. Coronary calcifications are seen. Post-contrast images demonstrate no evidence of aortic dissection or aneurysmal dilatation. Pulmonary artery as visualized is within normal limits without evidence of pulmonary embolism. No cardiac enlargement is seen. Stable small  pericardial effusion is noted. Mediastinum/Nodes: Thoracic inlet is within normal limits. No hilar or mediastinal adenopathy is noted. The esophagus as visualized is within normal limits. Lungs/Pleura: Lungs are well aerated bilaterally. Mild left basilar atelectasis is noted. No sizable effusion is seen. Stable 3 mm nodule is noted in the right upper lobe. Musculoskeletal: No acute rib abnormality is noted. Degenerative changes of the thoracic spine are seen. Review of the MIP images confirms the above findings. CTA ABDOMEN AND PELVIS FINDINGS VASCULAR Aorta: Atherosclerotic calcifications are noted without aneurysmal dilatation or dissection. Celiac: Patent without evidence of aneurysm, dissection, vasculitis or significant stenosis. SMA: Patent without evidence of aneurysm, dissection, vasculitis or significant stenosis. Renals: Both renal arteries are patent without evidence of aneurysm, dissection, vasculitis, fibromuscular dysplasia or significant stenosis. IMA: Patent without evidence of aneurysm, dissection, vasculitis or significant stenosis. Inflow: Iliacs demonstrate diffuse atherosclerotic calcification. Focal minimal dissection is noted in the right common iliac artery. No flow limitation is noted. No other arterial abnormality is seen. Veins: No obvious venous abnormality within the limitations of this arterial phase study. Review of the MIP images confirms the above findings. NON-VASCULAR Hepatobiliary: No focal liver abnormality is seen. No gallstones, gallbladder wall thickening, or biliary dilatation. Pancreas: Pancreas again demonstrates inflammatory change consistent with pancreatitis. Dilatation of the pancreatic duct is noted. The overall appearance is stable from the previous exam. Spleen: No splenic injury or perisplenic hematoma. Adrenals/Urinary Tract: Adrenal glands are within normal limits. Kidneys demonstrate a normal enhancement pattern bilaterally. No obstructive changes are seen. The  bladder is partially distended with opacified urine. Stomach/Bowel: Scattered diverticular change of the colon is noted. No diverticulitis is seen. The appendix is well visualized and within normal limits. Small bowel and stomach are within normal limits. Lymphatic: No sizable adenopathy is seen. Reproductive: Prostate is unremarkable. Other: No abdominal wall hernia or abnormality. No abdominopelvic ascites. Musculoskeletal: No acute or significant osseous findings. Review of the MIP images confirms the above findings. IMPRESSION: CTA of the chest: No evidence of pulmonary emboli. No evidence of aortic dissection. Stable 3  mm nodule in the right upper lobe. No follow-up is recommended. CTA of the abdomen and pelvis: No acute arterial abnormality is seen. Stable changes of acute pancreatitis. 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 Diverticulosis without diverticulitis. Electronically Signed   By: Alcide Clever M.D.   On: 04/22/2023 21:00   DG Chest Portable 1 View Result Date: 04/22/2023 CLINICAL DATA:  Syncope. EXAM: PORTABLE CHEST 1 VIEW COMPARISON:  Radiograph 04/18/2023 FINDINGS: Stable cardiomediastinal silhouette. Aortic atherosclerotic calcification. No focal consolidation, pleural effusion, or pneumothorax. No displaced rib fractures. IMPRESSION: No acute cardiopulmonary disease. Electronically Signed   By: Minerva Fester M.D.   On: 04/22/2023 17:15   CT Head Wo Contrast Result Date: 04/22/2023 CLINICAL DATA:  Mental status change of unknown cause. Syncopal episode with trauma to the head. EXAM: CT HEAD WITHOUT CONTRAST TECHNIQUE: Contiguous axial images were obtained from the base of the skull through the vertex without intravenous contrast. RADIATION DOSE REDUCTION: This exam was performed according to the departmental dose-optimization program which includes automated exposure control, adjustment of the mA and/or kV according to patient size and/or use of  iterative reconstruction technique. COMPARISON:  05/09/2019 FINDINGS: Brain: Age related volume loss. Chronic small-vessel ischemic changes of the white matter. No sign of acute infarction, mass lesion, hemorrhage, hydrocephalus or extra-axial collection. Vascular: There is atherosclerotic calcification of the major vessels at the base of the brain. Skull: No skull fracture. Sinuses/Orbits: Clear/normal Other: None IMPRESSION: No acute CT finding. Age related volume loss. Chronic small-vessel ischemic changes of the white matter. Electronically Signed   By: Paulina Fusi M.D.   On: 04/22/2023 16:34   CT CHEST ABDOMEN PELVIS W CONTRAST Result Date: 04/21/2023 CLINICAL DATA:  Left chest wall bruise. Rule out bleeding or traumatic injury to the chest. Shortness of breath. EXAM: CT CHEST, ABDOMEN, AND PELVIS WITH CONTRAST TECHNIQUE: Multidetector CT imaging of the chest, abdomen and pelvis was performed following the standard protocol during bolus administration of intravenous contrast. RADIATION DOSE REDUCTION: This exam was performed according to the departmental dose-optimization program which includes automated exposure control, adjustment of the mA and/or kV according to patient size and/or use of iterative reconstruction technique. CONTRAST:  OMNIPAQUE IOHEXOL 300 MG/ML  SOLN COMPARISON:  CT of the abdomen pelvis dated 04/16/2023. FINDINGS: CT CHEST FINDINGS Cardiovascular: There is no cardiomegaly. Small pericardial effusion measuring up to 8 mm in thickness anterior to the heart. There is 3 vessel coronary vascular calcification. Moderate atherosclerotic calcification of the thoracic aorta. No aneurysmal dilatation or dissection. No periaortic fluid collection. The central pulmonary arteries appear patent. Mediastinum/Nodes: No hilar or mediastinal adenopathy. The esophagus is grossly unremarkable. No mediastinal fluid collection. Lungs/Pleura: Linear atelectasis/scarring in the lingula. A 3 mm right  apical nodule (29/6). No focal consolidation, pleural effusion, or pneumothorax. The central airways are patent. Musculoskeletal: Degenerative changes of the spine and osteopenia. No acute osseous pathology. There is stranding of the fat plane of the left axilla and shoulder area. No large fluid collection or hematoma. Mild asymmetric enlargement of the left shoulder musculature involving the medial aspect of the subscapularis suspicious for a small intramuscular hematoma. CT ABDOMEN PELVIS FINDINGS No intra-abdominal free air or free fluid. Hepatobiliary: The liver is unremarkable. No biliary dilatation. The gallbladder is unremarkable. Pancreas: Inflammatory changes of the pancreas in keeping with no pancreatitis. There is dilatation of the main pancreatic duct as on the prior CT. No drainable fluid collection/abscess or pseudocyst. Spleen: Normal in size without focal abnormality.  The uterus Adrenals/Urinary Tract: Glands are unremarkable. There is no hydronephrosis on either side. There is symmetric enhancement and excretion of contrast by both kidneys. The visualized ureters and urinary bladder appear unremarkable. Stomach/Bowel: There is sigmoid diverticulosis without active inflammatory changes. There is no bowel obstruction or active inflammation. The appendix is normal. Vascular/Lymphatic: Moderate aortoiliac atherosclerotic disease. The IVC is unremarkable. No portal venous gas. There is no adenopathy. Reproductive: Mildly enlarged prostate gland measuring 6 cm in transverse axial diameter. The seminal vesicles are symmetric. Other: None Musculoskeletal: Degenerative changes of the hips bilaterally, left greater than right. Osteopenia. Similar appearance of a sclerotic area in the superior aspect of L3, likely a Schmorl's node. No acute osseous pathology. IMPRESSION: 1. Probable small intramuscular hematoma involving the medial left subscapularis muscle. No fluid collection or large hematoma. 2. Acute  pancreatitis. No drainable fluid collection/abscess or pseudocyst. 3. Sigmoid diverticulosis. No bowel obstruction. Normal appendix. 4.  Aortic Atherosclerosis (ICD10-I70.0). Electronically Signed   By: Elgie Collard M.D.   On: 04/21/2023 12:39    Data Reviewed: Relevant notes from primary care and specialist visits, past discharge summaries as available in EHR, including Care Everywhere. Prior diagnostic testing as pertinent to current admission diagnoses, Updated medications and problem lists for reconciliation ED course, including vitals, labs, imaging, treatment and response to treatment,Triage notes, nursing and pharmacy notes and ED provider's notes Notable results as noted in HPI.Discussed case with EDMD/ ED APP/ or Specialty MD on call and as needed.  Assessment & Plan Syncope and collapse Suspect secondary to sepsis from intra-abdominal source.  Differentials include cholecystitis, patient has had an abnormal MRCP that shows thickened gallbladder, is currently being referred to [facility].  Suspect patient will need surgical intervention.  Will monitor and defer to a.m. team for GEN surge consult. Acute pancreatitis Mild, CT abdomen shows acute on chronic pancreatitis suspected may be just starting. Lipid panel pending. Abnormal magnetic resonance cholangiopancreatography (MRCP) Patient is being referred to [facility] for evaluation of stricture or mass.  See report below: IMPRESSION: 1. Dilatation of the pancreatic duct measuring up to 0.7 cm, with abrupt caliber change in the pancreatic head neck junction. Diffusely atrophic pancreatic parenchyma distal to this point. Generally normal appearance of the pancreatic head. This appearance has evolved over prior examinations dating back to 07/24/2019 and most likely reflects chronic sequelae of pancreatitis, without discretely visible mass. However, given motion limitations on today's examination consider ERCP to further  evaluate for ductal stricture or occult mass. 2. 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 This requires no specific further follow-up given small size and advanced patient age. 3. No acute inflammatory findings in the abdomen at this time.   Aortic Atherosclerosis (ICD10-I70.0).   Leucocytosis Suspect secondary to biliary etiology. Will cover with BS iv abx zosyn.   Benign essential hypertension Vitals:   04/22/23 1332 04/22/23 1746 04/22/23 1800 04/22/23 1830  BP: 101/88 (!) 152/89 (!) 142/81 137/73   04/22/23 1900 04/22/23 1915 04/22/23 1930 04/22/23 2000  BP: (!) 167/75 (!) 167/75 (!) 151/72 (!) 164/87   04/22/23 2100 04/22/23 2110  BP: (!) 142/73 (!) 142/73  Patient continued on metoprolol, losartan, amlodipine and hydralazine.  Paroxysmal atrial fibrillation (HCC) Currently in sinus rhythm. Patient continued on Coumadin per pharmacy protocol. Anemia    Latest Ref Rng & Units 04/22/2023    3:12 PM 04/22/2023    4:06 AM 04/21/2023  11:05 AM  CBC  WBC 4.0 - 10.5 K/uL 21.5  17.1  16.0   Hemoglobin 13.0 - 17.0 g/dL 8.9  8.4  9.0   Hematocrit 39.0 - 52.0 % 28.7  26.2  28.6   Platelets 150 - 400 K/uL 659  515  770   Patient is hemoglobin has been trending down from 11.9-8.9 today CT angio chest abdomen and pelvis is negative.  Chronic blood loss through the gut. -Type and screen, IV PPI, stool guaiac and follow.  Elevated INR (Resolved: 04/22/2023) INR/Prothrombin Time subtherapeutic will consult pharmacy for Coumadin management for paroxysmal atrial fibrillation.  Sliding scale insulin regimen. \ DVT prophylaxis:  Coumadin  Consults:  None  Advance Care Planning:    Code Status: Prior   Family Communication:  None  Disposition Plan:  TBD  Severity of Illness: The appropriate patient status for this patient is INPATIENT. Inpatient status is judged to be reasonable and  necessary in order to provide the required intensity of service to ensure the patient's safety. The patient's presenting symptoms, physical exam findings, and initial radiographic and laboratory data in the context of their chronic comorbidities is felt to place them at high risk for further clinical deterioration. Furthermore, it is not anticipated that the patient will be medically stable for discharge from the hospital within 2 midnights of admission.   * I certify that at the point of admission it is my clinical judgment that the patient will require inpatient hospital care spanning beyond 2 midnights from the point of admission due to high intensity of service, high risk for further deterioration and high frequency of surveillance required.*  Author: Gertha Calkin, MD 04/22/2023 11:02 PM  For on call review [email or web address].   Unresulted Labs (From admission, onward)     Start     Ordered   04/23/23 0500  Comprehensive metabolic panel  Tomorrow morning,   R        04/22/23 2300   04/23/23 0500  Magnesium  Tomorrow morning,   R        04/22/23 2300   04/23/23 0500  Phosphorus  Tomorrow morning,   R        04/22/23 2300   04/23/23 0500  CBC with Differential/Platelet  Tomorrow morning,   R        04/22/23 2300   04/22/23 2300  Type and screen  Once,   STAT        04/22/23 2300   04/22/23 2244  Lipid panel  Add-on,   AD        04/22/23 2243   04/22/23 2235  Respiratory (~20 pathogens) panel by PCR  (Respiratory panel by PCR (~20 pathogens, ~24 hr TAT)  w precautions)  Once,   URGENT        04/22/23 2239   04/22/23 2047  Culture, blood (Routine X 2) w Reflex to ID Panel  BLOOD CULTURE X 2,   R      04/22/23 2046   04/22/23 2026  Gamma GT  Add-on,   AD        04/22/23 2025   04/22/23 2026  Ethanol  Add-on,   AD        04/22/23 2025   04/22/23 1327  Urinalysis, Routine w reflex microscopic -Urine, Clean Catch  Once,   URGENT       Question Answer Comment  Specimen Source Urine, Clean  Catch   Release to patient Immediate      04/22/23 1326  Orders Placed This Encounter  Procedures   Culture, blood (Routine X 2) w Reflex to ID Panel   Respiratory (~20 pathogens) panel by PCR   CT Head Wo Contrast   DG Chest Portable 1 View   MR BRAIN WO CONTRAST   CT Angio Chest/Abd/Pel for Dissection W and/or W/WO   US Abdomen Limited RUQ (LIVER/GB)   Basic metabolic panel   CBC   Urinalysis, Routine w reflex microscopic -Urine, Clean Catch   Protime-INR   Lipase, blood   Gamma GT   Ethanol   Hepatic function panel   Lipid panel   Comprehensive metabolic panel   Magnesium   Phosphorus   CBC with Differential/Platelet   Diet NPO time specified   ED Cardiac monitoring   Fluid/PO Challenge   Patient may eat/drink   Apply Diabetes Mellitus Care Plan   STAT CBG when hypoglycemia is suspected. If treated, recheck every 15 minutes after each treatment until CBG >/= 70 mg/dl   Refer to Hypoglycemia Protocol Sidebar Report for treatment of CBG < 70 mg/dl   No HS correction Insulin   Consult to hospitalist   warfarin (COUMADIN) per pharmacy consult   piperacillin-tazobactam (ZOSYN) per pharmacy consult   Droplet precaution   Respiratory protocol   CBG monitoring, ED   I-Stat Lactic Acid   ED EKG   EKG 12-Lead   Type and screen   Saline lock IV   Aspiration precautions   Scheduled Meds:  [START ON 04/23/2023] amLODipine  10 mg Oral Daily   [START ON 04/23/2023] losartan  25 mg Oral Daily   metoprolol tartrate  75 mg Oral BID   thiamine (VITAMIN B1) injection  100 mg Intravenous Daily   Continuous Infusions: PRN Meds:.insulin aspart

## 2023-04-22 NOTE — Assessment & Plan Note (Addendum)
Patient is being referred to [facility] for evaluation of stricture or mass.  See report below: IMPRESSION: 1. Dilatation of the pancreatic duct measuring up to 0.7 cm, with abrupt caliber change in the pancreatic head neck junction. Diffusely atrophic pancreatic parenchyma distal to this point. Generally normal appearance of the pancreatic head. This appearance has evolved over prior examinations dating back to 07/24/2019 and most likely reflects chronic sequelae of pancreatitis, without discretely visible mass. However, given motion limitations on today's examination consider ERCP to further evaluate for ductal stricture or occult mass. 2. 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 This requires no specific further follow-up given small size and advanced patient age. 3. No acute inflammatory findings in the abdomen at this time.   Aortic Atherosclerosis (ICD10-I70.0).

## 2023-04-22 NOTE — ED Triage Notes (Signed)
BIBA c/o syncopal episode. Denies hitting head was assisted to floor. Pt is on coumadin.  Pt reports being discharged from hospital and was getting gas and had syncopal episode. [id number] Hematoma to left side from prior to fall.

## 2023-04-22 NOTE — Assessment & Plan Note (Addendum)
Suspect secondary to sepsis from intra-abdominal source.  Differentials include cholecystitis, patient has had an abnormal MRCP that shows thickened gallbladder, is currently being referred to [facility].  Suspect patient will need surgical intervention.  Will monitor and defer to a.m. team for GEN surge consult.

## 2023-04-22 NOTE — Discharge Summary (Signed)
Physician Discharge Summary  Christian Crawford BJY:[id number] DOB: 03/05/1934 DOA: 04/21/2023  PCP: Ellyn Hack, MD  Admit date: 04/21/2023 Discharge date: 04/22/2023  Admitted From: Home Disposition: Home  Recommendations for Outpatient Follow-up:  Follow up with PCP in 1-2 weeks Follow-up with PCP on 1/30 for INR recheck  Home Health: None Equipment/Devices: None  Discharge Condition: Stable CODE STATUS: Full Diet recommendation: Low-salt low-fat diet, avoid leafy greens  Brief/Interim Summary: Christian Crawford is a 89 y.o. male with medical history significant of atrial fibrillation on Coumadin, hypertension, prior TIA, diabetes, pancreatitis, GERD who presents to the hospital with new onset left chest wall hematoma and pain.   Patient admitted as above with profoundly elevated INR and spontaneous left chest wall hematoma.  Patient was given vitamin K with appropriate reversal, INR this morning somewhat low at 1.5, discharged with Lovenox and single dose of increased Coumadin.  Recommended to return back to usual Coumadin dosing and follow-up on 1/30 as previously scheduled for INR recheck.  Further adjustment per primary team.  Patient otherwise stable and agreeable for discharge -denies nausea vomiting diarrhea constipation headache fevers chills chest pain shortness of breath.    Discharge Diagnoses:  Principal Problem:   Elevated INR  Acute spontaneous hematoma in the setting of elevated INR  -INR 7.8 at intake, down to 1.5 prior to discharge, continue Lovenox, Coumadin as discussed   Leukocytosis, thrombocytosis, chronic anemia of chronic disease  -Elevated white count, platelet count and mild anemia noted over the last 3 months   Chronic pancreatitis without acute exacerbation -Lipase elevated to 77 but lower than previous labs earlier this month, appears to be chronically elevated -CT abdomen pelvis read as acute pancreatitis, however patient has no symptoms denies  nausea vomiting abdominal pain -tolerating p.o. quite well during interview   A-fib, chronic, rate controlled -Currently rate controlled, continue home metoprolol at lower dose 50 twice daily   History of TIA Hypertension, essential Not insulin-dependent diabetes type 2, well-controlled -Hypertension moderately well-controlled, resume home medications including amlodipine, losartan, metoprolol -Continue statin, hold aspirin given above -Continue diabetic diet, sliding scale insulin per protocol  Discharge Instructions  Discharge Instructions     Call MD for:  difficulty breathing, headache or visual disturbances   Complete by: As directed    Call MD for:  extreme fatigue   Complete by: As directed    Call MD for:  hives   Complete by: As directed    Call MD for:  persistant dizziness or light-headedness   Complete by: As directed    Call MD for:  persistant nausea and vomiting   Complete by: As directed    Call MD for:  redness, tenderness, or signs of infection (pain, swelling, redness, odor or green/yellow discharge around incision site)   Complete by: As directed    Call MD for:  severe uncontrolled pain   Complete by: As directed    Call MD for:  temperature >100.4   Complete by: As directed    Diet - low sodium heart healthy   Complete by: As directed    Increase activity slowly   Complete by: As directed       Allergies as of 04/22/2023       Reactions   Lisinopril Cough        Medication List     STOP taking these medications    acetaminophen 650 MG CR tablet Commonly known as: TYLENOL       TAKE these medications  albuterol 108 (90 Base) MCG/ACT inhaler Commonly known as: VENTOLIN HFA Inhale 1-2 puffs into the lungs every 6 (six) hours as needed for wheezing or shortness of breath.   amLODipine 10 MG tablet Commonly known as: NORVASC Take 10 mg by mouth daily.   Botox 100 units Solr injection Generic drug: botulinum toxin Type A Inject  100 Units into the muscle See admin instructions. Inject 100 units into the muscle every 4 months   enoxaparin 80 MG/0.8ML injection Commonly known as: LOVENOX Inject 0.8 mLs (80 mg total) into the skin 2 (two) times daily for 7 days. Follow up for INR check 1/30 as scheduled   latanoprost 0.005 % ophthalmic solution Commonly known as: XALATAN Place 1 drop into both eyes at bedtime.   losartan 25 MG tablet Commonly known as: COZAAR Take 25 mg by mouth daily.   metFORMIN 500 MG 24 hr tablet Commonly known as: GLUCOPHAGE-XR Take 500 mg by mouth in the morning and at bedtime.   metoprolol tartrate 50 MG tablet Commonly known as: LOPRESSOR Take 75 mg by mouth in the morning and at bedtime.   ondansetron 4 MG tablet Commonly known as: ZOFRAN Take 1 tablet (4 mg total) by mouth every 4 (four) hours as needed for nausea or vomiting.   rosuvastatin 20 MG tablet Commonly known as: CRESTOR Take 20 mg by mouth at bedtime.   sucralfate 1 g tablet Commonly known as: Carafate Take 1 tablet (1 g total) by mouth with breakfast, with lunch, and with evening meal for 7 days.   warfarin 5 MG tablet Commonly known as: COUMADIN Take 1 tablet (5 mg total) by mouth daily at 4 PM. What changed:  how much to take when to take this additional instructions        Allergies  Allergen Reactions   Lisinopril Cough    Consultations: None  Procedures/Studies: CT CHEST ABDOMEN PELVIS W CONTRAST Result Date: 04/21/2023 CLINICAL DATA:  Left chest wall bruise. Rule out bleeding or traumatic injury to the chest. Shortness of breath. EXAM: CT CHEST, ABDOMEN, AND PELVIS WITH CONTRAST TECHNIQUE: Multidetector CT imaging of the chest, abdomen and pelvis was performed following the standard protocol during bolus administration of intravenous contrast. RADIATION DOSE REDUCTION: This exam was performed according to the departmental dose-optimization program which includes automated exposure control,  adjustment of the mA and/or kV according to patient size and/or use of iterative reconstruction technique. CONTRAST:  OMNIPAQUE IOHEXOL 300 MG/ML  SOLN COMPARISON:  CT of the abdomen pelvis dated 04/16/2023. FINDINGS: CT CHEST FINDINGS Cardiovascular: There is no cardiomegaly. Small pericardial effusion measuring up to 8 mm in thickness anterior to the heart. There is 3 vessel coronary vascular calcification. Moderate atherosclerotic calcification of the thoracic aorta. No aneurysmal dilatation or dissection. No periaortic fluid collection. The central pulmonary arteries appear patent. Mediastinum/Nodes: No hilar or mediastinal adenopathy. The esophagus is grossly unremarkable. No mediastinal fluid collection. Lungs/Pleura: Linear atelectasis/scarring in the lingula. A 3 mm right apical nodule (29/6). No focal consolidation, pleural effusion, or pneumothorax. The central airways are patent. Musculoskeletal: Degenerative changes of the spine and osteopenia. No acute osseous pathology. There is stranding of the fat plane of the left axilla and shoulder area. No large fluid collection or hematoma. Mild asymmetric enlargement of the left shoulder musculature involving the medial aspect of the subscapularis suspicious for a small intramuscular hematoma. CT ABDOMEN PELVIS FINDINGS No intra-abdominal free air or free fluid. Hepatobiliary: The liver is unremarkable. No biliary dilatation. The gallbladder is  unremarkable. Pancreas: Inflammatory changes of the pancreas in keeping with no pancreatitis. There is dilatation of the main pancreatic duct as on the prior CT. No drainable fluid collection/abscess or pseudocyst. Spleen: Normal in size without focal abnormality.  The uterus Adrenals/Urinary Tract: Glands are unremarkable. There is no hydronephrosis on either side. There is symmetric enhancement and excretion of contrast by both kidneys. The visualized ureters and urinary bladder appear unremarkable.  Stomach/Bowel: There is sigmoid diverticulosis without active inflammatory changes. There is no bowel obstruction or active inflammation. The appendix is normal. Vascular/Lymphatic: Moderate aortoiliac atherosclerotic disease. The IVC is unremarkable. No portal venous gas. There is no adenopathy. Reproductive: Mildly enlarged prostate gland measuring 6 cm in transverse axial diameter. The seminal vesicles are symmetric. Other: None Musculoskeletal: Degenerative changes of the hips bilaterally, left greater than right. Osteopenia. Similar appearance of a sclerotic area in the superior aspect of L3, likely a Schmorl's node. No acute osseous pathology. IMPRESSION: 1. Probable small intramuscular hematoma involving the medial left subscapularis muscle. No fluid collection or large hematoma. 2. Acute pancreatitis. No drainable fluid collection/abscess or pseudocyst. 3. Sigmoid diverticulosis. No bowel obstruction. Normal appendix. 4.  Aortic Atherosclerosis (ICD10-I70.0). Electronically Signed   By: Elgie Collard M.D.   On: 04/21/2023 12:39   DG Chest 2 View Result Date: 04/18/2023 CLINICAL DATA:  Shortness of breath. EXAM: CHEST - 2 VIEW COMPARISON:  11/21/2021. FINDINGS: Bilateral lung fields are clear. Bilateral costophrenic angles are clear. Normal cardio-mediastinal silhouette. No acute osseous abnormalities. The soft tissues are within normal limits. IMPRESSION: No active cardiopulmonary disease. Electronically Signed   By: Jules Schick M.D.   On: 04/18/2023 16:20   CT ABDOMEN PELVIS W CONTRAST Result Date: 04/17/2023 CLINICAL DATA:  Epigastric abdominal pain. Nausea and vomiting. Acute pancreatitis. EXAM: CT ABDOMEN AND PELVIS WITH CONTRAST TECHNIQUE: Multidetector CT imaging of the abdomen and pelvis was performed using the standard protocol following bolus administration of intravenous contrast. RADIATION DOSE REDUCTION: This exam was performed according to the departmental dose-optimization program  which includes automated exposure control, adjustment of the mA and/or kV according to patient size and/or use of iterative reconstruction technique. CONTRAST:  OMNIPAQUE IOHEXOL 300 MG/ML  SOLN COMPARISON:  01/21/2023 FINDINGS: Lower Chest: No acute findings. Hepatobiliary: No suspicious hepatic masses identified. Gallbladder is unremarkable. No evidence of biliary ductal dilatation. Pancreas: Moderate acute pancreatitis is seen. A small low-attenuation focus in the tail measuring 1.4 cm may represent pancreatic necrosis or pseudocyst. Spleen: Within normal limits in size and appearance. Adrenals/Urinary Tract: No suspicious masses identified. No evidence of ureteral calculi or hydronephrosis. Stomach/Bowel: No evidence of obstruction, inflammatory process or abnormal fluid collections. Normal appendix visualized. Diverticulosis is seen mainly involving the sigmoid colon, however there is no evidence of diverticulitis. Vascular/Lymphatic: No pathologically enlarged lymph nodes. No acute vascular findings. Reproductive:  Mildly enlarged prostate. Other:  None. Musculoskeletal:  No suspicious bone lesions identified. IMPRESSION: Moderate acute pancreatitis. 1.4 cm low-attenuation focus in pancreatic tail, which may represent pancreatic necrosis or small pseudocyst. Colonic diverticulosis, without radiographic evidence of diverticulitis. Mildly enlarged prostate. Electronically Signed   By: Danae Orleans M.D.   On: 04/17/2023 14:59     Subjective: No acute issues or events overnight   Discharge Exam: Vitals:   04/22/23 1027 04/22/23 1100  BP:  (!) 141/87  Pulse:  (!) 108  Resp:  (!) 23  Temp: 98.2 F (36.8 C)   SpO2:  100%   Vitals:   04/22/23 0800 04/22/23 0923 04/22/23 1027 04/22/23  1100  BP: 138/83 (!) 150/95  (!) 141/87  Pulse: (!) 104 (!) 115  (!) 108  Resp: 18   (!) 23  Temp:   98.2 F (36.8 C)   TempSrc:   Axillary   SpO2: 98%   100%  Weight:      Height:        General: Pt is  alert, awake, not in acute distress Cardiovascular: RRR, S1/S2 +, no rubs, no gallops, left chest wall ecchymosis Respiratory: CTA bilaterally, no wheezing, no rhonchi Abdominal: Soft, NT, ND, bowel sounds + Extremities: no edema, no cyanosis    The results of significant diagnostics from this hospitalization (including imaging, microbiology, ancillary and laboratory) are listed below for reference.     Microbiology: No results found for this or any previous visit (from the past 240 hours).   Labs: BNP (last 3 results) No results for input(s): "BNP" in the last 8760 hours. Basic Metabolic Panel: Recent Labs  Lab 04/18/23 1653 04/21/23 1105 04/22/23 0406  NA 139 141 134*  K 4.5 3.7 3.5  CL 103 107 103  CO2 29 22 24   GLUCOSE 96 163* 138*  BUN 13 13 9   CREATININE 0.94 0.69 0.58*  CALCIUM 8.9 8.8* 8.0*  MG 1.7  --   --    Liver Function Tests: Recent Labs  Lab 04/18/23 1653 04/21/23 1105 04/22/23 0406  AST 35 34 24  ALT 14 18 16   ALKPHOS 99 89 86  BILITOT 0.4 0.9 1.0  PROT 6.1* 6.5 6.1*  ALBUMIN 3.3* 2.9* 2.6*   Recent Labs  Lab 04/18/23 1653 04/21/23 1105  LIPASE 81* 77*   Recent Labs  Lab 04/21/23 1153  AMMONIA 13   CBC: Recent Labs  Lab 04/18/23 1653 04/21/23 1105 04/22/23 0406  WBC 15.2* 16.0* 17.1*  NEUTROABS 11.7* 10.7*  --   HGB 10.1* 9.0* 8.4*  HCT 31.6* 28.6* 26.2*  MCV 85.6 87.5 85.6  PLT 465* 770* 515*   CBG: Recent Labs  Lab 04/21/23 1752 04/21/23 2216 04/22/23 0803  GLUCAP 139* 139* 129*   Hgb A1c Recent Labs    04/21/23 1800  HGBA1C 6.2*   Urinalysis    Component Value Date/Time   COLORURINE YELLOW 01/21/2023 1054   APPEARANCEUR CLEAR 01/21/2023 1054   LABSPEC 1.023 01/21/2023 1054   PHURINE 7.0 01/21/2023 1054   GLUCOSEU NEGATIVE 01/21/2023 1054   HGBUR NEGATIVE 01/21/2023 1054   BILIRUBINUR NEGATIVE 01/21/2023 1054   KETONESUR 5 (A) 01/21/2023 1054   PROTEINUR NEGATIVE 01/21/2023 1054   NITRITE NEGATIVE  01/21/2023 1054   LEUKOCYTESUR NEGATIVE 01/21/2023 1054   Sepsis Labs Recent Labs  Lab 04/18/23 1653 04/21/23 1105 04/22/23 0406  WBC 15.2* 16.0* 17.1*   Microbiology No results found for this or any previous visit (from the past 240 hours).   Time coordinating discharge: Over 30 minutes  SIGNED:   Azucena Fallen, DO [facility] 04/22/2023, 11:29 AM Pager   If 7PM-7AM, please contact night-coverage [email or web address]

## 2023-04-22 NOTE — Progress Notes (Signed)
PHARMACY - ANTICOAGULATION CONSULT NOTE  Pharmacy Consult for Warfarin Indication: atrial fibrillation  Allergies  Allergen Reactions   Lisinopril Cough    Patient Measurements: Height: 6\' 1"  (185.4 cm) Weight: 79 kg (174 lb 2.6 oz) IBW/kg (Calculated) : 79.9    Vital Signs: Temp: 98.1 F (36.7 C) (01/28 2110) Temp Source: Oral (01/28 2110) BP: 142/73 (01/28 2110) Pulse Rate: 109 (01/28 2110)  Labs: Recent Labs     0000 04/21/23 1046 04/21/23 1105 04/21/23 1800 04/22/23 0221 04/22/23 0406 04/22/23 0814 04/22/23 1512 04/22/23 1513 04/22/23 1649 04/22/23 1950  HGB   < >  --  9.0*  --   --  8.4*  --  8.9*  --   --   --   HCT  --   --  28.6*  --   --  26.2*  --  28.7*  --   --   --   PLT  --   --  770*  --   --  515*  --  659*  --   --   --   LABPROT  --  65.8*  --    < > 19.5*  --  18.0*  --  17.4*  --   --   INR  --  7.8*  --    < > 1.6*  --  1.5*  --  1.4*  --   --   CREATININE  --   --  0.69  --   --  0.58*  --  0.56*  --   --   --   TROPONINIHS  --  7  --   --   --   --   --   --   --  5 5   < > = values in this interval not displayed.    Estimated Creatinine Clearance: 72.7 mL/min (A) (by C-G formula based on SCr of 0.56 mg/dL (L)).   Medical History: Past Medical History:  Diagnosis Date   A-fib (HCC)    Acute alcoholic pancreatitis 07/24/2019   Diverticulitis 11/13/2021   Heavy alcohol use 07/24/2019   Hematuria 11/14/2021   Hypertension    TIA (transient ischemic attack)     Medications:  PTA Warfarin regimen: 10mg  daily except 5mg  MWF  Assessment: 89 yr male discharged earlier today for hospitalization due to supratherapeutic INR and hematoma and readmitted with sepsis with suspected biliary source.  Patient did not received warfarin dose earlier today  Goal of Therapy:  INR 2-3 Monitor platelets by anticoagulation protocol: Yes   Plan:  Warfarin 10mg  po x 1 tonight Daily PT/INR  Shelly Shoultz, Joselyn Glassman, PharmD 04/22/2023,11:21  PM

## 2023-04-22 NOTE — Assessment & Plan Note (Addendum)
Mild, CT abdomen shows acute on chronic pancreatitis suspected may be just starting. Lipid panel pending.

## 2023-04-22 NOTE — Assessment & Plan Note (Addendum)
INR/Prothrombin Time subtherapeutic will consult pharmacy for Coumadin management for paroxysmal atrial fibrillation.

## 2023-04-22 NOTE — Assessment & Plan Note (Addendum)
    Latest Ref Rng & Units 04/22/2023    3:12 PM 04/22/2023    4:06 AM 04/21/2023   11:05 AM  CBC  WBC 4.0 - 10.5 K/uL 21.5  17.1  16.0   Hemoglobin 13.0 - 17.0 g/dL 8.9  8.4  9.0   Hematocrit 39.0 - 52.0 % 28.7  26.2  28.6   Platelets 150 - 400 K/uL 659  515  770   Patient is hemoglobin has been trending down from 11.9-8.9 today CT angio chest abdomen and pelvis is negative.  Chronic blood loss through the gut. -Type and screen, IV PPI, stool guaiac and follow.

## 2023-04-22 NOTE — Progress Notes (Signed)
PHARMACY - ANTICOAGULATION CONSULT NOTE  Pharmacy Consult for Coumadin Indication: atrial fibrillation  Allergies  Allergen Reactions   Lisinopril Cough    Patient Measurements: Height: 6\' 1"  (185.4 cm) Weight: 79.4 kg (175 lb) IBW/kg (Calculated) : 79.9  Vital Signs: Temp: 98.2 F (36.8 C) (01/28 1027) Temp Source: Axillary (01/28 1027) BP: 150/95 (01/28 0923) Pulse Rate: 115 (01/28 0923)  Labs: Recent Labs    04/21/23 1046 04/21/23 1105 04/21/23 1800 04/22/23 0221 04/22/23 0406 04/22/23 0814  HGB  --  9.0*  --   --  8.4*  --   HCT  --  28.6*  --   --  26.2*  --   PLT  --  770*  --   --  515*  --   LABPROT 65.8*  --  27.9* 19.5*  --  18.0*  INR 7.8*  --  2.6* 1.6*  --  1.5*  CREATININE  --  0.69  --   --  0.58*  --   TROPONINIHS 7  --   --   --   --   --     Estimated Creatinine Clearance: 73.1 mL/min (A) (by C-G formula based on SCr of 0.58 mg/dL (L)).   Medical History: Past Medical History:  Diagnosis Date   A-fib [facility])    Hypertension    TIA (transient ischemic attack)     Assessment  AC/Heme: PTA Warfarin for afib/TIA held on admission for supratherapeutic INR. (On 10mg  TTSS, 5mg  MWF) 1/27:  INR is 7.8,  Vitamin K 5mg  IV x 1 given in ED., Hgb has dropped ~3 in the last ten days and he has a large hematoma on his L side. MD said no other signs of bleeding but wanted to reverse. MD wanted more aggressive dosing due to drop in Hgb - 1/28: INR 1.5, Hgb down to 8.4  Goal of Therapy:  INR 2-3 Monitor platelets by anticoagulation protocol: Yes   Plan:  Coumadin 10mg  po x 1 today to overcome Vit K Daily INR    Denise Washburn S. Merilynn Finland, PharmD, BCPS Clinical Staff Pharmacist Misty Stanley Stillinger 04/22/2023,11:09 AM

## 2023-04-23 DIAGNOSIS — R569 Unspecified convulsions: Secondary | ICD-10-CM | POA: Diagnosis not present

## 2023-04-23 DIAGNOSIS — S20212A Contusion of left front wall of thorax, initial encounter: Secondary | ICD-10-CM | POA: Diagnosis present

## 2023-04-23 DIAGNOSIS — D75839 Thrombocytosis, unspecified: Secondary | ICD-10-CM | POA: Diagnosis present

## 2023-04-23 DIAGNOSIS — E43 Unspecified severe protein-calorie malnutrition: Secondary | ICD-10-CM | POA: Diagnosis present

## 2023-04-23 DIAGNOSIS — Z800 Family history of malignant neoplasm of digestive organs: Secondary | ICD-10-CM | POA: Diagnosis not present

## 2023-04-23 DIAGNOSIS — Z79899 Other long term (current) drug therapy: Secondary | ICD-10-CM | POA: Diagnosis not present

## 2023-04-23 DIAGNOSIS — D6832 Hemorrhagic disorder due to extrinsic circulating anticoagulants: Secondary | ICD-10-CM | POA: Diagnosis present

## 2023-04-23 DIAGNOSIS — K859 Acute pancreatitis without necrosis or infection, unspecified: Secondary | ICD-10-CM | POA: Diagnosis present

## 2023-04-23 DIAGNOSIS — K861 Other chronic pancreatitis: Secondary | ICD-10-CM

## 2023-04-23 DIAGNOSIS — R55 Syncope and collapse: Secondary | ICD-10-CM | POA: Diagnosis present

## 2023-04-23 DIAGNOSIS — D72829 Elevated white blood cell count, unspecified: Secondary | ICD-10-CM

## 2023-04-23 DIAGNOSIS — Z87891 Personal history of nicotine dependence: Secondary | ICD-10-CM | POA: Diagnosis not present

## 2023-04-23 DIAGNOSIS — K862 Cyst of pancreas: Secondary | ICD-10-CM | POA: Diagnosis present

## 2023-04-23 DIAGNOSIS — D696 Thrombocytopenia, unspecified: Secondary | ICD-10-CM

## 2023-04-23 DIAGNOSIS — E119 Type 2 diabetes mellitus without complications: Secondary | ICD-10-CM | POA: Diagnosis present

## 2023-04-23 DIAGNOSIS — I480 Paroxysmal atrial fibrillation: Secondary | ICD-10-CM | POA: Diagnosis present

## 2023-04-23 DIAGNOSIS — H409 Unspecified glaucoma: Secondary | ICD-10-CM | POA: Diagnosis present

## 2023-04-23 DIAGNOSIS — Z7984 Long term (current) use of oral hypoglycemic drugs: Secondary | ICD-10-CM | POA: Diagnosis not present

## 2023-04-23 DIAGNOSIS — I700 Atherosclerosis of aorta: Secondary | ICD-10-CM | POA: Diagnosis present

## 2023-04-23 DIAGNOSIS — R935 Abnormal findings on diagnostic imaging of other abdominal regions, including retroperitoneum: Secondary | ICD-10-CM

## 2023-04-23 DIAGNOSIS — D638 Anemia in other chronic diseases classified elsewhere: Secondary | ICD-10-CM | POA: Diagnosis present

## 2023-04-23 DIAGNOSIS — D649 Anemia, unspecified: Secondary | ICD-10-CM

## 2023-04-23 DIAGNOSIS — M7981 Nontraumatic hematoma of soft tissue: Secondary | ICD-10-CM | POA: Diagnosis present

## 2023-04-23 DIAGNOSIS — Z8673 Personal history of transient ischemic attack (TIA), and cerebral infarction without residual deficits: Secondary | ICD-10-CM | POA: Diagnosis not present

## 2023-04-23 DIAGNOSIS — Z7901 Long term (current) use of anticoagulants: Secondary | ICD-10-CM | POA: Diagnosis not present

## 2023-04-23 DIAGNOSIS — E876 Hypokalemia: Secondary | ICD-10-CM | POA: Diagnosis not present

## 2023-04-23 DIAGNOSIS — I482 Chronic atrial fibrillation, unspecified: Secondary | ICD-10-CM | POA: Diagnosis present

## 2023-04-23 DIAGNOSIS — Z7982 Long term (current) use of aspirin: Secondary | ICD-10-CM | POA: Diagnosis not present

## 2023-04-23 DIAGNOSIS — I10 Essential (primary) hypertension: Secondary | ICD-10-CM | POA: Diagnosis present

## 2023-04-23 DIAGNOSIS — I7772 Dissection of iliac artery: Secondary | ICD-10-CM | POA: Diagnosis present

## 2023-04-23 LAB — BLOOD CULTURE ID PANEL (REFLEXED) - BCID2
A.calcoaceticus-baumannii: NOT DETECTED
A.calcoaceticus-baumannii: NOT DETECTED
Bacteroides fragilis: NOT DETECTED
Bacteroides fragilis: NOT DETECTED
Candida albicans: NOT DETECTED
Candida albicans: NOT DETECTED
Candida auris: NOT DETECTED
Candida auris: NOT DETECTED
Candida glabrata: NOT DETECTED
Candida glabrata: NOT DETECTED
Candida krusei: NOT DETECTED
Candida krusei: NOT DETECTED
Candida parapsilosis: NOT DETECTED
Candida parapsilosis: NOT DETECTED
Candida tropicalis: NOT DETECTED
Candida tropicalis: NOT DETECTED
Cryptococcus neoformans/gattii: NOT DETECTED
Cryptococcus neoformans/gattii: NOT DETECTED
Enterobacter cloacae complex: NOT DETECTED
Enterobacter cloacae complex: NOT DETECTED
Enterobacterales: NOT DETECTED
Enterobacterales: NOT DETECTED
Enterococcus Faecium: NOT DETECTED
Enterococcus Faecium: NOT DETECTED
Enterococcus faecalis: NOT DETECTED
Enterococcus faecalis: NOT DETECTED
Escherichia coli: NOT DETECTED
Escherichia coli: NOT DETECTED
Haemophilus influenzae: NOT DETECTED
Haemophilus influenzae: NOT DETECTED
Klebsiella aerogenes: NOT DETECTED
Klebsiella aerogenes: NOT DETECTED
Klebsiella oxytoca: NOT DETECTED
Klebsiella oxytoca: NOT DETECTED
Klebsiella pneumoniae: NOT DETECTED
Klebsiella pneumoniae: NOT DETECTED
Listeria monocytogenes: NOT DETECTED
Listeria monocytogenes: NOT DETECTED
Neisseria meningitidis: NOT DETECTED
Neisseria meningitidis: NOT DETECTED
Proteus species: NOT DETECTED
Proteus species: NOT DETECTED
Pseudomonas aeruginosa: NOT DETECTED
Pseudomonas aeruginosa: NOT DETECTED
Salmonella species: NOT DETECTED
Salmonella species: NOT DETECTED
Serratia marcescens: NOT DETECTED
Serratia marcescens: NOT DETECTED
Staphylococcus aureus (BCID): NOT DETECTED
Staphylococcus aureus (BCID): NOT DETECTED
Staphylococcus epidermidis: NOT DETECTED
Staphylococcus epidermidis: NOT DETECTED
Staphylococcus lugdunensis: NOT DETECTED
Staphylococcus lugdunensis: NOT DETECTED
Staphylococcus species: NOT DETECTED
Staphylococcus species: DETECTED — AB
Stenotrophomonas maltophilia: NOT DETECTED
Stenotrophomonas maltophilia: NOT DETECTED
Streptococcus agalactiae: NOT DETECTED
Streptococcus agalactiae: NOT DETECTED
Streptococcus pneumoniae: NOT DETECTED
Streptococcus pneumoniae: NOT DETECTED
Streptococcus pyogenes: NOT DETECTED
Streptococcus pyogenes: NOT DETECTED
Streptococcus species: DETECTED — AB
Streptococcus species: NOT DETECTED

## 2023-04-23 LAB — ETHANOL: Alcohol, Ethyl (B): <10 mg/dL (ref <10)

## 2023-04-23 LAB — PHOSPHORUS: Phosphorus: 2.6 mg/dL (ref 2.5–4.6)

## 2023-04-23 LAB — CBC WITH DIFFERENTIAL/PLATELET
Abs Immature Granulocytes: 0.13 10*3/uL — ABNORMAL HIGH (ref 0.00–0.07)
Basophils Absolute: 0.1 10*3/uL (ref 0.0–0.1)
Basophils Relative: 0 %
Eosinophils Absolute: 0.2 10*3/uL (ref 0.0–0.5)
Eosinophils Relative: 1 %
HCT: 25.8 % — ABNORMAL LOW (ref 39.0–52.0)
Hemoglobin: 8.3 g/dL — ABNORMAL LOW (ref 13.0–17.0)
Immature Granulocytes: 1 %
Lymphocytes Relative: 18 %
Lymphs Abs: 3.2 10*3/uL (ref 0.7–4.0)
MCH: 27.6 pg (ref 26.0–34.0)
MCHC: 32.2 g/dL (ref 30.0–36.0)
MCV: 85.7 fL (ref 80.0–100.0)
Monocytes Absolute: 1.3 10*3/uL — ABNORMAL HIGH (ref 0.1–1.0)
Monocytes Relative: 7 %
Neutro Abs: 13 10*3/uL — ABNORMAL HIGH (ref 1.7–7.7)
Neutrophils Relative %: 73 %
Platelets: 607 10*3/uL — ABNORMAL HIGH (ref 150–400)
RBC: 3.01 MIL/uL — ABNORMAL LOW (ref 4.22–5.81)
RDW: 14.6 % (ref 11.5–15.5)
WBC: 17.8 10*3/uL — ABNORMAL HIGH (ref 4.0–10.5)
nRBC: 0 % (ref 0.0–0.2)

## 2023-04-23 LAB — MAGNESIUM: Magnesium: 1.6 mg/dL — ABNORMAL LOW (ref 1.7–2.4)

## 2023-04-23 LAB — RESPIRATORY PANEL BY PCR

## 2023-04-23 LAB — COMPREHENSIVE METABOLIC PANEL
ALT: 16 U/L (ref 0–44)
AST: 25 U/L (ref 15–41)
Albumin: 2.9 g/dL — ABNORMAL LOW (ref 3.5–5.0)
Alkaline Phosphatase: 81 U/L (ref 38–126)
Anion gap: 12 (ref 5–15)
BUN: 10 mg/dL (ref 8–23)
CO2: 22 mmol/L (ref 22–32)
Calcium: 8.6 mg/dL — ABNORMAL LOW (ref 8.9–10.3)
Chloride: 103 mmol/L (ref 98–111)
Creatinine, Ser: 0.33 mg/dL — ABNORMAL LOW (ref 0.61–1.24)
GFR, Estimated: >60 mL/min (ref >60)
Glucose, Bld: 146 mg/dL — ABNORMAL HIGH (ref 70–99)
Potassium: 3.5 mmol/L (ref 3.5–5.1)
Sodium: 137 mmol/L (ref 135–145)
Total Bilirubin: 0.9 mg/dL (ref 0.0–1.2)
Total Protein: 6.3 g/dL — ABNORMAL LOW (ref 6.5–8.1)

## 2023-04-23 LAB — CBG MONITORING, ED
Glucose-Capillary: 121 mg/dL — ABNORMAL HIGH (ref 70–99)
Glucose-Capillary: 133 mg/dL — ABNORMAL HIGH (ref 70–99)
Glucose-Capillary: 126 mg/dL — ABNORMAL HIGH (ref 70–99)
Glucose-Capillary: 134 mg/dL — ABNORMAL HIGH (ref 70–99)

## 2023-04-23 LAB — GLUCOSE, CAPILLARY
Glucose-Capillary: 153 mg/dL — ABNORMAL HIGH (ref 70–99)
Glucose-Capillary: 150 mg/dL — ABNORMAL HIGH (ref 70–99)

## 2023-04-23 LAB — PROTIME-INR
INR: 1.9 — ABNORMAL HIGH (ref 0.8–1.2)
Prothrombin Time: 21.9 s — ABNORMAL HIGH (ref 11.4–15.2)

## 2023-04-23 LAB — ABO/RH: ABO/RH(D): AB POS

## 2023-04-23 LAB — URINALYSIS, ROUTINE W REFLEX MICROSCOPIC
Bilirubin Urine: NEGATIVE
Glucose, UA: NEGATIVE mg/dL
Hgb urine dipstick: NEGATIVE
Ketones, ur: NEGATIVE mg/dL
Leukocytes,Ua: NEGATIVE
Nitrite: NEGATIVE
Protein, ur: NEGATIVE mg/dL
Specific Gravity, Urine: 1.039 — ABNORMAL HIGH (ref 1.005–1.030)
pH: 8 (ref 5.0–8.0)

## 2023-04-23 LAB — TYPE AND SCREEN
ABO/RH(D): AB POS
Antibody Screen: NEGATIVE

## 2023-04-23 LAB — GAMMA GT: GGT: 30 U/L (ref 7–50)

## 2023-04-23 MED ORDER — WARFARIN SODIUM 5 MG PO TABS
5.0000 mg | ORAL_TABLET | Freq: Once | ORAL | Status: DC
  Filled 2023-04-23 (×2): qty 1

## 2023-04-23 NOTE — Progress Notes (Signed)
PHARMACY - ANTICOAGULATION CONSULT NOTE  Pharmacy Consult for Warfarin Indication: atrial fibrillation  Allergies  Allergen Reactions   Lisinopril Cough    Patient Measurements: Height: 6\' 1"  (185.4 cm) Weight: 79 kg (174 lb 2.6 oz) IBW/kg (Calculated) : 79.9    Vital Signs: Temp: 97.7 F (36.5 C) (01/29 1104) Temp Source: Oral (01/29 1104) BP: 142/76 (01/29 1030) Pulse Rate: 104 (01/29 1030)  Labs: Recent Labs    04/21/23 1046 04/21/23 1105 04/22/23 0221 04/22/23 0406 04/22/23 0814 04/22/23 1512 04/22/23 1513 04/22/23 1649 04/22/23 1950 04/23/23 0611  HGB  --    < >  --  8.4*  --  8.9*  --   --   --  8.3*  HCT  --    < >  --  26.2*  --  28.7*  --   --   --  25.8*  PLT  --    < >  --  515*  --  659*  --   --   --  607*  LABPROT 65.8*   < > 19.5*  --  18.0*  --  17.4*  --   --   --   INR 7.8*   < > 1.6*  --  1.5*  --  1.4*  --   --   --   CREATININE  --    < >  --  0.58*  --  0.56*  --   --   --  0.33*  TROPONINIHS 7  --   --   --   --   --   --  5 5  --    < > = values in this interval not displayed.    Estimated Creatinine Clearance: 72.7 mL/min (A) (by C-G formula based on SCr of 0.33 mg/dL (L)).   Medical History: Past Medical History:  Diagnosis Date   A-fib (HCC)    Acute alcoholic pancreatitis 07/24/2019   Diverticulitis 11/13/2021   Heavy alcohol use 07/24/2019   Hematuria 11/14/2021   Hypertension    TIA (transient ischemic attack)     Medications:  PTA Warfarin regimen: 10mg  daily except 5mg  MWF  Assessment: 89 yr male recently admitted on 1/27 for spontaneous chest wall hematoma in the setting of supratherapeutic INR on chronic warfarin for afib.  He was discharged on 1/28, but was readmitted d/t syncope, sepsis with suspected biliary source.  Pharmacy is consulted to resume warfarin dosing.   Recent warfarin: PTA warfarin 10mg  TTSS, 5mg  MWF. - 1/27:  INR is 7.8,  Vitamin K 5mg  IV x 1 given in ED.  Hgb down from 11.9 to 9.0 - 1/28: INR  1.5, Hgb down to 8.4.  Warfarin 10mg    Today, 04/23/2023:  INR 1.9 CBC: Hgb down further to 8.3 No bleeding or complications reported by RN today.    Goal of Therapy:  INR 2-3 Monitor platelets by anticoagulation protocol: Yes   Plan:  Warfarin 5 mg PO x 1. Daily PT/INR. Monitor for signs and symptoms of bleeding.   Lynann Beaver PharmD, BCPS [facility] main pharmacy [phone] 04/23/2023 11:15 AM

## 2023-04-23 NOTE — Progress Notes (Signed)
PHARMACY - PHYSICIAN COMMUNICATION CRITICAL VALUE ALERT - BLOOD CULTURE IDENTIFICATION (BCID)  Christian Crawford is an 89 y.o. male who presented to [facility] on 04/22/2023 with a chief complaint of  Chief Complaint  Patient presents with   Loss of Consciousness     Assessment: Pancreatitis issues   Name of physician (or Provider) Contacted: Anthoney Harada, NP   Current antibiotics: Zosyn  Changes to prescribed antibiotics recommended:  Patient is on recommended antibiotics - No changes needed - 2/3 showing Strep species and Staph species. Lab reported these as being in separate bottles with no resistance. Would be covered by Zosyn   Results for orders placed or performed during the hospital encounter of 04/22/23  Blood Culture ID Panel (Reflexed) (Collected: 04/23/2023  9:46 AM)  Result Value Ref Range   Enterococcus faecalis NOT DETECTED NOT DETECTED   Enterococcus Faecium NOT DETECTED NOT DETECTED   Listeria monocytogenes NOT DETECTED NOT DETECTED   Staphylococcus species DETECTED (A) NOT DETECTED   Staphylococcus aureus (BCID) NOT DETECTED NOT DETECTED   Staphylococcus epidermidis NOT DETECTED NOT DETECTED   Staphylococcus lugdunensis NOT DETECTED NOT DETECTED   Streptococcus species NOT DETECTED NOT DETECTED   Streptococcus agalactiae NOT DETECTED NOT DETECTED   Streptococcus pneumoniae NOT DETECTED NOT DETECTED   Streptococcus pyogenes NOT DETECTED NOT DETECTED   A.calcoaceticus-baumannii NOT DETECTED NOT DETECTED   Bacteroides fragilis NOT DETECTED NOT DETECTED   Enterobacterales NOT DETECTED NOT DETECTED   Enterobacter cloacae complex NOT DETECTED NOT DETECTED   Escherichia coli NOT DETECTED NOT DETECTED   Klebsiella aerogenes NOT DETECTED NOT DETECTED   Klebsiella oxytoca NOT DETECTED NOT DETECTED   Klebsiella pneumoniae NOT DETECTED NOT DETECTED   Proteus species NOT DETECTED NOT DETECTED   Salmonella species NOT DETECTED NOT DETECTED   Serratia marcescens NOT  DETECTED NOT DETECTED   Haemophilus influenzae NOT DETECTED NOT DETECTED   Neisseria meningitidis NOT DETECTED NOT DETECTED   Pseudomonas aeruginosa NOT DETECTED NOT DETECTED   Stenotrophomonas maltophilia NOT DETECTED NOT DETECTED   Candida albicans NOT DETECTED NOT DETECTED   Candida auris NOT DETECTED NOT DETECTED   Candida glabrata NOT DETECTED NOT DETECTED   Candida krusei NOT DETECTED NOT DETECTED   Candida parapsilosis NOT DETECTED NOT DETECTED   Candida tropicalis NOT DETECTED NOT DETECTED   Cryptococcus neoformans/gattii NOT DETECTED NOT DETECTED    Adalberto Cole, PharmD, BCPS 04/23/2023 9:27 PM

## 2023-04-23 NOTE — Progress Notes (Signed)
PROGRESS NOTE  Christian Crawford ZOX:[id number] DOB: August 28, 1933 DOA: 04/22/2023 PCP: Ellyn Hack, MD  Brief History   The patient is a 89 yr old man who presented to [facility] ED on 04/22/2023 with complaints of syncope. The patient had been discharged to home earlier in the day after a brief admission for a supertherapeutic INR. He was on coumadin for atrial fibrillation, and presented to a spontaneous left chest wall hematoma. It was reversed with vitamin K. The INR was reduced to 1.5, and the patient was discharged on Lovenox. He stopped at a convenience store on his way home, got dizzy and passed out in the store. He did not strike his head. Exam in the ED was nonfocal. The patient did answer questions appropriately. Earlier this month the patient had undergone an MRCP and had visited GI which demonstrated an abnormal gallbladder up to 4.3 cm with circumferential wall thickening, also dilatation of the pancreatic duct and abnormal MRCP. Abdominal ultrasound on 04/22/2023 demonstrated no evidence of acute cholecystitis. CTA of the chest and abdomen demonstrated acute pancreatitis with possibly developing pseudocyst.  GI was consulted and determined that no ERCP was necessary. Recommendation is that the patient follow up with [facility] GI as previously planned as outpatient. EEG and echocardiogram has been ordered.  A & P  Syncope and collapse The patient has been admitted to a telemetry bed. He will also have an EEG and a echocargiogram. The patient did meet criteria for sepsis upon presentation. He was tachycardic, RR elevated to 23, and there was a leukocytosis of 17.8 WBC. The 17.8 was actually decreased from 21.5 on 04/22/2023. There is a putative source for sepsis in the patient's CTA chest abdomen and pelvis with acute on chronic pancreatitis and possible pseudocyst and possible cholecystitis.  Acute pancreatitis Mild, CT abdomen shows acute on chronic pancreatitis suspected may be just  starting. Lipid panel pending. No need for ERCP per GI. Recommendation is for follow up with [facility] GI as outpatient.  Abnormal magnetic resonance cholangiopancreatography (MRCP) Patient is being referred to [facility] for evaluation of stricture or mass.  See report below: IMPRESSION: 1. Dilatation of the pancreatic duct measuring up to 0.7 cm, with abrupt caliber change in the pancreatic head neck junction. Diffusely atrophic pancreatic parenchyma distal to this point. Generally normal appearance of the pancreatic head. This appearance has evolved over prior examinations dating back to 07/24/2019 and most likely reflects chronic sequelae of pancreatitis, without discretely visible mass. However, given motion limitations on today's examination consider ERCP to further evaluate for ductal stricture or occult mass. 2. 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 This requires no specific further follow-up given small size and advanced patient age. 3. No acute inflammatory findings in the abdomen at this time.  GI was consulted. They see no need for further work up at this time. Their recommendation is for the patient to follow up with [facility].   Aortic Atherosclerosis (ICD10-I70.0). Noted.   Leucocytosis Suspect secondary to biliary etiology. Pt was empirically started on zosyn. Will transition to augment for PO option for discharge.   Benign essential hypertension       Vitals:    04/22/23 1332 04/22/23 1746 04/22/23 1800 04/22/23 1830  BP: 101/88 (!) 152/89 (!) 142/81 137/73    04/22/23 1900 04/22/23 1915 04/22/23 1930 04/22/23 2000  BP: (!) 167/75 (!) 167/75 (!) 151/72 (!) 164/87  04/22/23 2100 04/22/23 2110  BP: (!) 142/73 (!) 142/73  Patient continued on metoprolol, losartan, amlodipine and hydralazine.   Paroxysmal atrial fibrillation (HCC) Currently in sinus  rhythm. Patient continued on Coumadin per pharmacy protocol.  Anemia     Latest Ref Rng & Units 04/22/2023    3:12 PM 04/22/2023    4:06 AM 04/21/2023   11:05 AM  CBC  WBC 4.0 - 10.5 K/uL 21.5  17.1  16.0   Hemoglobin 13.0 - 17.0 g/dL 8.9  8.4  9.0   Hematocrit 39.0 - 52.0 % 28.7  26.2  28.6   Platelets 150 - 400 K/uL 659  515  770   Patient is hemoglobin has been trending down from 11.9-8.9 today CT angio chest abdomen and pelvis is negative.  Chronic blood loss through the gut. -Type and screen, IV PPI, stool guaiac and follow.   Elevated INR (Resolved: 04/22/2023) INR/Prothrombin Time subtherapeutic will consult pharmacy for Coumadin management for paroxysmal atrial fibrillation.   Sliding scale insulin regimen.   DVT prophylaxis:  Coumadin  Consults:  None  Advance Care Planning:    Code Status: Prior    Family Communication:  None  Disposition Plan:  TBD   I have seen and examined this patient myself. I have spent 34 minutes in her evaluation and care.  [name], DO [facility] Direct contact: see [email or web address]  7PM-7AM contact night coverage as above 04/23/2023, 4:37 PM  LOS: 0 days   Consultants  Gastroenterology General surgery  Procedures  EEG ordered  Antibiotics   Anti-infectives (From admission, onward)    Start     Dose/Rate Route Frequency Ordered Stop   04/22/23 2330  piperacillin-tazobactam (ZOSYN) IVPB 3.375 g        3.375 g 12.5 mL/hr over 240 Minutes Intravenous Every 8 hours 04/22/23 2310          Interval History/Subjective  The patient is resting comfortably. No new complaints.  Objective   Vitals:  Vitals:   04/23/23 1345 04/23/23 1537  BP: (!) 154/76 (!) 154/63  Pulse: 78 85  Resp: 18 20  Temp:    SpO2: 98% 100%    Exam:  Constitutional:  The patient is awake, alert, and oriented x 3. No acute distress. Respiratory:  No increased work of breathing. No wheezes, rales, or rhonchi No tactile  fremitus Cardiovascular:  Regular rate and rhythm No murmurs, ectopy, or gallups. No lateral PMI. No thrills. Abdomen:  Abdomen is soft, non-tender, non-distended No hernias, masses, or organomegaly Normoactive bowel sounds.  Musculoskeletal:  No cyanosis, clubbing, or edema Skin:  No rashes, lesions, ulcers palpation of skin: no induration or nodules Neurologic:  CN 2-12 intact Sensation all 4 extremities intact Psychiatric:  Mental status Mood, affect appropriate Orientation to person, place, time  judgment and insight appear intact  I have personally reviewed the following:   Today's Data   Vitals:   04/23/23 1345 04/23/23 1537  BP: (!) 154/76 (!) 154/63  Pulse: 78 85  Resp: 18 20  Temp:    SpO2: 98% 100%     Lab Data  CBC    Component Value Date/Time   WBC 17.8 (H) 04/23/2023 0611   RBC 3.01 (L) 04/23/2023 0611   HGB 8.3 (L) 04/23/2023 0611   HCT 25.8 (L) 04/23/2023 0611   PLT 607 (H) 04/23/2023 0611   MCV 85.7 04/23/2023 0611   MCH 27.6 04/23/2023 0611   MCHC 32.2 04/23/2023 0611   RDW 14.6 04/23/2023 2952  LYMPHSABS 3.2 04/23/2023 0611   MONOABS 1.3 (H) 04/23/2023 0611   EOSABS 0.2 04/23/2023 0611   BASOSABS 0.1 04/23/2023 0611      Latest Ref Rng & Units 04/23/2023    6:11 AM 04/22/2023    3:12 PM 04/22/2023    4:06 AM  BMP  Glucose 70 - 99 mg/dL 295  621  308   BUN 8 - 23 mg/dL 10  10  9    Creatinine 0.61 - 1.24 mg/dL 6.57  8.46  9.62   Sodium 135 - 145 mmol/L 137  136  134   Potassium 3.5 - 5.1 mmol/L 3.5  4.0  3.5   Chloride 98 - 111 mmol/L 103  104  103   CO2 22 - 32 mmol/L 22  21  24    Calcium 8.9 - 10.3 mg/dL 8.6  8.3  8.0      Micro Data   Results for orders placed or performed during the hospital encounter of 04/22/23  Culture, blood (Routine X 2) w Reflex to ID Panel     Status: None (Preliminary result)   Collection Time: 04/22/23  9:53 PM   Specimen: BLOOD RIGHT FOREARM  Result Value Ref Range Status   Specimen Description    Final    BLOOD RIGHT FOREARM Performed at [facility], [address] [place], [place] [address]    Special Requests   Final    BOTTLES DRAWN AEROBIC AND ANAEROBIC Blood Culture results may not be optimal due to an inadequate volume of blood received in culture bottles Performed at [facility], [address] [place], [place] [address]    Culture   Final    NO GROWTH < 12 HOURS Performed at [facility], [address] [place], [place] [address]    Report Status PENDING  Incomplete  Culture, blood (Routine X 2) w Reflex to ID Panel     Status: None (Preliminary result)   Collection Time: 04/22/23  9:54 PM   Specimen: BLOOD LEFT FOREARM  Result Value Ref Range Status   Specimen Description   Final    BLOOD LEFT FOREARM Performed at [facility], [address] [place], [place] [address]    Special Requests   Final    BOTTLES DRAWN AEROBIC ONLY Blood Culture results may not be optimal due to an inadequate volume of blood received in culture bottles Performed at [facility], [address], [place], [place] [address]    Culture   Final    NO GROWTH < 12 HOURS Performed at [facility], [address], [place], [place] [address]    Report Status PENDING  Incomplete  Respiratory (~20 pathogens) panel by PCR     Status: None   Collection Time: 04/22/23 11:00 PM   Specimen: Nasopharyngeal Swab; Respiratory  Result Value Ref Range Status   Adenovirus NOT DETECTED NOT DETECTED Final   Coronavirus 229E NOT DETECTED NOT DETECTED Final    Comment: (NOTE) The Coronavirus on the Respiratory Panel, DOES NOT test for the novel  Coronavirus (2019 nCoV)    Coronavirus HKU1 NOT DETECTED NOT DETECTED Final   Coronavirus NL63 NOT DETECTED NOT DETECTED Final   Coronavirus OC43 NOT DETECTED NOT DETECTED Final   Metapneumovirus NOT DETECTED NOT DETECTED Final   Rhinovirus / Enterovirus NOT DETECTED NOT DETECTED Final    Influenza A NOT DETECTED NOT DETECTED Final   Influenza B NOT DETECTED NOT DETECTED Final  Parainfluenza Virus 1 NOT DETECTED NOT DETECTED Final   Parainfluenza Virus 2 NOT DETECTED NOT DETECTED Final   Parainfluenza Virus 3 NOT DETECTED NOT DETECTED Final   Parainfluenza Virus 4 NOT DETECTED NOT DETECTED Final   Respiratory Syncytial Virus NOT DETECTED NOT DETECTED Final   Bordetella pertussis NOT DETECTED NOT DETECTED Final   Bordetella Parapertussis NOT DETECTED NOT DETECTED Final   Chlamydophila pneumoniae NOT DETECTED NOT DETECTED Final   Mycoplasma pneumoniae NOT DETECTED NOT DETECTED Final    Comment: Performed at [facility], [address], [place], [place] [address]   Imaging  CTA chest abdomen and pelvis.  Cardiology Data  EKG  Scheduled Meds:  amLODipine  10 mg Oral Daily   insulin aspart  0-6 Units Subcutaneous Q4H   losartan  25 mg Oral Daily   metoprolol tartrate  75 mg Oral BID   thiamine (VITAMIN B1) injection  100 mg Intravenous Daily   warfarin  5 mg Oral ONCE-1600   Warfarin - Pharmacist Dosing Inpatient   Does not apply q1600   Continuous Infusions:  piperacillin-tazobactam (ZOSYN)  IV 3.375 g (04/23/23 1628)    Principal Problem:   Syncope and collapse Active Problems:   Acute pancreatitis   Abnormal magnetic resonance cholangiopancreatography (MRCP)   Leucocytosis   Benign essential hypertension   Paroxysmal atrial fibrillation (HCC)   Anemia   LOS: 0 days

## 2023-04-23 NOTE — Consult Note (Addendum)
Consultation  Referring Provider:   Dr. Fran Lowes [facility] Primary Care Physician:  Ellyn Hack, MD Primary Gastroenterologist:  Dr. Barron Alvine       Reason for Consultation:   Acute pancreatitis on CT in setting of leukocytosis/syncope DOA: 04/22/2023         Hospital Day: 2         HPI:   Christian Crawford is a 89 y.o. male with past medical history significant for atrial fibrillation on Coumadin, hypertension, history of TIA on aspirin, type 2 diabetes, history of alcoholic pancreatitis, GERD.   Patient known to Dr. Barron Alvine last seen in the office 04/11/2023. Patient's had multiple ER visits and admissions for acute pancreatitis starting 10/2021, again 01/21/2023. Patient had MRCP 02/24/2023 that showed dilated pancreatic duct 0.7 cm with abrupt caliber change pancreatic head/neck diffusely atrophic pancreatic parenchymal distal to this area favors more chronic sequelae of pancreatitis without discrete mass likely sidebranch IPMN or pseudocyst.   Patient was referred to [facility] advance GI service for EUS.   Presents to the ER after syncopal episode after being discharged from the hospital 1/28 for supratherapeutic INR with left wall hematoma INR 7.8, 1.5 on discharge.  Work up notable for sepsis criteria Lactic acid 1.8 WBC 21.5, Hgb 8.9, thrombocytosis 659, patient's had a leukocytosis since 01/21/2023 with elevated thrombocytosis, anemia more so since hematoma. INR 1.5 Normal kidney function unremarkable liver function albumin 2.6, normal bilirubin, lipase 49, GGT negative, ethanol pending Pending blood cultures, no growth 12 hours CT head without contrast chronic small vessel ischemic changes but no acute findings,  MRI brain without contrast CT chest no infection or acute findings  CT angio chest abdomen pelvis negative for PE, no dissections.  No gallstones, gallbladder wall thickening or biliary dilation.  Pancreas demonstrates inflammatory changes consistent with  pancreatitis with dilation of pancreatic duct noted overall stable from the previous examination.  Diverticulosis without diverticulitis  RUQ US shows no evidence of  acute cholecystitis  urine unremarkable,  respiratory panel negative  Patient with family at bedside, daughters. Provided some of the history.  Patient states for the past week when he was in the hospital for supratherapeutic INR he did not have his dentures and was unable to eat properly.  He was hungry when he left was discharged 1/28 He was going into a store when he began to feel funny states he had some dizziness mild shortness of breath going into the store and then per the family the clerk saw the patient about to fall down and put a chair underneath him.  Per their report he did have urinary incontinence had some shaking and eyes rolling to the back of his head. Patient does not have good recollection of what happened but states the week prior in the hospital and since the episode is happened he denies any abdominal pain, nausea.  He had 1 episode of vomiting directly after the syncopal episode. Patient's last bowel movement was Sunday but states this is not abnormal for him and he has not had many solid foods.  He denies any melena or hematochezia or tan-colored stools. Denies fever, chills, rashes, joint pain, body aches.  Patient states he has not had any alcohol in the past 3 years but does drink NyQuil 1-2 times a week for sleep.  Remote history of smoking no drug use.   Abnormal ED labs: Abnormal Labs Reviewed  BASIC METABOLIC PANEL - Abnormal; Notable for the following components:  Result Value   CO2 21 (*)    Glucose, Bld 186 (*)    Creatinine, Ser 0.56 (*)    Calcium 8.3 (*)    All other components within normal limits  CBC - Abnormal; Notable for the following components:   WBC 21.5 (*)    RBC 3.26 (*)    Hemoglobin 8.9 (*)    HCT 28.7 (*)    Platelets 659 (*)    All other components within normal  limits  URINALYSIS, ROUTINE W REFLEX MICROSCOPIC - Abnormal; Notable for the following components:   Specific Gravity, Urine 1.039 (*)    All other components within normal limits  PROTIME-INR - Abnormal; Notable for the following components:   Prothrombin Time 17.4 (*)    INR 1.4 (*)    All other components within normal limits  HEPATIC FUNCTION PANEL - Abnormal; Notable for the following components:   Total Protein 6.3 (*)    Albumin 2.7 (*)    All other components within normal limits  LIPID PANEL - Abnormal; Notable for the following components:   HDL 32 (*)    All other components within normal limits  COMPREHENSIVE METABOLIC PANEL - Abnormal; Notable for the following components:   Glucose, Bld 146 (*)    Creatinine, Ser 0.33 (*)    Calcium 8.6 (*)    Total Protein 6.3 (*)    Albumin 2.9 (*)    All other components within normal limits  MAGNESIUM - Abnormal; Notable for the following components:   Magnesium 1.6 (*)    All other components within normal limits  CBC WITH DIFFERENTIAL/PLATELET - Abnormal; Notable for the following components:   WBC 17.8 (*)    RBC 3.01 (*)    Hemoglobin 8.3 (*)    HCT 25.8 (*)    Platelets 607 (*)    Neutro Abs 13.0 (*)    Monocytes Absolute 1.3 (*)    Abs Immature Granulocytes 0.13 (*)    All other components within normal limits  CBG MONITORING, ED - Abnormal; Notable for the following components:   Glucose-Capillary 154 (*)    All other components within normal limits  CBG MONITORING, ED - Abnormal; Notable for the following components:   Glucose-Capillary 144 (*)    All other components within normal limits  CBG MONITORING, ED - Abnormal; Notable for the following components:   Glucose-Capillary 121 (*)    All other components within normal limits  CBG MONITORING, ED - Abnormal; Notable for the following components:   Glucose-Capillary 133 (*)    All other components within normal limits    Past Medical History:  Diagnosis  Date   A-fib (HCC)    Acute alcoholic pancreatitis 07/24/2019   Diverticulitis 11/13/2021   Heavy alcohol use 07/24/2019   Hematuria 11/14/2021   Hypertension    TIA (transient ischemic attack)     Surgical History:  He  has a past surgical history that includes Eye surgery. Family History:  His family history includes Colon cancer in his brother. Social History:   reports that he has quit smoking. His smoking use included cigarettes. He has a 100 pack-year smoking history. He has never used smokeless tobacco. He reports current alcohol use. He reports that he does not use drugs.  Prior to Admission medications   Medication Sig Start Date End Date Taking? Authorizing Provider  albuterol (VENTOLIN HFA) 108 (90 Base) MCG/ACT inhaler Inhale 1-2 puffs into the lungs every 6 (six) hours as needed for wheezing  or shortness of breath. 11/12/21  Yes [provider]  amLODipine (NORVASC) 10 MG tablet Take 10 mg by mouth daily.   Yes [provider]  BOTOX 100 units SOLR injection Inject 100 Units into the muscle See admin instructions. Inject 100 units into the eye muscle every 4 months.   Yes [provider]  latanoprost (XALATAN) 0.005 % ophthalmic solution Place 1 drop into both eyes at bedtime. 06/10/19  Yes [provider]  losartan (COZAAR) 25 MG tablet Take 25 mg by mouth daily.   Yes [provider]  metFORMIN (GLUCOPHAGE-XR) 500 MG 24 hr tablet Take 500 mg by mouth in the morning and at bedtime.   Yes [provider]  metoprolol tartrate (LOPRESSOR) 50 MG tablet Take 75 mg by mouth in the morning and at bedtime.   Yes [provider]  ondansetron (ZOFRAN) 4 MG tablet Take 1 tablet (4 mg total) by mouth every 4 (four) hours as needed for nausea or vomiting. 04/11/23  Yes Cirigliano, Vito V, DO  warfarin (COUMADIN) 5 MG tablet Take 1 tablet (5 mg total) by mouth daily at 4 PM. Patient taking differently: Take 5-10 mg by mouth See  admin instructions. Take 10 mg by mouth at 6 PM on Sun/Tues/Thurs/Sat and 5 mg on Mon/Wed/Fri 07/26/19  Yes Shahmehdi, Seyed A, MD  enoxaparin (LOVENOX) 80 MG/0.8ML injection Inject 0.8 mLs (80 mg total) into the skin 2 (two) times daily for 7 days. Follow up for INR check 1/30 as scheduled Patient not taking: Reported on 04/22/2023 04/22/23 04/29/23  Azucena Fallen, MD  rosuvastatin (CRESTOR) 20 MG tablet Take 20 mg by mouth at bedtime. Patient not taking: Reported on 04/22/2023    [provider]    Current Facility-Administered Medications  Medication Dose Route Frequency Provider Last Rate Last Admin   amLODipine (NORVASC) tablet 10 mg  10 mg Oral Daily Gertha Calkin, MD   10 mg at 04/23/23 1013   insulin aspart (novoLOG) injection 0-6 Units  0-6 Units Subcutaneous Q4H Gertha Calkin, MD       losartan (COZAAR) tablet 25 mg  25 mg Oral Daily Irena Cords V, MD   25 mg at 04/23/23 1013   metoprolol tartrate (LOPRESSOR) tablet 75 mg  75 mg Oral BID Gertha Calkin, MD   75 mg at 04/23/23 1013   piperacillin-tazobactam (ZOSYN) IVPB 3.375 g  3.375 g Intravenous Q8H Poindexter, Leann T, RPH   Stopped at 04/23/23 0725   thiamine (VITAMIN B1) injection 100 mg  100 mg Intravenous Daily Gertha Calkin, MD   100 mg at 04/23/23 1013   Warfarin - Pharmacist Dosing Inpatient   Does not apply q1600 Poindexter, Sharion Balloon, [facility]       Current Outpatient Medications  Medication Sig Dispense Refill   albuterol (VENTOLIN HFA) 108 (90 Base) MCG/ACT inhaler Inhale 1-2 puffs into the lungs every 6 (six) hours as needed for wheezing or shortness of breath.     amLODipine (NORVASC) 10 MG tablet Take 10 mg by mouth daily.     BOTOX 100 units SOLR injection Inject 100 Units into the muscle See admin instructions. Inject 100 units into the eye muscle every 4 months.     latanoprost (XALATAN) 0.005 % ophthalmic solution Place 1 drop into both eyes at bedtime.     losartan (COZAAR) 25 MG tablet Take 25 mg by mouth  daily.     metFORMIN (GLUCOPHAGE-XR) 500 MG 24 hr tablet Take 500  mg by mouth in the morning and at bedtime.     metoprolol tartrate (LOPRESSOR) 50 MG tablet Take 75 mg by mouth in the morning and at bedtime.     ondansetron (ZOFRAN) 4 MG tablet Take 1 tablet (4 mg total) by mouth every 4 (four) hours as needed for nausea or vomiting. 60 tablet 3   warfarin (COUMADIN) 5 MG tablet Take 1 tablet (5 mg total) by mouth daily at 4 PM. (Patient taking differently: Take 5-10 mg by mouth See admin instructions. Take 10 mg by mouth at 6 PM on Sun/Tues/Thurs/Sat and 5 mg on Mon/Wed/Fri) 30 tablet 1   enoxaparin (LOVENOX) 80 MG/0.8ML injection Inject 0.8 mLs (80 mg total) into the skin 2 (two) times daily for 7 days. Follow up for INR check 1/30 as scheduled (Patient not taking: Reported on 04/22/2023) 11.2 mL 0   rosuvastatin (CRESTOR) 20 MG tablet Take 20 mg by mouth at bedtime. (Patient not taking: Reported on 04/22/2023)      Allergies as of 04/22/2023 - Review Complete 04/22/2023  Allergen Reaction Noted   Lisinopril Cough 04/21/2023    Review of Systems:    Constitutional: No weight loss, fever, chills, weakness or fatigue HEENT: Eyes: No change in vision               Ears, Nose, Throat:  No change in hearing or congestion Skin: No rash or itching Cardiovascular: No chest pain, chest pressure or palpitations   Respiratory: No SOB or cough Gastrointestinal: See HPI and otherwise negative Genitourinary: No dysuria or change in urinary frequency Neurological: No headache, dizziness or syncope Musculoskeletal: No new muscle or joint pain Hematologic: No bleeding or bruising Psychiatric: No history of depression or anxiety     Physical Exam:  Vital signs in last 24 hours: Temp:  [97.6 F (36.4 C)-98.4 F (36.9 C)] 98 F (36.7 C) (01/29 0700) Pulse Rate:  [78-122] 93 (01/29 1013) Resp:  [13-40] 22 (01/29 0830) BP: (101-168)/(71-89) 164/80 (01/29 1013) SpO2:  [97 %-100 %] 100 % (01/29  0830) Weight:  [79 kg] 79 kg (01/28 1325) Last BM Date : 04/22/23 Last BM recorded by nurses in past 5 days No data recorded  General:   Pleasant, well developed male in no acute distress Head:  Normocephalic and atraumatic. Eyes: sclerae anicteric,conjunctive pink  Heart:  regular rate and rhythm, no murmurs or gallops Pulm: Clear anteriorly; no wheezing Abdomen:  Soft, Non-distended AB, Active bowel sounds. No tenderness , No organomegaly appreciated. Extremities:  Without edema. Msk:  Symmetrical without gross deformities. Peripheral pulses intact.  Neurologic:  Alert and  oriented x4;  No focal deficits.  Skin:   Dry and intact without significant lesions or rashes. Psychiatric:  Cooperative. Normal mood and affect.  LAB RESULTS: Recent Labs    04/22/23 0406 04/22/23 1512 04/23/23 0611  WBC 17.1* 21.5* 17.8*  HGB 8.4* 8.9* 8.3*  HCT 26.2* 28.7* 25.8*  PLT 515* 659* 607*   BMET Recent Labs    04/22/23 0406 04/22/23 1512 04/23/23 0611  NA 134* 136 137  K 3.5 4.0 3.5  CL 103 104 103  CO2 24 21* 22  GLUCOSE 138* 186* 146*  BUN 9 10 10   CREATININE 0.58* 0.56* 0.33*  CALCIUM 8.0* 8.3* 8.6*   LFT Recent Labs    04/22/23 2043 04/23/23 0611  PROT 6.3* 6.3*  ALBUMIN 2.7* 2.9*  AST 28 25  ALT 16 16  ALKPHOS 86 81  BILITOT 1.1 0.9  BILIDIR 0.2  --  IBILI 0.9  --    PT/INR Recent Labs    04/22/23 0814 04/22/23 1513  LABPROT 18.0* 17.4*  INR 1.5* 1.4*    STUDIES: US Abdomen Limited RUQ (LIVER/GB) Result Date: 04/22/2023 CLINICAL DATA:  Pancreatitis, abnormal gallbladder imaging EXAM: ULTRASOUND ABDOMEN LIMITED RIGHT UPPER QUADRANT COMPARISON:  Same day CT abdomen and pelvis FINDINGS: Gallbladder: No gallstones or wall thickening visualized. No sonographic Murphy sign noted by sonographer. Common bile duct: Diameter: 7 mm.  No intrahepatic biliary dilation. Liver: No focal lesion identified. Increased parenchymal echogenicity. Portal vein is patent on color  Doppler imaging with normal direction of blood flow towards the liver. Other: None. IMPRESSION: No evidence of acute cholecystitis. Electronically Signed   By: Minerva Fester M.D.   On: 04/22/2023 23:46   MR BRAIN WO CONTRAST Result Date: 04/22/2023 CLINICAL DATA:  Mental status change, unknown cause EXAM: MRI HEAD WITHOUT CONTRAST TECHNIQUE: Multiplanar, multiecho pulse sequences of the brain and surrounding structures were obtained without intravenous contrast. COMPARISON:  CT head from today. FINDINGS: Brain: No acute infarction, hemorrhage, hydrocephalus, extra-axial collection or mass lesion. Patchy T2/FLAIR hyperintensities in the white matter are nonspecific but compatible with chronic microvascular ischemic change. Cerebral atrophy. Vascular: Major arterial flow voids are maintained at the skull base. Skull and upper cervical spine: Normal marrow signal. Sinuses/Orbits: Clear sinuses.  No acute orbital findings. Other: No mastoid effusions. IMPRESSION: No evidence of acute intracranial abnormality. Electronically Signed   By: Feliberto Harts M.D.   On: 04/22/2023 22:17   CT Angio Chest/Abd/Pel for Dissection W and/or W/WO Result Date: 04/22/2023 CLINICAL DATA:  Recent syncopal episode EXAM: CT ANGIOGRAPHY CHEST, ABDOMEN AND PELVIS TECHNIQUE: Non-contrast CT of the chest was initially obtained. Multidetector CT imaging through the chest, abdomen and pelvis was performed using the standard protocol during bolus administration of intravenous contrast. Multiplanar reconstructed images and MIPs were obtained and reviewed to evaluate the vascular anatomy. RADIATION DOSE REDUCTION: This exam was performed according to the departmental dose-optimization program which includes automated exposure control, adjustment of the mA and/or kV according to patient size and/or use of iterative reconstruction technique. CONTRAST:  OMNIPAQUE IOHEXOL 350 MG/ML SOLN COMPARISON:  04/21/2023 FINDINGS: CTA CHEST  FINDINGS Cardiovascular: Initial precontrast images demonstrate heavy atherosclerotic calcifications of the aorta. No aneurysmal dilatation is noted. Coronary calcifications are seen. Post-contrast images demonstrate no evidence of aortic dissection or aneurysmal dilatation. Pulmonary artery as visualized is within normal limits without evidence of pulmonary embolism. No cardiac enlargement is seen. Stable small pericardial effusion is noted. Mediastinum/Nodes: Thoracic inlet is within normal limits. No hilar or mediastinal adenopathy is noted. The esophagus as visualized is within normal limits. Lungs/Pleura: Lungs are well aerated bilaterally. Mild left basilar atelectasis is noted. No sizable effusion is seen. Stable 3 mm nodule is noted in the right upper lobe. Musculoskeletal: No acute rib abnormality is noted. Degenerative changes of the thoracic spine are seen. Review of the MIP images confirms the above findings. CTA ABDOMEN AND PELVIS FINDINGS VASCULAR Aorta: Atherosclerotic calcifications are noted without aneurysmal dilatation or dissection. Celiac: Patent without evidence of aneurysm, dissection, vasculitis or significant stenosis. SMA: Patent without evidence of aneurysm, dissection, vasculitis or significant stenosis. Renals: Both renal arteries are patent without evidence of aneurysm, dissection, vasculitis, fibromuscular dysplasia or significant stenosis. IMA: Patent without evidence of aneurysm, dissection, vasculitis or significant stenosis. Inflow: Iliacs demonstrate diffuse atherosclerotic calcification. Focal minimal dissection is noted in the right common iliac artery. No flow limitation is noted. No other arterial abnormality  is seen. Veins: No obvious venous abnormality within the limitations of this arterial phase study. Review of the MIP images confirms the above findings. NON-VASCULAR Hepatobiliary: No focal liver abnormality is seen. No gallstones, gallbladder wall thickening, or biliary  dilatation. Pancreas: Pancreas again demonstrates inflammatory change consistent with pancreatitis. Dilatation of the pancreatic duct is noted. The overall appearance is stable from the previous exam. Spleen: No splenic injury or perisplenic hematoma. Adrenals/Urinary Tract: Adrenal glands are within normal limits. Kidneys demonstrate a normal enhancement pattern bilaterally. No obstructive changes are seen. The bladder is partially distended with opacified urine. Stomach/Bowel: Scattered diverticular change of the colon is noted. No diverticulitis is seen. The appendix is well visualized and within normal limits. Small bowel and stomach are within normal limits. Lymphatic: No sizable adenopathy is seen. Reproductive: Prostate is unremarkable. Other: No abdominal wall hernia or abnormality. No abdominopelvic ascites. Musculoskeletal: No acute or significant osseous findings. Review of the MIP images confirms the above findings. IMPRESSION: CTA of the chest: No evidence of pulmonary emboli. No evidence of aortic dissection. Stable 3 mm nodule in the right upper lobe. No follow-up is recommended. CTA of the abdomen and pelvis: No acute arterial abnormality is seen. Stable changes of acute pancreatitis. 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 Diverticulosis without diverticulitis. Electronically Signed   By: Alcide Clever M.D.   On: 04/22/2023 21:00   DG Chest Portable 1 View Result Date: 04/22/2023 CLINICAL DATA:  Syncope. EXAM: PORTABLE CHEST 1 VIEW COMPARISON:  Radiograph 04/18/2023 FINDINGS: Stable cardiomediastinal silhouette. Aortic atherosclerotic calcification. No focal consolidation, pleural effusion, or pneumothorax. No displaced rib fractures. IMPRESSION: No acute cardiopulmonary disease. Electronically Signed   By: Minerva Fester M.D.   On: 04/22/2023 17:15   CT Head Wo Contrast Result Date: 04/22/2023 CLINICAL DATA:  Mental status change of unknown cause. Syncopal  episode with trauma to the head. EXAM: CT HEAD WITHOUT CONTRAST TECHNIQUE: Contiguous axial images were obtained from the base of the skull through the vertex without intravenous contrast. RADIATION DOSE REDUCTION: This exam was performed according to the departmental dose-optimization program which includes automated exposure control, adjustment of the mA and/or kV according to patient size and/or use of iterative reconstruction technique. COMPARISON:  05/09/2019 FINDINGS: Brain: Age related volume loss. Chronic small-vessel ischemic changes of the white matter. No sign of acute infarction, mass lesion, hemorrhage, hydrocephalus or extra-axial collection. Vascular: There is atherosclerotic calcification of the major vessels at the base of the brain. Skull: No skull fracture. Sinuses/Orbits: Clear/normal Other: None IMPRESSION: No acute CT finding. Age related volume loss. Chronic small-vessel ischemic changes of the white matter. Electronically Signed   By: Paulina Fusi M.D.   On: 04/22/2023 16:34   CT CHEST ABDOMEN PELVIS W CONTRAST Result Date: 04/21/2023 CLINICAL DATA:  Left chest wall bruise. Rule out bleeding or traumatic injury to the chest. Shortness of breath. EXAM: CT CHEST, ABDOMEN, AND PELVIS WITH CONTRAST TECHNIQUE: Multidetector CT imaging of the chest, abdomen and pelvis was performed following the standard protocol during bolus administration of intravenous contrast. RADIATION DOSE REDUCTION: This exam was performed according to the departmental dose-optimization program which includes automated exposure control, adjustment of the mA and/or kV according to patient size and/or use of iterative reconstruction technique. CONTRAST:  OMNIPAQUE IOHEXOL 300 MG/ML  SOLN COMPARISON:  CT of the abdomen pelvis dated 04/16/2023. FINDINGS: CT CHEST FINDINGS Cardiovascular: There is no cardiomegaly. Small pericardial effusion measuring up to 8 mm in thickness anterior to the heart.  There is 3 vessel  coronary vascular calcification. Moderate atherosclerotic calcification of the thoracic aorta. No aneurysmal dilatation or dissection. No periaortic fluid collection. The central pulmonary arteries appear patent. Mediastinum/Nodes: No hilar or mediastinal adenopathy. The esophagus is grossly unremarkable. No mediastinal fluid collection. Lungs/Pleura: Linear atelectasis/scarring in the lingula. A 3 mm right apical nodule (29/6). No focal consolidation, pleural effusion, or pneumothorax. The central airways are patent. Musculoskeletal: Degenerative changes of the spine and osteopenia. No acute osseous pathology. There is stranding of the fat plane of the left axilla and shoulder area. No large fluid collection or hematoma. Mild asymmetric enlargement of the left shoulder musculature involving the medial aspect of the subscapularis suspicious for a small intramuscular hematoma. CT ABDOMEN PELVIS FINDINGS No intra-abdominal free air or free fluid. Hepatobiliary: The liver is unremarkable. No biliary dilatation. The gallbladder is unremarkable. Pancreas: Inflammatory changes of the pancreas in keeping with no pancreatitis. There is dilatation of the main pancreatic duct as on the prior CT. No drainable fluid collection/abscess or pseudocyst. Spleen: Normal in size without focal abnormality.  The uterus Adrenals/Urinary Tract: Glands are unremarkable. There is no hydronephrosis on either side. There is symmetric enhancement and excretion of contrast by both kidneys. The visualized ureters and urinary bladder appear unremarkable. Stomach/Bowel: There is sigmoid diverticulosis without active inflammatory changes. There is no bowel obstruction or active inflammation. The appendix is normal. Vascular/Lymphatic: Moderate aortoiliac atherosclerotic disease. The IVC is unremarkable. No portal venous gas. There is no adenopathy. Reproductive: Mildly enlarged prostate gland measuring 6 cm in transverse axial diameter. The seminal  vesicles are symmetric. Other: None Musculoskeletal: Degenerative changes of the hips bilaterally, left greater than right. Osteopenia. Similar appearance of a sclerotic area in the superior aspect of L3, likely a Schmorl's node. No acute osseous pathology. IMPRESSION: 1. Probable small intramuscular hematoma involving the medial left subscapularis muscle. No fluid collection or large hematoma. 2. Acute pancreatitis. No drainable fluid collection/abscess or pseudocyst. 3. Sigmoid diverticulosis. No bowel obstruction. Normal appendix. 4.  Aortic Atherosclerosis (ICD10-I70.0). Electronically Signed   By: Elgie Collard M.D.   On: 04/21/2023 12:39      Impression/Plan:   Acute on chronic pancreatitis CT angio chest abdomen pelvis showed stable changes of acute pancreatitis no gallstones, no gallbladder wall thickening no biliary dilatation.  Dilation of pancreatic duct is noted but this is stable from previous exam. Right upper quadrant ultrasound shows no evidence of acute cholecystitis no biliary dilation Patient had normal LFTs, lipase 49 On examination no abdominal discomfort, no nausea vomiting. Patient may have underlying acute pancreatitis from possible pancreatic duct obstruction but currently with CT and exam this is more patient's chronic pancreatitis. I do not believe this is contributing to patient's syncopal episode, could be contributing to patient's leukocytosis/thrombocytosis that he has had smoldering since October there is no other source of infection. -Can monitor liver function -Continue follow-up planned with [facility] GI in March for EUS -No additional follow-up recommended here at this time further imitations productive.  Syncopal episode Normal CT head normal MRI brain Questionable seizure activity Consider neurology/EEG  Leukocytosis with thrombocytosis Ongoing since October Afebrile CT angio chest abdomen pelvis unremarkable Pending blood cultures Negative urine  and respiratory panel Question possible leukocytosis of actual seizure activity versus more inflammation than infection  Acute on chronic anemia Recent supratherapeutic INR with left chest hematoma Denies melena, hematochezia no evidence of GI bleeding Check iron, ferritin, B12 Monitor H&H transfuse to keep greater than 7 or 8  Principal Problem:  Syncope and collapse Active Problems:   Acute pancreatitis   Benign essential hypertension   Paroxysmal atrial fibrillation (HCC)   Leucocytosis   Abnormal magnetic resonance cholangiopancreatography (MRCP)   Anemia    LOS: 0 days   Thank you for your kind consultation, we will continue to follow.   Doree Albee  04/23/2023, 10:21 AM  GI ATTENDING  History, laboratories, x-rays, outpatient GI notes personally reviewed.  Patient seen and examined.  Agree with comprehensive consultation note as outlined above.  The patient presents with syncope.  We are asked to see him regarding abnormal CT of the pancreas.  Patient has well-documented chronic pancreatitis.  Currently without abdominal pain.  CT shows subtle inflammatory changes about the pancreas.  He has no abdominal pain.  Lipase normal.  This patient does not have clinically significant acute pancreatitis.  He does have a well-known documented chronic pancreatitis.  There are no obvious GI issues to explain his admission problems syncope.  No further plans from GI perspective.  He may be seen in the outpatient GI arena as needed.  Wilhemina Bonito. Eda Keys., M.D. [facility] Division of Gastroenterology

## 2023-04-24 ENCOUNTER — Inpatient Hospital Stay (HOSPITAL_COMMUNITY)
Admit: 2023-04-24 | Discharge: 2023-04-24 | Disposition: A | Discharge status: Home / Self Care | Payer: Medicare HMO | Attending: Internal Medicine | Admitting: Internal Medicine

## 2023-04-24 ENCOUNTER — Inpatient Hospital Stay (HOSPITAL_COMMUNITY): Payer: Medicare HMO

## 2023-04-24 DIAGNOSIS — R55 Syncope and collapse: Secondary | ICD-10-CM | POA: Diagnosis not present

## 2023-04-24 DIAGNOSIS — R569 Unspecified convulsions: Secondary | ICD-10-CM

## 2023-04-24 LAB — GLUCOSE, CAPILLARY
Glucose-Capillary: 118 mg/dL — ABNORMAL HIGH (ref 70–99)
Glucose-Capillary: 127 mg/dL — ABNORMAL HIGH (ref 70–99)
Glucose-Capillary: 113 mg/dL — ABNORMAL HIGH (ref 70–99)
Glucose-Capillary: 143 mg/dL — ABNORMAL HIGH (ref 70–99)

## 2023-04-24 LAB — ECHOCARDIOGRAM COMPLETE
AR max vel: 1.75 cm2
AV Area VTI: 1.46 cm2
AV Area mean vel: 1.03 cm2
AV Mean grad: 3 mm[Hg]
AV Peak grad: 6 mm[Hg]
Ao pk vel: 1.22 m/s
Area-P 1/2: 3.39 cm2
Calc EF: 60.4 %
Height: 73 in
MV VTI: 1.87 cm2
S' Lateral: 2.6 cm
Single Plane A2C EF: 62.8 %
Single Plane A4C EF: 58.7 %
Weight: 2786.61 [oz_av]

## 2023-04-24 LAB — CBC WITH DIFFERENTIAL/PLATELET
Abs Immature Granulocytes: 0.1 10*3/uL — ABNORMAL HIGH (ref 0.00–0.07)
Basophils Absolute: 0 10*3/uL (ref 0.0–0.1)
Basophils Relative: 0 %
Eosinophils Absolute: 0.4 10*3/uL (ref 0.0–0.5)
Eosinophils Relative: 3 %
HCT: 24.3 % — ABNORMAL LOW (ref 39.0–52.0)
Hemoglobin: 7.7 g/dL — ABNORMAL LOW (ref 13.0–17.0)
Immature Granulocytes: 1 %
Lymphocytes Relative: 27 %
Lymphs Abs: 3.8 10*3/uL (ref 0.7–4.0)
MCH: 27.5 pg (ref 26.0–34.0)
MCHC: 31.7 g/dL (ref 30.0–36.0)
MCV: 86.8 fL (ref 80.0–100.0)
Monocytes Absolute: 1.1 10*3/uL — ABNORMAL HIGH (ref 0.1–1.0)
Monocytes Relative: 8 %
Neutro Abs: 8.6 10*3/uL — ABNORMAL HIGH (ref 1.7–7.7)
Neutrophils Relative %: 61 %
Platelets: 570 10*3/uL — ABNORMAL HIGH (ref 150–400)
RBC: 2.8 MIL/uL — ABNORMAL LOW (ref 4.22–5.81)
RDW: 15.2 % (ref 11.5–15.5)
WBC: 14 10*3/uL — ABNORMAL HIGH (ref 4.0–10.5)
nRBC: 0 % (ref 0.0–0.2)

## 2023-04-24 LAB — PROTIME-INR
INR: 2.1 — ABNORMAL HIGH (ref 0.8–1.2)
Prothrombin Time: 24.1 s — ABNORMAL HIGH (ref 11.4–15.2)

## 2023-04-24 LAB — BASIC METABOLIC PANEL
Anion gap: 9 (ref 5–15)
BUN: 9 mg/dL (ref 8–23)
CO2: 23 mmol/L (ref 22–32)
Calcium: 8.2 mg/dL — ABNORMAL LOW (ref 8.9–10.3)
Chloride: 105 mmol/L (ref 98–111)
Creatinine, Ser: 0.63 mg/dL (ref 0.61–1.24)
GFR, Estimated: >60 mL/min (ref >60)
Glucose, Bld: 114 mg/dL — ABNORMAL HIGH (ref 70–99)
Potassium: 3.1 mmol/L — ABNORMAL LOW (ref 3.5–5.1)
Sodium: 137 mmol/L (ref 135–145)

## 2023-04-24 MED ORDER — POTASSIUM CHLORIDE CRYS ER 20 MEQ PO TBCR
40.0000 meq | EXTENDED_RELEASE_TABLET | ORAL | Status: AC
Start: 2023-04-24 — End: 2023-04-24
  Administered 2023-04-24 (×2): 40 meq via ORAL
  Filled 2023-04-24 (×2): qty 2

## 2023-04-24 MED ORDER — INSULIN ASPART 100 UNIT/ML IJ SOLN
0.0000 [IU] | Freq: Three times a day (TID) | INTRAMUSCULAR | Status: DC
  Administered 2023-04-26 – 2023-05-01 (×4): 1 [IU] via SUBCUTANEOUS

## 2023-04-24 MED ORDER — MAGNESIUM SULFATE 4 GM/100ML IV SOLN
4.0000 g | Freq: Once | INTRAVENOUS | Status: AC
  Administered 2023-04-24: 4 g via INTRAVENOUS
  Filled 2023-04-24: qty 100

## 2023-04-24 MED ORDER — WARFARIN SODIUM 6 MG PO TABS
6.0000 mg | ORAL_TABLET | ORAL | Status: AC
  Administered 2023-04-24: 6 mg via ORAL
  Filled 2023-04-24: qty 1

## 2023-04-24 NOTE — Progress Notes (Signed)
  Echocardiogram 2D Echocardiogram has been performed.  Ocie Doyne RDCS 04/24/2023, 9:53 AM

## 2023-04-24 NOTE — Plan of Care (Signed)

## 2023-04-24 NOTE — Progress Notes (Signed)
PHARMACY - ANTICOAGULATION CONSULT NOTE  Pharmacy Consult for Coumadin Indication: atrial fibrillation  Allergies  Allergen Reactions   Lisinopril Cough    Patient Measurements: Height: 6\' 1"  (185.4 cm) Weight: 79 kg (174 lb 2.6 oz) IBW/kg (Calculated) : 79.9  Vital Signs: Temp: 98.5 F (36.9 C) (01/30 0419) Temp Source: Oral (01/30 0419) BP: 137/73 (01/30 0419) Pulse Rate: 77 (01/30 0419)  Labs: Recent Labs    04/21/23 1046 04/21/23 1105 04/22/23 1512 04/22/23 1513 04/22/23 1649 04/22/23 1950 04/23/23 0611 04/23/23 1143 04/24/23 0552  HGB  --    < > 8.9*  --   --   --  8.3*  --  7.7*  HCT  --    < > 28.7*  --   --   --  25.8*  --  24.3*  PLT  --    < > 659*  --   --   --  607*  --  570*  LABPROT 65.8*   < >  --  17.4*  --   --   --  21.9* 24.1*  INR 7.8*   < >  --  1.4*  --   --   --  1.9* 2.1*  CREATININE  --    < > 0.56*  --   --   --  0.33*  --  0.63  TROPONINIHS 7  --   --   --  5 5  --   --   --    < > = values in this interval not displayed.    Estimated Creatinine Clearance: 72.7 mL/min (by C-G formula based on SCr of 0.63 mg/dL).   Medical History: Past Medical History:  Diagnosis Date   A-fib [facility])    Acute alcoholic pancreatitis 07/24/2019   Diverticulitis 11/13/2021   Heavy alcohol use 07/24/2019   Hematuria 11/14/2021   Hypertension    TIA (transient ischemic attack)     Assessment: AC/Heme: Coumadin PTA for afib - Hgb 8.3>7.7, Plts elevated  - 1/27:  INR is 7.8,  Vit K 5mg  IV x 1 given in ED.  Hgb 11.9 to 9.0 - 1/28: INR 1.5, Hgb down to 8.4.  Warfarin 10mg   - 1/29: INR 1.9, Coumadin 5mg - NOT CHARTED (SZP) - 1/30 INR 2.1  Goal of Therapy:  INR 2-3 Monitor platelets by anticoagulation protocol: Yes   Plan:  Coumadin 6mg  po x 1 today Daily INR   Pariss Hommes S. Merilynn Finland, PharmD, BCPS Clinical Staff Pharmacist Misty Stanley Stillinger 04/24/2023,10:05 AM

## 2023-04-24 NOTE — Progress Notes (Signed)
EEG complete - results pending

## 2023-04-24 NOTE — Progress Notes (Signed)
   04/24/23 1427  TOC Brief Assessment  Insurance and Status Reviewed  Patient has primary care physician Yes  Home environment has been reviewed Single family home  Prior level of function: Independent  Prior/Current Home Services No current home services  Social Drivers of Health Review SDOH reviewed no interventions necessary  Readmission risk has been reviewed Yes  Transition of care needs transition of care needs identified, TOC will continue to follow

## 2023-04-24 NOTE — Procedures (Signed)
Patient Name: Joandy Burget  MRN: [id number]  Epilepsy Attending: Charlsie Quest  Referring Physician/Provider: Fran Lowes, DO  Date: 04/24/2023 Duration: 25.22 mins  Patient history: 89yo M with syncope. EEG to evaluate for seizure  Level of alertness: Awake, asleep  AEDs during EEG study: None  Technical aspects: This EEG study was done with scalp electrodes positioned according to the 10-20 International system of electrode placement. Electrical activity was reviewed with band pass filter of 1-70Hz , sensitivity of 7 uV/mm, display speed of 36mm/sec with a 60Hz  notched filter applied as appropriate. EEG data were recorded continuously and digitally stored.  Video monitoring was available and reviewed as appropriate.  Description: The posterior dominant rhythm consists of 8 Hz activity of moderate voltage (25-35 uV) seen predominantly in posterior head regions, symmetric and reactive to eye opening and eye closing. Sleep was characterized by vertex waves, maximal frontocentral region. Hyperventilation and photic stimulation were not performed.     IMPRESSION: This study is within normal limits. No seizures or epileptiform discharges were seen throughout the recording.  A normal interictal EEG does not exclude the diagnosis of epilepsy.  Tiffny Gemmer Annabelle Harman

## 2023-04-25 DIAGNOSIS — R55 Syncope and collapse: Secondary | ICD-10-CM | POA: Diagnosis not present

## 2023-04-25 LAB — CBC WITH DIFFERENTIAL/PLATELET
Abs Immature Granulocytes: 0.06 10*3/uL (ref 0.00–0.07)
Basophils Absolute: 0.1 10*3/uL (ref 0.0–0.1)
Basophils Relative: 0 %
Eosinophils Absolute: 0.4 10*3/uL (ref 0.0–0.5)
Eosinophils Relative: 3 %
HCT: 25.3 % — ABNORMAL LOW (ref 39.0–52.0)
Hemoglobin: 7.9 g/dL — ABNORMAL LOW (ref 13.0–17.0)
Immature Granulocytes: 0 %
Lymphocytes Relative: 23 %
Lymphs Abs: 3.3 10*3/uL (ref 0.7–4.0)
MCH: 27.7 pg (ref 26.0–34.0)
MCHC: 31.2 g/dL (ref 30.0–36.0)
MCV: 88.8 fL (ref 80.0–100.0)
Monocytes Absolute: 1.2 10*3/uL — ABNORMAL HIGH (ref 0.1–1.0)
Monocytes Relative: 8 %
Neutro Abs: 9.2 10*3/uL — ABNORMAL HIGH (ref 1.7–7.7)
Neutrophils Relative %: 66 %
Platelets: 602 10*3/uL — ABNORMAL HIGH (ref 150–400)
RBC: 2.85 MIL/uL — ABNORMAL LOW (ref 4.22–5.81)
RDW: 15.8 % — ABNORMAL HIGH (ref 11.5–15.5)
WBC: 14.1 10*3/uL — ABNORMAL HIGH (ref 4.0–10.5)
nRBC: 0 % (ref 0.0–0.2)

## 2023-04-25 LAB — BASIC METABOLIC PANEL
Anion gap: 8 (ref 5–15)
BUN: 11 mg/dL (ref 8–23)
CO2: 22 mmol/L (ref 22–32)
Calcium: 8.4 mg/dL — ABNORMAL LOW (ref 8.9–10.3)
Chloride: 111 mmol/L (ref 98–111)
Creatinine, Ser: 0.63 mg/dL (ref 0.61–1.24)
GFR, Estimated: >60 mL/min (ref >60)
Glucose, Bld: 111 mg/dL — ABNORMAL HIGH (ref 70–99)
Potassium: 4.1 mmol/L (ref 3.5–5.1)
Sodium: 141 mmol/L (ref 135–145)

## 2023-04-25 LAB — GLUCOSE, CAPILLARY
Glucose-Capillary: 107 mg/dL — ABNORMAL HIGH (ref 70–99)
Glucose-Capillary: 136 mg/dL — ABNORMAL HIGH (ref 70–99)
Glucose-Capillary: 131 mg/dL — ABNORMAL HIGH (ref 70–99)
Glucose-Capillary: 151 mg/dL — ABNORMAL HIGH (ref 70–99)

## 2023-04-25 LAB — CULTURE, BLOOD (ROUTINE X 2)

## 2023-04-25 LAB — PROTIME-INR
INR: 2.7 — ABNORMAL HIGH (ref 0.8–1.2)
Prothrombin Time: 28.8 s — ABNORMAL HIGH (ref 11.4–15.2)

## 2023-04-25 MED ORDER — THIAMINE MONONITRATE 100 MG PO TABS
100.0000 mg | ORAL_TABLET | Freq: Every day | ORAL | Status: DC
  Administered 2023-04-25 – 2023-05-01 (×7): 100 mg via ORAL
  Filled 2023-04-25 (×7): qty 1

## 2023-04-25 MED ORDER — VANCOMYCIN HCL 1750 MG/350ML IV SOLN
1750.0000 mg | INTRAVENOUS | Status: DC
  Administered 2023-04-25 – 2023-04-27 (×3): 1750 mg via INTRAVENOUS
  Filled 2023-04-25 (×4): qty 350

## 2023-04-25 MED ORDER — WARFARIN SODIUM 1 MG PO TABS
1.0000 mg | ORAL_TABLET | Freq: Once | ORAL | Status: AC
  Administered 2023-04-25: 1 mg via ORAL
  Filled 2023-04-25: qty 1

## 2023-04-25 NOTE — Progress Notes (Incomplete Revision)
PROGRESS NOTE  Christian Crawford QMV:[id number] DOB: 1933/11/17 DOA: 04/22/2023 PCP: Ellyn Hack, MD  Brief History   The patient is a 89 yr old man who presented to [facility] ED on 04/22/2023 with complaints of syncope. The patient had been discharged to home earlier in the day after a brief admission for a supertherapeutic INR. He was on coumadin for atrial fibrillation, and presented to a spontaneous left chest wall hematoma. It was reversed with vitamin K. The INR was reduced to 1.5, and the patient was discharged on Lovenox. He stopped at a convenience store on his way home, got dizzy and passed out in the store. He did not strike his head. Exam in the ED was nonfocal. The patient did answer questions appropriately. Earlier this month the patient had undergone an MRCP and had visited GI which demonstrated an abnormal gallbladder up to 4.3 cm with circumferential wall thickening, also dilatation of the pancreatic duct and abnormal MRCP. Abdominal ultrasound on 04/22/2023 demonstrated no evidence of acute cholecystitis. CTA of the chest and abdomen demonstrated acute pancreatitis with possibly developing pseudocyst.  GI was consulted and determined that no ERCP was necessary. Recommendation is that the patient follow up with [facility] GI as previously planned as outpatient. EEG and echocardiogram has been ordered.  Pharmacy has been consulted to assist with doseing Vancomycin. Repeat blood cultures have been drawn.  A & P  Syncope and collapse The patient has been admitted to a telemetry bed. He will also have an EEG and a echocargiogram. The patient did meet criteria for sepsis upon presentation. He was tachycardic, RR elevated to 23, and there was a leukocytosis of 17.8 WBC. The 17.8 was actually decreased from 21.5 on 04/22/2023. There is a putative source for sepsis in the patient's CTA chest abdomen and pelvis with acute on chronic pancreatitis and possible pseudocyst and possible cholecystitis. The  patient has been discussed with gasgroenterology and general surgery. Neither service sees a need for intervention of further investigation at this time.  Staph bacteremia Echocardiogram without evidence of vegetations. The patient was recently inpatient and is at risk for MSSA or MRSA bacteremia. This would also explain the patient's presentation meeting sepsis criteria. Will change to vancomycin.  Acute pancreatitis Mild, CT abdomen shows acute on chronic pancreatitis suspected may be just starting. Lipid panel pending. No need for ERCP per GI. Recommendation is for follow up with [facility] GI as outpatient.  Abnormal magnetic resonance cholangiopancreatography (MRCP) Patient is being referred to [facility] GI for evaluation of stricture or mass.  See report below: IMPRESSION: 1. Dilatation of the pancreatic duct measuring up to 0.7 cm, with abrupt caliber change in the pancreatic head neck junction. Diffusely atrophic pancreatic parenchyma distal to this point. Generally normal appearance of the pancreatic head. This appearance has evolved over prior examinations dating back to 07/24/2019 and most likely reflects chronic sequelae of pancreatitis, without discretely visible mass. However, given motion limitations on today's examination consider ERCP to further evaluate for ductal stricture or occult mass. 2. 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 This requires no specific further follow-up given small size and advanced patient age. 3. No acute inflammatory findings in the abdomen at this time. The patient was discussed with GI and General surgery. It is felt that these changes are chronic. No need for intervention. They patient is urged to follow up with his GI in [facility].  Aortic Atherosclerosis (ICD10-I70.0). Noted.   Leucocytosis Suspect secondary to biliary etiology and/or  bacteremia. Pt was empirically started on zosyn. Will transition to augment for PO option for discharge. WBC has decreased from 17.8 to 14.1 Continue to monitor.   Benign essential hypertension Patient continued on metoprolol, losartan, amlodipine and hydralazine.   Paroxysmal atrial fibrillation (HCC) Currently in sinus rhythm. Patient continued on Coumadin per pharmacy protocol.  Anemia Patient is hemoglobin has been trending down from 11.9-8.9 today CT angio chest abdomen and pelvis is negative.  Chronic blood loss through the gut. -Type and screen, IV PPI, stool guaiac and follow.   Elevated INR (Resolved: 04/22/2023) INR/Prothrombin Time subtherapeutic will consult pharmacy for Coumadin management for paroxysmal atrial fibrillation.   Sliding scale insulin regimen.   DVT prophylaxis:  Coumadin  Consults:  None  Advance Care Planning:    Code Status: Prior    Family Communication:  None  Disposition Plan:  TBD   I have seen and examined this patient myself. I have spent 32 minutes in her evaluation and care.  Adajah Cocking, DO [facility] Direct contact: see [email or web address]  7PM-7AM contact night coverage as above 04/25/2023, 5:13 PM  LOS: 2 days   Consultants  Gastroenterology General surgery  Procedures  EEG ordered  Antibiotics   Anti-infectives (From admission, onward)    Start     Dose/Rate Route Frequency Ordered Stop   04/25/23 1400  vancomycin (VANCOREADY) IVPB 1750 mg/350 mL        1,750 mg 175 mL/hr over 120 Minutes Intravenous Every 24 hours 04/25/23 1310     04/22/23 2330  piperacillin-tazobactam (ZOSYN) IVPB 3.375 g        3.375 g 12.5 mL/hr over 240 Minutes Intravenous Every 8 hours 04/22/23 2310          Interval History/Subjective  The patient is resting comfortably. No new complaints.  Objective   Vitals:  Vitals:   04/25/23 0844 04/25/23 1244  BP: 133/65 (!) 142/64  Pulse: 78 78  Resp:  (!) 25  Temp:  98 F (36.7 C)   SpO2:  100%    Exam:  Constitutional:  The patient is awake, alert, and oriented x 3. No acute distress. Respiratory:  No increased work of breathing. No wheezes, rales, or rhonchi No tactile fremitus Cardiovascular:  Regular rate and rhythm No murmurs, ectopy, or gallups. No lateral PMI. No thrills. Abdomen:  Abdomen is soft, non-tender, non-distended No hernias, masses, or organomegaly Normoactive bowel sounds.  Musculoskeletal:  No cyanosis, clubbing, or edema Skin:  No rashes, lesions, ulcers palpation of skin: no induration or nodules Neurologic:  CN 2-12 intact Sensation all 4 extremities intact Psychiatric:  Mental status Mood, affect appropriate Orientation to person, place, time  judgment and insight appear intact  I have personally reviewed the following:   Today's Data   Vitals:   04/25/23 0844 04/25/23 1244  BP: 133/65 (!) 142/64  Pulse: 78 78  Resp:  (!) 25  Temp:  98 F (36.7 C)  SpO2:  100%     Lab Data  CBC    Component Value Date/Time   WBC 14.1 (H) 04/25/2023 0920   RBC 2.85 (L) 04/25/2023 0920   HGB 7.9 (L) 04/25/2023 0920   HCT 25.3 (L) 04/25/2023 0920   PLT 602 (H) 04/25/2023 0920   MCV 88.8 04/25/2023 0920   MCH 27.7 04/25/2023 0920   MCHC 31.2 04/25/2023 0920   RDW 15.8 (H) 04/25/2023 0920   LYMPHSABS 3.3 04/25/2023  0920   MONOABS 1.2 (H) 04/25/2023 0920   EOSABS 0.4 04/25/2023 0920   BASOSABS 0.1 04/25/2023 0920      Latest Ref Rng & Units 04/25/2023    9:20 AM 04/24/2023    5:52 AM 04/23/2023    6:11 AM  BMP  Glucose 70 - 99 mg/dL 098  119  147   BUN 8 - 23 mg/dL 11  9  10    Creatinine 0.61 - 1.24 mg/dL 8.29  5.62  1.30   Sodium 135 - 145 mmol/L 141  137  137   Potassium 3.5 - 5.1 mmol/L 4.1  3.1  3.5   Chloride 98 - 111 mmol/L 111  105  103   CO2 22 - 32 mmol/L 22  23  22    Calcium 8.9 - 10.3 mg/dL 8.4  8.2  8.6      Micro Data   Results for orders placed or performed during the hospital encounter of  04/22/23  Culture, blood (Routine X 2) w Reflex to ID Panel     Status: None (Preliminary result)   Collection Time: 04/22/23  9:53 PM   Specimen: BLOOD RIGHT FOREARM  Result Value Ref Range Status   Specimen Description   Final    BLOOD RIGHT FOREARM Performed at [facility], [address], [place], [place] [address]    Special Requests   Final    BOTTLES DRAWN AEROBIC AND ANAEROBIC Blood Culture results may not be optimal due to an inadequate volume of blood received in culture bottles Performed at [facility], [address], [place], [place] [address]    Culture  Setup Time   Final    GRAM POSITIVE COCCI IN CHAINS AEROBIC BOTTLE ONLY CRITICAL RESULT CALLED TO, READ BACK BY AND VERIFIED WITH: Abe People [id number] @ 2120 FH     Culture   Final    GRAM POSITIVE COCCI IDENTIFICATION AND SUSCEPTIBILITIES TO FOLLOW Performed at [facility], [address] [place], [place] [address]    Report Status PENDING  Incomplete  Blood Culture ID Panel (Reflexed)     Status: Abnormal   Collection Time: 04/22/23  9:53 PM  Result Value Ref Range Status   Enterococcus faecalis NOT DETECTED NOT DETECTED Final   Enterococcus Faecium NOT DETECTED NOT DETECTED Final   Listeria monocytogenes NOT DETECTED NOT DETECTED Final   Staphylococcus species NOT DETECTED NOT DETECTED Final   Staphylococcus aureus (BCID) NOT DETECTED NOT DETECTED Final   Staphylococcus epidermidis NOT DETECTED NOT DETECTED Final   Staphylococcus lugdunensis NOT DETECTED NOT DETECTED Final   Streptococcus species DETECTED (A) NOT DETECTED Final    Comment: Not Enterococcus species, Streptococcus agalactiae, Streptococcus pyogenes, or Streptococcus pneumoniae. CRITICAL RESULT CALLED TO, READ BACK BY AND VERIFIED WITH: Abe People [id number] @ 2120 FH     Streptococcus agalactiae NOT DETECTED NOT DETECTED Final   Streptococcus pneumoniae NOT DETECTED NOT DETECTED Final   Streptococcus  pyogenes NOT DETECTED NOT DETECTED Final   A.calcoaceticus-baumannii NOT DETECTED NOT DETECTED Final   Bacteroides fragilis NOT DETECTED NOT DETECTED Final   Enterobacterales NOT DETECTED NOT DETECTED Final   Enterobacter cloacae complex NOT DETECTED NOT DETECTED Final   Escherichia coli NOT DETECTED NOT DETECTED Final   Klebsiella aerogenes NOT DETECTED NOT DETECTED Final   Klebsiella oxytoca NOT DETECTED NOT DETECTED Final   Klebsiella pneumoniae NOT DETECTED NOT DETECTED Final   Proteus species NOT DETECTED NOT DETECTED Final   Salmonella  species NOT DETECTED NOT DETECTED Final   Serratia marcescens NOT DETECTED NOT DETECTED Final   Haemophilus influenzae NOT DETECTED NOT DETECTED Final   Neisseria meningitidis NOT DETECTED NOT DETECTED Final   Pseudomonas aeruginosa NOT DETECTED NOT DETECTED Final   Stenotrophomonas maltophilia NOT DETECTED NOT DETECTED Final   Candida albicans NOT DETECTED NOT DETECTED Final   Candida auris NOT DETECTED NOT DETECTED Final   Candida glabrata NOT DETECTED NOT DETECTED Final   Candida krusei NOT DETECTED NOT DETECTED Final   Candida parapsilosis NOT DETECTED NOT DETECTED Final   Candida tropicalis NOT DETECTED NOT DETECTED Final   Cryptococcus neoformans/gattii NOT DETECTED NOT DETECTED Final    Comment: Performed at [facility], [address], [place], [place] [address]  Culture, blood (Routine X 2) w Reflex to ID Panel     Status: Abnormal   Collection Time: 04/22/23  9:54 PM   Specimen: BLOOD LEFT FOREARM  Result Value Ref Range Status   Specimen Description   Final    BLOOD LEFT FOREARM Performed at [facility], [address] [place], [place] [address]    Special Requests   Final    BOTTLES DRAWN AEROBIC ONLY Blood Culture results may not be optimal due to an inadequate volume of blood received in culture bottles Performed at [facility], [address] [place], [place] [address]    Culture  Setup Time    Final    GRAM POSITIVE COCCI IN CLUSTERS AEROBIC BOTTLE ONLY CRITICAL RESULT CALLED TO, READ BACK BY AND VERIFIED WITH: PHARMD NCarin Hock [id number] @ 2120 FH     Culture (A)  Final    STAPHYLOCOCCUS HAEMOLYTICUS THE SIGNIFICANCE OF ISOLATING THIS ORGANISM FROM A SINGLE SET OF BLOOD CULTURES WHEN MULTIPLE SETS ARE DRAWN IS UNCERTAIN. PLEASE NOTIFY THE MICROBIOLOGY DEPARTMENT WITHIN ONE WEEK IF SPECIATION AND SENSITIVITIES ARE REQUIRED. Performed at [facility], [address], [place], [place] [address]    Report Status 04/25/2023 FINAL  Final  Respiratory (~20 pathogens) panel by PCR     Status: None   Collection Time: 04/22/23 11:00 PM   Specimen: Nasopharyngeal Swab; Respiratory  Result Value Ref Range Status   Adenovirus NOT DETECTED NOT DETECTED Final   Coronavirus 229E NOT DETECTED NOT DETECTED Final    Comment: (NOTE) The Coronavirus on the Respiratory Panel, DOES NOT test for the novel  Coronavirus (2019 nCoV)    Coronavirus HKU1 NOT DETECTED NOT DETECTED Final   Coronavirus NL63 NOT DETECTED NOT DETECTED Final   Coronavirus OC43 NOT DETECTED NOT DETECTED Final   Metapneumovirus NOT DETECTED NOT DETECTED Final   Rhinovirus / Enterovirus NOT DETECTED NOT DETECTED Final   Influenza A NOT DETECTED NOT DETECTED Final   Influenza B NOT DETECTED NOT DETECTED Final   Parainfluenza Virus 1 NOT DETECTED NOT DETECTED Final   Parainfluenza Virus 2 NOT DETECTED NOT DETECTED Final   Parainfluenza Virus 3 NOT DETECTED NOT DETECTED Final   Parainfluenza Virus 4 NOT DETECTED NOT DETECTED Final   Respiratory Syncytial Virus NOT DETECTED NOT DETECTED Final   Bordetella pertussis NOT DETECTED NOT DETECTED Final   Bordetella Parapertussis NOT DETECTED NOT DETECTED Final   Chlamydophila pneumoniae NOT DETECTED NOT DETECTED Final   Mycoplasma pneumoniae NOT DETECTED NOT DETECTED Final    Comment: Performed at [facility], [address] [place], [place] [address]  Blood Culture ID  Panel (Reflexed)     Status: Abnormal  Collection Time: 04/23/23  9:46 AM  Result Value Ref Range Status   Enterococcus faecalis NOT DETECTED NOT DETECTED Final   Enterococcus Faecium NOT DETECTED NOT DETECTED Final   Listeria monocytogenes NOT DETECTED NOT DETECTED Final   Staphylococcus species DETECTED (A) NOT DETECTED Final    Comment: CRITICAL RESULT CALLED TO, READ BACK BY AND VERIFIED WITH: Abe People [id number] @ 2120 FH     Staphylococcus aureus (BCID) NOT DETECTED NOT DETECTED Final   Staphylococcus epidermidis NOT DETECTED NOT DETECTED Final   Staphylococcus lugdunensis NOT DETECTED NOT DETECTED Final   Streptococcus species NOT DETECTED NOT DETECTED Final   Streptococcus agalactiae NOT DETECTED NOT DETECTED Final   Streptococcus pneumoniae NOT DETECTED NOT DETECTED Final   Streptococcus pyogenes NOT DETECTED NOT DETECTED Final   A.calcoaceticus-baumannii NOT DETECTED NOT DETECTED Final   Bacteroides fragilis NOT DETECTED NOT DETECTED Final   Enterobacterales NOT DETECTED NOT DETECTED Final   Enterobacter cloacae complex NOT DETECTED NOT DETECTED Final   Escherichia coli NOT DETECTED NOT DETECTED Final   Klebsiella aerogenes NOT DETECTED NOT DETECTED Final   Klebsiella oxytoca NOT DETECTED NOT DETECTED Final   Klebsiella pneumoniae NOT DETECTED NOT DETECTED Final   Proteus species NOT DETECTED NOT DETECTED Final   Salmonella species NOT DETECTED NOT DETECTED Final   Serratia marcescens NOT DETECTED NOT DETECTED Final   Haemophilus influenzae NOT DETECTED NOT DETECTED Final   Neisseria meningitidis NOT DETECTED NOT DETECTED Final   Pseudomonas aeruginosa NOT DETECTED NOT DETECTED Final   Stenotrophomonas maltophilia NOT DETECTED NOT DETECTED Final   Candida albicans NOT DETECTED NOT DETECTED Final   Candida auris NOT DETECTED NOT DETECTED Final   Candida glabrata NOT DETECTED NOT DETECTED Final   Candida krusei NOT DETECTED NOT DETECTED Final   Candida parapsilosis  NOT DETECTED NOT DETECTED Final   Candida tropicalis NOT DETECTED NOT DETECTED Final   Cryptococcus neoformans/gattii NOT DETECTED NOT DETECTED Final    Comment: Performed at [facility], [address] [place], [place] [address]   Imaging  CTA chest abdomen and pelvis.  Cardiology Data  EKG  Scheduled Meds:  amLODipine  10 mg Oral Daily   insulin aspart  0-6 Units Subcutaneous TID WC   losartan  25 mg Oral Daily   metoprolol tartrate  75 mg Oral BID   thiamine  100 mg Oral Daily   Warfarin - Pharmacist Dosing Inpatient   Does not apply q1600   Continuous Infusions:  piperacillin-tazobactam (ZOSYN)  IV 3.375 g (04/25/23 1553)   vancomycin 1,750 mg (04/25/23 1541)    Principal Problem:   Syncope and collapse Active Problems:   Acute pancreatitis   Abnormal magnetic resonance cholangiopancreatography (MRCP)   Leucocytosis   Benign essential hypertension   Paroxysmal atrial fibrillation (HCC)   Anemia   Abnormal CT of the abdomen   Idiopathic chronic pancreatitis (HCC)   LOS: 2 days

## 2023-04-25 NOTE — Progress Notes (Signed)
Pharmacy Antibiotic Note  Abe Schools is a 89 y.o. male admitted on 04/22/2023 with  bacteremia .  Pharmacy has been consulted for Vanco dosing.  ID: r/o sepsis, possible biliary source - WBC 14 down, Afebrile  1/28 Zosyn >>  1/31 Vanco>>  1/28 Resp panel: none 1/28 BCx: x 2: - #1: Staph Haemolyticus in aerobic bottle only - #2: GPC chains in aerobic bottle only  1/29: BCID: Staph in one BC and Strep in another [facility]  Plan: add Vancomycin 1750 mg IV Q 24 hrs. Goal AUC 400-550. Expected AUC: 478 SCr used: 0.8    Height: 6\' 1"  (185.4 cm) Weight: 79 kg (174 lb 2.6 oz) IBW/kg (Calculated) : 79.9  Temp (24hrs), Avg:98.2 F (36.8 C), Min:98 F (36.7 C), Max:98.4 F (36.9 C)  Recent Labs  Lab 04/22/23 0406 04/22/23 1512 04/22/23 1707 04/23/23 0611 04/24/23 0552 04/25/23 0920  WBC 17.1* 21.5*  --  17.8* 14.0* 14.1*  CREATININE 0.58* 0.56*  --  0.33* 0.63 0.63  LATICACIDVEN  --   --  1.8  --   --   --     Estimated Creatinine Clearance: 71.3 mL/min (by C-G formula based on SCr of 0.63 mg/dL).    Allergies  Allergen Reactions   Lisinopril Cough    Kanija Remmel S. Merilynn Finland, PharmD, BCPS Clinical Staff Pharmacist Misty Stanley Stillinger 04/25/2023 1:16 PM

## 2023-04-25 NOTE — Progress Notes (Signed)
PHARMACY - ANTICOAGULATION CONSULT NOTE  Pharmacy Consult for Coumadin Indication: atrial fibrillation  Allergies  Allergen Reactions   Lisinopril Cough    Patient Measurements: Height: 6\' 1"  (185.4 cm) Weight: 79 kg (174 lb 2.6 oz) IBW/kg (Calculated) : 79.9  Vital Signs: Temp: 98.4 F (36.9 C) (01/31 0511) BP: 133/65 (01/31 0844) Pulse Rate: 78 (01/31 0844)  Labs: Recent Labs    04/22/23 1512 04/22/23 1513 04/22/23 1649 04/22/23 1950 04/23/23 0611 04/23/23 1143 04/24/23 0552 04/25/23 0523  HGB 8.9*  --   --   --  8.3*  --  7.7*  --   HCT 28.7*  --   --   --  25.8*  --  24.3*  --   PLT 659*  --   --   --  607*  --  570*  --   LABPROT  --    < >  --   --   --  21.9* 24.1* 28.8*  INR  --    < >  --   --   --  1.9* 2.1* 2.7*  CREATININE 0.56*  --   --   --  0.33*  --  0.63  --   TROPONINIHS  --   --  5 5  --   --   --   --    < > = values in this interval not displayed.    Estimated Creatinine Clearance: 71.3 mL/min (by C-G formula based on SCr of 0.63 mg/dL).   Medical History: Past Medical History:  Diagnosis Date   A-fib [facility])    Acute alcoholic pancreatitis 07/24/2019   Diverticulitis 11/13/2021   Heavy alcohol use 07/24/2019   Hematuria 11/14/2021   Hypertension    TIA (transient ischemic attack)     Assessment: AC/Heme: Coumadin PTA for afib - Hgb 8.3>7.7, Plts elevated  - 1/27:  INR is 7.8,  Vit K 5mg  IV x 1 given in ED.  Hgb 11.9 to 9.0 - 1/28: INR 1.5, Hgb down to 8.4.  Warfarin 10mg   - 1/29: INR 1.9, Coumadin 5mg  ordered- NOT CHARTED (SZP) - 1/30: INR 2.1, Coumadin 6mg  po x 1  - 1/31: INR 2.7 increasing quickly  Goal of Therapy:  INR 2-3 Monitor platelets by anticoagulation protocol: Yes   Plan:  Coumadin 1mg  po x 1 today Daily INR   Kym Fenter S. Merilynn Finland, PharmD, BCPS Clinical Staff Pharmacist Misty Stanley Stillinger 04/25/2023,9:00 AM

## 2023-04-25 NOTE — Progress Notes (Signed)
PROGRESS NOTE  Christian Crawford UJW:[id number] DOB: 1933/07/09 DOA: 04/22/2023 PCP: Ellyn Hack, MD  Brief History   The patient is a 89 yr old man who presented to [facility] ED on 04/22/2023 with complaints of syncope. The patient had been discharged to home earlier in the day after a brief admission for a supertherapeutic INR. He was on coumadin for atrial fibrillation, and presented to a spontaneous left chest wall hematoma. It was reversed with vitamin K. The INR was reduced to 1.5, and the patient was discharged on Lovenox. He stopped at a convenience store on his way home, got dizzy and passed out in the store. He did not strike his head. Exam in the ED was nonfocal. The patient did answer questions appropriately. Earlier this month the patient had undergone an MRCP and had visited GI which demonstrated an abnormal gallbladder up to 4.3 cm with circumferential wall thickening, also dilatation of the pancreatic duct and abnormal MRCP. Abdominal ultrasound on 04/22/2023 demonstrated no evidence of acute cholecystitis. CTA of the chest and abdomen demonstrated acute pancreatitis with possibly developing pseudocyst.  GI was consulted and determined that no ERCP was necessary. Recommendation is that the patient follow up with [facility] GI as previously planned as outpatient. EEG and echocardiogram has been ordered.  A & P  Syncope and collapse The patient has been admitted to a telemetry bed. He will also have an EEG and a echocargiogram. The patient did meet criteria for sepsis upon presentation. He was tachycardic, RR elevated to 23, and there was a leukocytosis of 17.8 WBC. The 17.8 was actually decreased from 21.5 on 04/22/2023. There is a putative source for sepsis in the patient's CTA chest abdomen and pelvis with acute on chronic pancreatitis and possible pseudocyst and possible cholecystitis.  Staph bacteremia Will repeat blood cultures tomorrow. Echocardiogram without evidence of vegetations. The  patient was recently inpatient and is at risk for MSSA or MRSA bacteremia. This would also explain the patient's presentation meeting sepsis criteria. Will change to vancomycin.  Acute pancreatitis Mild, CT abdomen shows acute on chronic pancreatitis suspected may be just starting. Lipid panel pending. No need for ERCP per GI. Recommendation is for follow up with [facility] GI as outpatient.  Abnormal magnetic resonance cholangiopancreatography (MRCP) Patient is being referred to [facility] for evaluation of stricture or mass.  See report below: IMPRESSION: 1. Dilatation of the pancreatic duct measuring up to 0.7 cm, with abrupt caliber change in the pancreatic head neck junction. Diffusely atrophic pancreatic parenchyma distal to this point. Generally normal appearance of the pancreatic head. This appearance has evolved over prior examinations dating back to 07/24/2019 and most likely reflects chronic sequelae of pancreatitis, without discretely visible mass. However, given motion limitations on today's examination consider ERCP to further evaluate for ductal stricture or occult mass. 2. 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 This requires no specific further follow-up given small size and advanced patient age. 3. No acute inflammatory findings in the abdomen at this time. The patient was discussed with GI and General surgery. It is felt that these changes are chronic. No need for intervention. They patient is urged to follow up with his GI in [facility].   Aortic Atherosclerosis (ICD10-I70.0). Noted.   Leucocytosis Suspect secondary to biliary etiology and/or bacteremia. Pt was empirically started on zosyn. Will transition to augment for PO option for discharge. WBC has decreased  from 17.8 to 14.1 Continue to monitor.   Benign essential hypertension Patient continued on  metoprolol, losartan, amlodipine and hydralazine.   Paroxysmal atrial fibrillation (HCC) Currently in sinus rhythm. Patient continued on Coumadin per pharmacy protocol.  Anemia Patient is hemoglobin has been trending down from 11.9-8.9 today CT angio chest abdomen and pelvis is negative.  Chronic blood loss through the gut. -Type and screen, IV PPI, stool guaiac and follow.   Elevated INR (Resolved: 04/22/2023) INR/Prothrombin Time subtherapeutic will consult pharmacy for Coumadin management for paroxysmal atrial fibrillation.   Sliding scale insulin regimen.   DVT prophylaxis:  Coumadin  Consults:  None  Advance Care Planning:    Code Status: Prior    Family Communication:  None  Disposition Plan:  TBD   I have seen and examined this patient myself. I have spent 32 minutes in her evaluation and care.  Brier Reid, DO [facility] Direct contact: see [email or web address]  7PM-7AM contact night coverage as above 04/25/2023, 2:16 PM  LOS: 2 days   Consultants  Gastroenterology General surgery  Procedures  EEG ordered  Antibiotics   Anti-infectives (From admission, onward)    Start     Dose/Rate Route Frequency Ordered Stop   04/25/23 1400  vancomycin (VANCOREADY) IVPB 1750 mg/350 mL        1,750 mg 175 mL/hr over 120 Minutes Intravenous Every 24 hours 04/25/23 1310     04/22/23 2330  piperacillin-tazobactam (ZOSYN) IVPB 3.375 g        3.375 g 12.5 mL/hr over 240 Minutes Intravenous Every 8 hours 04/22/23 2310          Interval History/Subjective  The patient is resting comfortably. No new complaints.  Objective   Vitals:  Vitals:   04/25/23 0844 04/25/23 1244  BP: 133/65 (!) 142/64  Pulse: 78 78  Resp:  (!) 25  Temp:  98 F (36.7 C)  SpO2:  100%    Exam:  Constitutional:  The patient is awake, alert, and oriented x 3. No acute distress. Respiratory:  No increased work of breathing. No wheezes, rales, or rhonchi No tactile  fremitus Cardiovascular:  Regular rate and rhythm No murmurs, ectopy, or gallups. No lateral PMI. No thrills. Abdomen:  Abdomen is soft, non-tender, non-distended No hernias, masses, or organomegaly Normoactive bowel sounds.  Musculoskeletal:  No cyanosis, clubbing, or edema Skin:  No rashes, lesions, ulcers palpation of skin: no induration or nodules Neurologic:  CN 2-12 intact Sensation all 4 extremities intact Psychiatric:  Mental status Mood, affect appropriate Orientation to person, place, time  judgment and insight appear intact  I have personally reviewed the following:   Today's Data   Vitals:   04/25/23 0844 04/25/23 1244  BP: 133/65 (!) 142/64  Pulse: 78 78  Resp:  (!) 25  Temp:  98 F (36.7 C)  SpO2:  100%     Lab Data  CBC    Component Value Date/Time   WBC 14.1 (H) 04/25/2023 0920   RBC 2.85 (L) 04/25/2023 0920   HGB 7.9 (L) 04/25/2023 0920   HCT 25.3 (L) 04/25/2023 0920   PLT 602 (H) 04/25/2023 0920   MCV 88.8 04/25/2023 0920   MCH 27.7 04/25/2023 0920   MCHC 31.2 04/25/2023 0920   RDW 15.8 (H) 04/25/2023 0920   LYMPHSABS 3.3 04/25/2023 0920   MONOABS 1.2 (H) 04/25/2023 0920   EOSABS 0.4 04/25/2023 0920   BASOSABS 0.1 04/25/2023 0920      Latest Ref Rng & Units 04/25/2023  9:20 AM 04/24/2023    5:52 AM 04/23/2023    6:11 AM  BMP  Glucose 70 - 99 mg/dL 409  811  914   BUN 8 - 23 mg/dL 11  9  10    Creatinine 0.61 - 1.24 mg/dL 7.82  9.56  2.13   Sodium 135 - 145 mmol/L 141  137  137   Potassium 3.5 - 5.1 mmol/L 4.1  3.1  3.5   Chloride 98 - 111 mmol/L 111  105  103   CO2 22 - 32 mmol/L 22  23  22    Calcium 8.9 - 10.3 mg/dL 8.4  8.2  8.6      Micro Data   Results for orders placed or performed during the hospital encounter of 04/22/23  Culture, blood (Routine X 2) w Reflex to ID Panel     Status: None (Preliminary result)   Collection Time: 04/22/23  9:53 PM   Specimen: BLOOD RIGHT FOREARM  Result Value Ref Range Status    Specimen Description   Final    BLOOD RIGHT FOREARM Performed at [facility], [address] [place], [place] [address]    Special Requests   Final    BOTTLES DRAWN AEROBIC AND ANAEROBIC Blood Culture results may not be optimal due to an inadequate volume of blood received in culture bottles Performed at [facility], [address], [place], [place] [address]    Culture  Setup Time   Final    GRAM POSITIVE COCCI IN CHAINS AEROBIC BOTTLE ONLY CRITICAL RESULT CALLED TO, READ BACK BY AND VERIFIED WITH: Abe People [id number] @ 2120 FH     Culture   Final    GRAM POSITIVE COCCI IDENTIFICATION AND SUSCEPTIBILITIES TO FOLLOW Performed at [facility], [address], [place], [place] [address]    Report Status PENDING  Incomplete  Blood Culture ID Panel (Reflexed)     Status: Abnormal   Collection Time: 04/22/23  9:53 PM  Result Value Ref Range Status   Enterococcus faecalis NOT DETECTED NOT DETECTED Final   Enterococcus Faecium NOT DETECTED NOT DETECTED Final   Listeria monocytogenes NOT DETECTED NOT DETECTED Final   Staphylococcus species NOT DETECTED NOT DETECTED Final   Staphylococcus aureus (BCID) NOT DETECTED NOT DETECTED Final   Staphylococcus epidermidis NOT DETECTED NOT DETECTED Final   Staphylococcus lugdunensis NOT DETECTED NOT DETECTED Final   Streptococcus species DETECTED (A) NOT DETECTED Final    Comment: Not Enterococcus species, Streptococcus agalactiae, Streptococcus pyogenes, or Streptococcus pneumoniae. CRITICAL RESULT CALLED TO, READ BACK BY AND VERIFIED WITH: Abe People [id number] @ 2120 FH     Streptococcus agalactiae NOT DETECTED NOT DETECTED Final   Streptococcus pneumoniae NOT DETECTED NOT DETECTED Final   Streptococcus pyogenes NOT DETECTED NOT DETECTED Final   A.calcoaceticus-baumannii NOT DETECTED NOT DETECTED Final   Bacteroides fragilis NOT DETECTED NOT DETECTED Final   Enterobacterales NOT DETECTED NOT DETECTED  Final   Enterobacter cloacae complex NOT DETECTED NOT DETECTED Final   Escherichia coli NOT DETECTED NOT DETECTED Final   Klebsiella aerogenes NOT DETECTED NOT DETECTED Final   Klebsiella oxytoca NOT DETECTED NOT DETECTED Final   Klebsiella pneumoniae NOT DETECTED NOT DETECTED Final   Proteus species NOT DETECTED NOT DETECTED Final   Salmonella species NOT DETECTED NOT DETECTED Final   Serratia marcescens NOT DETECTED NOT DETECTED Final   Haemophilus influenzae NOT DETECTED NOT DETECTED Final   Neisseria meningitidis NOT DETECTED NOT DETECTED Final  Pseudomonas aeruginosa NOT DETECTED NOT DETECTED Final   Stenotrophomonas maltophilia NOT DETECTED NOT DETECTED Final   Candida albicans NOT DETECTED NOT DETECTED Final   Candida auris NOT DETECTED NOT DETECTED Final   Candida glabrata NOT DETECTED NOT DETECTED Final   Candida krusei NOT DETECTED NOT DETECTED Final   Candida parapsilosis NOT DETECTED NOT DETECTED Final   Candida tropicalis NOT DETECTED NOT DETECTED Final   Cryptococcus neoformans/gattii NOT DETECTED NOT DETECTED Final    Comment: Performed at [facility], [address], [place], [place] [address]  Culture, blood (Routine X 2) w Reflex to ID Panel     Status: Abnormal   Collection Time: 04/22/23  9:54 PM   Specimen: BLOOD LEFT FOREARM  Result Value Ref Range Status   Specimen Description   Final    BLOOD LEFT FOREARM Performed at [facility], [address], [place], [place] [address]    Special Requests   Final    BOTTLES DRAWN AEROBIC ONLY Blood Culture results may not be optimal due to an inadequate volume of blood received in culture bottles Performed at [facility], [address], [place], [place] [address]    Culture  Setup Time   Final    GRAM POSITIVE COCCI IN CLUSTERS AEROBIC BOTTLE ONLY CRITICAL RESULT CALLED TO, READ BACK BY AND VERIFIED WITH: PHARMD NCarin Hock [id number] @ 2120 FH     Culture (A)  Final    STAPHYLOCOCCUS  HAEMOLYTICUS THE SIGNIFICANCE OF ISOLATING THIS ORGANISM FROM A SINGLE SET OF BLOOD CULTURES WHEN MULTIPLE SETS ARE DRAWN IS UNCERTAIN. PLEASE NOTIFY THE MICROBIOLOGY DEPARTMENT WITHIN ONE WEEK IF SPECIATION AND SENSITIVITIES ARE REQUIRED. Performed at [facility], [address] [place], [place] [address]    Report Status 04/25/2023 FINAL  Final  Respiratory (~20 pathogens) panel by PCR     Status: None   Collection Time: 04/22/23 11:00 PM   Specimen: Nasopharyngeal Swab; Respiratory  Result Value Ref Range Status   Adenovirus NOT DETECTED NOT DETECTED Final   Coronavirus 229E NOT DETECTED NOT DETECTED Final    Comment: (NOTE) The Coronavirus on the Respiratory Panel, DOES NOT test for the novel  Coronavirus (2019 nCoV)    Coronavirus HKU1 NOT DETECTED NOT DETECTED Final   Coronavirus NL63 NOT DETECTED NOT DETECTED Final   Coronavirus OC43 NOT DETECTED NOT DETECTED Final   Metapneumovirus NOT DETECTED NOT DETECTED Final   Rhinovirus / Enterovirus NOT DETECTED NOT DETECTED Final   Influenza A NOT DETECTED NOT DETECTED Final   Influenza B NOT DETECTED NOT DETECTED Final   Parainfluenza Virus 1 NOT DETECTED NOT DETECTED Final   Parainfluenza Virus 2 NOT DETECTED NOT DETECTED Final   Parainfluenza Virus 3 NOT DETECTED NOT DETECTED Final   Parainfluenza Virus 4 NOT DETECTED NOT DETECTED Final   Respiratory Syncytial Virus NOT DETECTED NOT DETECTED Final   Bordetella pertussis NOT DETECTED NOT DETECTED Final   Bordetella Parapertussis NOT DETECTED NOT DETECTED Final   Chlamydophila pneumoniae NOT DETECTED NOT DETECTED Final   Mycoplasma pneumoniae NOT DETECTED NOT DETECTED Final    Comment: Performed at [facility], [address], [place], [place] [address]  Blood Culture ID Panel (Reflexed)     Status: Abnormal   Collection Time: 04/23/23  9:46 AM  Result Value Ref Range Status   Enterococcus faecalis NOT DETECTED NOT DETECTED Final   Enterococcus Faecium NOT DETECTED  NOT DETECTED Final   Listeria monocytogenes NOT  DETECTED NOT DETECTED Final   Staphylococcus species DETECTED (A) NOT DETECTED Final    Comment: CRITICAL RESULT CALLED TO, READ BACK BY AND VERIFIED WITH: Abe People [id number] @ 2120 FH     Staphylococcus aureus (BCID) NOT DETECTED NOT DETECTED Final   Staphylococcus epidermidis NOT DETECTED NOT DETECTED Final   Staphylococcus lugdunensis NOT DETECTED NOT DETECTED Final   Streptococcus species NOT DETECTED NOT DETECTED Final   Streptococcus agalactiae NOT DETECTED NOT DETECTED Final   Streptococcus pneumoniae NOT DETECTED NOT DETECTED Final   Streptococcus pyogenes NOT DETECTED NOT DETECTED Final   A.calcoaceticus-baumannii NOT DETECTED NOT DETECTED Final   Bacteroides fragilis NOT DETECTED NOT DETECTED Final   Enterobacterales NOT DETECTED NOT DETECTED Final   Enterobacter cloacae complex NOT DETECTED NOT DETECTED Final   Escherichia coli NOT DETECTED NOT DETECTED Final   Klebsiella aerogenes NOT DETECTED NOT DETECTED Final   Klebsiella oxytoca NOT DETECTED NOT DETECTED Final   Klebsiella pneumoniae NOT DETECTED NOT DETECTED Final   Proteus species NOT DETECTED NOT DETECTED Final   Salmonella species NOT DETECTED NOT DETECTED Final   Serratia marcescens NOT DETECTED NOT DETECTED Final   Haemophilus influenzae NOT DETECTED NOT DETECTED Final   Neisseria meningitidis NOT DETECTED NOT DETECTED Final   Pseudomonas aeruginosa NOT DETECTED NOT DETECTED Final   Stenotrophomonas maltophilia NOT DETECTED NOT DETECTED Final   Candida albicans NOT DETECTED NOT DETECTED Final   Candida auris NOT DETECTED NOT DETECTED Final   Candida glabrata NOT DETECTED NOT DETECTED Final   Candida krusei NOT DETECTED NOT DETECTED Final   Candida parapsilosis NOT DETECTED NOT DETECTED Final   Candida tropicalis NOT DETECTED NOT DETECTED Final   Cryptococcus neoformans/gattii NOT DETECTED NOT DETECTED Final    Comment: Performed at [facility],  [address], [place], [place] [address]   Imaging  CTA chest abdomen and pelvis.  Cardiology Data  EKG  Scheduled Meds:  amLODipine  10 mg Oral Daily   insulin aspart  0-6 Units Subcutaneous TID WC   losartan  25 mg Oral Daily   metoprolol tartrate  75 mg Oral BID   thiamine  100 mg Oral Daily   warfarin  1 mg Oral ONCE-1600   Warfarin - Pharmacist Dosing Inpatient   Does not apply q1600   Continuous Infusions:  piperacillin-tazobactam (ZOSYN)  IV 3.375 g (04/25/23 0617)   vancomycin      Principal Problem:   Syncope and collapse Active Problems:   Acute pancreatitis   Abnormal magnetic resonance cholangiopancreatography (MRCP)   Leucocytosis   Benign essential hypertension   Paroxysmal atrial fibrillation (HCC)   Anemia   Abnormal CT of the abdomen   Idiopathic chronic pancreatitis (HCC)   LOS: 2 days

## 2023-04-25 NOTE — Plan of Care (Signed)
  Problem: Coping: Goal: Ability to adjust to condition or change in health will improve Outcome: Progressing   Problem: Nutritional: Goal: Maintenance of adequate nutrition will improve Outcome: Progressing   Problem: Nutrition: Goal: Adequate nutrition will be maintained Outcome: Progressing   Problem: Pain Managment: Goal: General experience of comfort will improve and/or be controlled Outcome: Progressing   Problem: Safety: Goal: Ability to remain free from injury will improve Outcome: Progressing

## 2023-04-25 NOTE — Progress Notes (Signed)
PROGRESS NOTE  [name] ION:[id number] DOB: [birth year] DOA: 04/22/2023 PCP: Ellyn Hack, MD  Brief History   The patient is a 89 yr old man who presented to [facility] ED on 04/22/2023 with complaints of syncope. The patient had been discharged to home earlier in the day after a brief admission for a supertherapeutic INR. He was on coumadin for atrial fibrillation, and presented to a spontaneous left chest wall hematoma. It was reversed with vitamin K. The INR was reduced to 1.5, and the patient was discharged on Lovenox. He stopped at a convenience store on his way home, got dizzy and passed out in the store. He did not strike his head. Exam in the ED was nonfocal. The patient did answer questions appropriately. Earlier this month the patient had undergone an MRCP and had visited GI which demonstrated an abnormal gallbladder up to 4.3 cm with circumferential wall thickening, also dilatation of the pancreatic duct and abnormal MRCP. Abdominal ultrasound on 04/22/2023 demonstrated no evidence of acute cholecystitis. CTA of the chest and abdomen demonstrated acute pancreatitis with possibly developing pseudocyst.  GI was consulted and determined that no ERCP was necessary. Recommendation is that the patient follow up with [facility] GI as previously planned as outpatient. EEG and echocardiogram has been ordered.  Pharmacy has been consulted to assist with doseing Vancomycin.   A & P  Syncope and collapse The patient has been admitted to a telemetry bed. He will also have an EEG and a echocargiogram. The patient did meet criteria for sepsis upon presentation. He was tachycardic, RR elevated to 23, and there was a leukocytosis of 17.8 WBC. The 17.8 was actually decreased from 21.5 on 04/22/2023. There is a putative source for sepsis in the patient's CTA chest abdomen and pelvis with acute on chronic pancreatitis and possible pseudocyst and possible cholecystitis.  Staph bacteremia Will repeat blood  cultures tomorrow. Echocardiogram without evidence of vegetations. The patient was recently inpatient and is at risk for MSSA or MRSA bacteremia. This would also explain the patient's presentation meeting sepsis criteria. Will change to vancomycin.  Acute pancreatitis Mild, CT abdomen shows acute on chronic pancreatitis suspected may be just starting. Lipid panel pending. No need for ERCP per GI. Recommendation is for follow up with [facility] GI as outpatient.  Abnormal magnetic resonance cholangiopancreatography (MRCP) Patient is being referred to [facility] GI for evaluation of stricture or mass.  See report below: IMPRESSION: 1. Dilatation of the pancreatic duct measuring up to 0.7 cm, with abrupt caliber change in the pancreatic head neck junction. Diffusely atrophic pancreatic parenchyma distal to this point. Generally normal appearance of the pancreatic head. This appearance has evolved over prior examinations dating back to 07/24/2019 and most likely reflects chronic sequelae of pancreatitis, without discretely visible mass. However, given motion limitations on today's examination consider ERCP to further evaluate for ductal stricture or occult mass. 2. 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 This requires no specific further follow-up given small size and advanced patient age. 3. No acute inflammatory findings in the abdomen at this time. The patient was discussed with GI and General surgery. It is felt that these changes are chronic. No need for intervention. They patient is urged to follow up with his GI in [facility].   Aortic Atherosclerosis (ICD10-I70.0). Noted.   Leucocytosis Suspect secondary to biliary etiology and/or bacteremia. Pt was empirically started on zosyn. Will  transition to augment for PO option for discharge. WBC has decreased from 17.8 to 14.1 Continue to  monitor.   Benign essential hypertension Patient continued on metoprolol, losartan, amlodipine and hydralazine.   Paroxysmal atrial fibrillation (HCC) Currently in sinus rhythm. Patient continued on Coumadin per pharmacy protocol.  Anemia Patient is hemoglobin has been trending down from 11.9-8.9 today CT angio chest abdomen and pelvis is negative.  Chronic blood loss through the gut. -Type and screen, IV PPI, stool guaiac and follow.   Elevated INR (Resolved: 04/22/2023) INR/Prothrombin Time subtherapeutic will consult pharmacy for Coumadin management for paroxysmal atrial fibrillation.   Sliding scale insulin regimen.   DVT prophylaxis:  Coumadin  Consults:  None  Advance Care Planning:    Code Status: Prior    Family Communication:  None  Disposition Plan:  TBD   I have seen and examined this patient myself. I have spent 32 minutes in her evaluation and care.  Yolinda Duerr, DO [facility] Direct contact: see [email or web address]  7PM-7AM contact night coverage as above 04/25/2023, 2:51 PM  LOS: 2 days   Consultants  Gastroenterology General surgery  Procedures  EEG ordered  Antibiotics   Anti-infectives (From admission, onward)    Start     Dose/Rate Route Frequency Ordered Stop   04/25/23 1400  vancomycin (VANCOREADY) IVPB 1750 mg/350 mL        1,750 mg 175 mL/hr over 120 Minutes Intravenous Every 24 hours 04/25/23 1310     04/22/23 2330  piperacillin-tazobactam (ZOSYN) IVPB 3.375 g        3.375 g 12.5 mL/hr over 240 Minutes Intravenous Every 8 hours 04/22/23 2310          Interval History/Subjective  The patient is resting comfortably. No new complaints.  Objective   Vitals:  Vitals:   04/25/23 0844 04/25/23 1244  BP: 133/65 (!) 142/64  Pulse: 78 78  Resp:  (!) 25  Temp:  98 F (36.7 C)  SpO2:  100%    Exam:  Constitutional:  The patient is awake, alert, and oriented x 3. No acute distress. Respiratory:  No increased work of  breathing. No wheezes, rales, or rhonchi No tactile fremitus Cardiovascular:  Regular rate and rhythm No murmurs, ectopy, or gallups. No lateral PMI. No thrills. Abdomen:  Abdomen is soft, non-tender, non-distended No hernias, masses, or organomegaly Normoactive bowel sounds.  Musculoskeletal:  No cyanosis, clubbing, or edema Skin:  No rashes, lesions, ulcers palpation of skin: no induration or nodules Neurologic:  CN 2-12 intact Sensation all 4 extremities intact Psychiatric:  Mental status Mood, affect appropriate Orientation to person, place, time  judgment and insight appear intact  I have personally reviewed the following:   Today's Data   Vitals:   04/25/23 0844 04/25/23 1244  BP: 133/65 (!) 142/64  Pulse: 78 78  Resp:  (!) 25  Temp:  98 F (36.7 C)  SpO2:  100%     Lab Data  CBC    Component Value Date/Time   WBC 14.1 (H) 04/25/2023 0920   RBC 2.85 (L) 04/25/2023 0920   HGB 7.9 (L) 04/25/2023 0920   HCT 25.3 (L) 04/25/2023 0920   PLT 602 (H) 04/25/2023 0920   MCV 88.8 04/25/2023 0920   MCH 27.7 04/25/2023 0920   MCHC 31.2 04/25/2023 0920   RDW 15.8 (H) 04/25/2023 0920   LYMPHSABS 3.3 04/25/2023 0920   MONOABS 1.2 (H) 04/25/2023 0920   EOSABS 0.4 04/25/2023 0920   BASOSABS 0.1 04/25/2023 0920  Latest Ref Rng & Units 04/25/2023    9:20 AM 04/24/2023    5:52 AM 04/23/2023    6:11 AM  BMP  Glucose 70 - 99 mg/dL 161  096  045   BUN 8 - 23 mg/dL 11  9  10    Creatinine 0.61 - 1.24 mg/dL 4.09  8.11  9.14   Sodium 135 - 145 mmol/L 141  137  137   Potassium 3.5 - 5.1 mmol/L 4.1  3.1  3.5   Chloride 98 - 111 mmol/L 111  105  103   CO2 22 - 32 mmol/L 22  23  22    Calcium 8.9 - 10.3 mg/dL 8.4  8.2  8.6      Micro Data   Results for orders placed or performed during the hospital encounter of 04/22/23  Culture, blood (Routine X 2) w Reflex to ID Panel     Status: None (Preliminary result)   Collection Time: 04/22/23  9:53 PM   Specimen: BLOOD  RIGHT FOREARM  Result Value Ref Range Status   Specimen Description   Final    BLOOD RIGHT FOREARM Performed at [facility], [address] [place], [place] [address]    Special Requests   Final    BOTTLES DRAWN AEROBIC AND ANAEROBIC Blood Culture results may not be optimal due to an inadequate volume of blood received in culture bottles Performed at [facility], [address], [place], [place] [address]    Culture  Setup Time   Final    GRAM POSITIVE COCCI IN CHAINS AEROBIC BOTTLE ONLY CRITICAL RESULT CALLED TO, READ BACK BY AND VERIFIED WITH: Abe People [id number] @ 2120 FH     Culture   Final    GRAM POSITIVE COCCI IDENTIFICATION AND SUSCEPTIBILITIES TO FOLLOW Performed at [facility], [address], [place], [place] [address]    Report Status PENDING  Incomplete  Blood Culture ID Panel (Reflexed)     Status: Abnormal   Collection Time: 04/22/23  9:53 PM  Result Value Ref Range Status   Enterococcus faecalis NOT DETECTED NOT DETECTED Final   Enterococcus Faecium NOT DETECTED NOT DETECTED Final   Listeria monocytogenes NOT DETECTED NOT DETECTED Final   Staphylococcus species NOT DETECTED NOT DETECTED Final   Staphylococcus aureus (BCID) NOT DETECTED NOT DETECTED Final   Staphylococcus epidermidis NOT DETECTED NOT DETECTED Final   Staphylococcus lugdunensis NOT DETECTED NOT DETECTED Final   Streptococcus species DETECTED (A) NOT DETECTED Final    Comment: Not Enterococcus species, Streptococcus agalactiae, Streptococcus pyogenes, or Streptococcus pneumoniae. CRITICAL RESULT CALLED TO, READ BACK BY AND VERIFIED WITH: Abe People [id number] @ 2120 FH     Streptococcus agalactiae NOT DETECTED NOT DETECTED Final   Streptococcus pneumoniae NOT DETECTED NOT DETECTED Final   Streptococcus pyogenes NOT DETECTED NOT DETECTED Final   A.calcoaceticus-baumannii NOT DETECTED NOT DETECTED Final   Bacteroides fragilis NOT DETECTED NOT DETECTED  Final   Enterobacterales NOT DETECTED NOT DETECTED Final   Enterobacter cloacae complex NOT DETECTED NOT DETECTED Final   Escherichia coli NOT DETECTED NOT DETECTED Final   Klebsiella aerogenes NOT DETECTED NOT DETECTED Final   Klebsiella oxytoca NOT DETECTED NOT DETECTED Final   Klebsiella pneumoniae NOT DETECTED NOT DETECTED Final   Proteus species NOT DETECTED NOT DETECTED Final   Salmonella species NOT DETECTED NOT DETECTED Final   Serratia marcescens NOT DETECTED NOT DETECTED Final   Haemophilus influenzae NOT DETECTED NOT DETECTED Final  Neisseria meningitidis NOT DETECTED NOT DETECTED Final   Pseudomonas aeruginosa NOT DETECTED NOT DETECTED Final   Stenotrophomonas maltophilia NOT DETECTED NOT DETECTED Final   Candida albicans NOT DETECTED NOT DETECTED Final   Candida auris NOT DETECTED NOT DETECTED Final   Candida glabrata NOT DETECTED NOT DETECTED Final   Candida krusei NOT DETECTED NOT DETECTED Final   Candida parapsilosis NOT DETECTED NOT DETECTED Final   Candida tropicalis NOT DETECTED NOT DETECTED Final   Cryptococcus neoformans/gattii NOT DETECTED NOT DETECTED Final    Comment: Performed at [facility], [address], [place], [place] [address]  Culture, blood (Routine X 2) w Reflex to ID Panel     Status: Abnormal   Collection Time: 04/22/23  9:54 PM   Specimen: BLOOD LEFT FOREARM  Result Value Ref Range Status   Specimen Description   Final    BLOOD LEFT FOREARM Performed at [facility], [address], [place], [place] [address]    Special Requests   Final    BOTTLES DRAWN AEROBIC ONLY Blood Culture results may not be optimal due to an inadequate volume of blood received in culture bottles Performed at [facility], [address] [place], [place] [address]    Culture  Setup Time   Final    GRAM POSITIVE COCCI IN CLUSTERS AEROBIC BOTTLE ONLY CRITICAL RESULT CALLED TO, READ BACK BY AND VERIFIED WITH: PHARMD NCarin Hock [id number] @  2120 FH     Culture (A)  Final    STAPHYLOCOCCUS HAEMOLYTICUS THE SIGNIFICANCE OF ISOLATING THIS ORGANISM FROM A SINGLE SET OF BLOOD CULTURES WHEN MULTIPLE SETS ARE DRAWN IS UNCERTAIN. PLEASE NOTIFY THE MICROBIOLOGY DEPARTMENT WITHIN ONE WEEK IF SPECIATION AND SENSITIVITIES ARE REQUIRED. Performed at [facility], [address] [place], [place] [address]    Report Status 04/25/2023 FINAL  Final  Respiratory (~20 pathogens) panel by PCR     Status: None   Collection Time: 04/22/23 11:00 PM   Specimen: Nasopharyngeal Swab; Respiratory  Result Value Ref Range Status   Adenovirus NOT DETECTED NOT DETECTED Final   Coronavirus 229E NOT DETECTED NOT DETECTED Final    Comment: (NOTE) The Coronavirus on the Respiratory Panel, DOES NOT test for the novel  Coronavirus (2019 nCoV)    Coronavirus HKU1 NOT DETECTED NOT DETECTED Final   Coronavirus NL63 NOT DETECTED NOT DETECTED Final   Coronavirus OC43 NOT DETECTED NOT DETECTED Final   Metapneumovirus NOT DETECTED NOT DETECTED Final   Rhinovirus / Enterovirus NOT DETECTED NOT DETECTED Final   Influenza A NOT DETECTED NOT DETECTED Final   Influenza B NOT DETECTED NOT DETECTED Final   Parainfluenza Virus 1 NOT DETECTED NOT DETECTED Final   Parainfluenza Virus 2 NOT DETECTED NOT DETECTED Final   Parainfluenza Virus 3 NOT DETECTED NOT DETECTED Final   Parainfluenza Virus 4 NOT DETECTED NOT DETECTED Final   Respiratory Syncytial Virus NOT DETECTED NOT DETECTED Final   Bordetella pertussis NOT DETECTED NOT DETECTED Final   Bordetella Parapertussis NOT DETECTED NOT DETECTED Final   Chlamydophila pneumoniae NOT DETECTED NOT DETECTED Final   Mycoplasma pneumoniae NOT DETECTED NOT DETECTED Final    Comment: Performed at [facility], [address], [place], [place] [address]  Blood Culture ID Panel (Reflexed)     Status: Abnormal   Collection Time: 04/23/23  9:46 AM  Result Value Ref Range Status   Enterococcus faecalis NOT DETECTED NOT  DETECTED Final   Enterococcus Faecium NOT  DETECTED NOT DETECTED Final   Listeria monocytogenes NOT DETECTED NOT DETECTED Final   Staphylococcus species DETECTED (A) NOT DETECTED Final    Comment: CRITICAL RESULT CALLED TO, READ BACK BY AND VERIFIED WITH: Abe People [id number] @ 2120 FH     Staphylococcus aureus (BCID) NOT DETECTED NOT DETECTED Final   Staphylococcus epidermidis NOT DETECTED NOT DETECTED Final   Staphylococcus lugdunensis NOT DETECTED NOT DETECTED Final   Streptococcus species NOT DETECTED NOT DETECTED Final   Streptococcus agalactiae NOT DETECTED NOT DETECTED Final   Streptococcus pneumoniae NOT DETECTED NOT DETECTED Final   Streptococcus pyogenes NOT DETECTED NOT DETECTED Final   A.calcoaceticus-baumannii NOT DETECTED NOT DETECTED Final   Bacteroides fragilis NOT DETECTED NOT DETECTED Final   Enterobacterales NOT DETECTED NOT DETECTED Final   Enterobacter cloacae complex NOT DETECTED NOT DETECTED Final   Escherichia coli NOT DETECTED NOT DETECTED Final   Klebsiella aerogenes NOT DETECTED NOT DETECTED Final   Klebsiella oxytoca NOT DETECTED NOT DETECTED Final   Klebsiella pneumoniae NOT DETECTED NOT DETECTED Final   Proteus species NOT DETECTED NOT DETECTED Final   Salmonella species NOT DETECTED NOT DETECTED Final   Serratia marcescens NOT DETECTED NOT DETECTED Final   Haemophilus influenzae NOT DETECTED NOT DETECTED Final   Neisseria meningitidis NOT DETECTED NOT DETECTED Final   Pseudomonas aeruginosa NOT DETECTED NOT DETECTED Final   Stenotrophomonas maltophilia NOT DETECTED NOT DETECTED Final   Candida albicans NOT DETECTED NOT DETECTED Final   Candida auris NOT DETECTED NOT DETECTED Final   Candida glabrata NOT DETECTED NOT DETECTED Final   Candida krusei NOT DETECTED NOT DETECTED Final   Candida parapsilosis NOT DETECTED NOT DETECTED Final   Candida tropicalis NOT DETECTED NOT DETECTED Final   Cryptococcus neoformans/gattii NOT DETECTED NOT DETECTED  Final    Comment: Performed at [facility], [address] [place], [place] [address]   Imaging  CTA chest abdomen and pelvis.  Cardiology Data  EKG  Scheduled Meds:  amLODipine  10 mg Oral Daily   insulin aspart  0-6 Units Subcutaneous TID WC   losartan  25 mg Oral Daily   metoprolol tartrate  75 mg Oral BID   thiamine  100 mg Oral Daily   warfarin  1 mg Oral ONCE-1600   Warfarin - Pharmacist Dosing Inpatient   Does not apply q1600   Continuous Infusions:  piperacillin-tazobactam (ZOSYN)  IV 3.375 g (04/25/23 0617)   vancomycin      Principal Problem:   Syncope and collapse Active Problems:   Acute pancreatitis   Abnormal magnetic resonance cholangiopancreatography (MRCP)   Leucocytosis   Benign essential hypertension   Paroxysmal atrial fibrillation (HCC)   Anemia   Abnormal CT of the abdomen   Idiopathic chronic pancreatitis (HCC)   LOS: 2 days

## 2023-04-25 NOTE — Plan of Care (Signed)

## 2023-04-25 NOTE — Progress Notes (Addendum)
 PROGRESS NOTE  Christian Crawford VHQ:[id number] DOB: 1934-01-15 DOA: 04/22/2023 PCP: Ellyn Hack, MD  Brief History   The patient is a 89 yr old man who presented to [facility] ED on 04/22/2023 with complaints of syncope. The patient had been discharged to home earlier in the day after a brief admission for a supertherapeutic INR. He was on coumadin for atrial fibrillation, and presented to a spontaneous left chest wall hematoma. It was reversed with vitamin K. The INR was reduced to 1.5, and the patient was discharged on Lovenox. He stopped at a convenience store on his way home, got dizzy and passed out in the store. He did not strike his head. Exam in the ED was nonfocal. The patient did answer questions appropriately. Earlier this month the patient had undergone an MRCP and had visited GI which demonstrated an abnormal gallbladder up to 4.3 cm with circumferential wall thickening, also dilatation of the pancreatic duct and abnormal MRCP. Abdominal ultrasound on 04/22/2023 demonstrated no evidence of acute cholecystitis. CTA of the chest and abdomen demonstrated acute pancreatitis with possibly developing pseudocyst.  GI was consulted and determined that no ERCP was necessary. Recommendation is that the patient follow up with [facility] GI as previously planned as outpatient. EEG and echocardiogram has been ordered.  Pharmacy has been consulted to assist with doseing Vancomycin. Repeat blood cultures have been drawn.  A & P  Syncope and collapse The patient has been admitted to a telemetry bed. He will also have an EEG and a echocargiogram. The patient did meet criteria for sepsis upon presentation. He was tachycardic, RR elevated to 23, and there was a leukocytosis of 17.8 WBC. The 17.8 was actually decreased from 21.5 on 04/22/2023. There is a putative source for sepsis in the patient's CTA chest abdomen and pelvis with acute on chronic pancreatitis and possible pseudocyst and possible cholecystitis. The  patient has been discussed with gasgroenterology and general surgery. Neither service sees a need for intervention of further investigation at this time.  Staph bacteremia Echocardiogram without evidence of vegetations. The patient was recently inpatient and is at risk for MSSA or MRSA bacteremia. This would also explain the patient's presentation meeting sepsis criteria. Will change to vancomycin.  Acute pancreatitis Mild, CT abdomen shows acute on chronic pancreatitis suspected may be just starting. Lipid panel pending. No need for ERCP per GI. Recommendation is for follow up with [facility] GI as outpatient.  Abnormal magnetic resonance cholangiopancreatography (MRCP) Patient is being referred to [facility] GI for evaluation of stricture or mass.  See report below: IMPRESSION: 1. Dilatation of the pancreatic duct measuring up to 0.7 cm, with abrupt caliber change in the pancreatic head neck junction. Diffusely atrophic pancreatic parenchyma distal to this point. Generally normal appearance of the pancreatic head. This appearance has evolved over prior examinations dating back to 07/24/2019 and most likely reflects chronic sequelae of pancreatitis, without discretely visible mass. However, given motion limitations on today's examination consider ERCP to further evaluate for ductal stricture or occult mass. 2. 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 This requires no specific further follow-up given small size and advanced patient age. 3. No acute inflammatory findings in the abdomen at this time. The patient was discussed with GI and General surgery. It is felt that these changes are chronic. No need for intervention. They patient is urged to follow up with his GI in [facility].  Aortic Atherosclerosis (ICD10-I70.0). Noted.   Leucocytosis Suspect secondary to biliary etiology and/or  bacteremia. Pt was empirically started on zosyn. Will transition to augment for PO option for discharge. WBC has decreased from 17.8 to 14.1 Continue to monitor.   Benign essential hypertension Patient continued on metoprolol, losartan, amlodipine and hydralazine.   Paroxysmal atrial fibrillation (HCC) Currently in sinus rhythm. Patient continued on Coumadin per pharmacy protocol.  Anemia Patient is hemoglobin has been trending down from 11.9-8.9 today CT angio chest abdomen and pelvis is negative.  Chronic blood loss through the gut. -Type and screen, IV PPI, stool guaiac and follow.   Elevated INR (Resolved: 04/22/2023) INR/Prothrombin Time subtherapeutic will consult pharmacy for Coumadin management for paroxysmal atrial fibrillation.   Sliding scale insulin regimen.   DVT prophylaxis:  Coumadin  Consults:  None  Advance Care Planning:    Code Status: Prior    Family Communication:  None  Disposition Plan:  TBD   I have seen and examined this patient myself. I have spent 32 minutes in her evaluation and care.  Faiz Weber, DO [facility] Direct contact: see [email or web address]  7PM-7AM contact night coverage as above 04/25/2023, 5:13 PM  LOS: 2 days   Consultants  Gastroenterology General surgery  Procedures  EEG ordered  Antibiotics   Anti-infectives (From admission, onward)    Start     Dose/Rate Route Frequency Ordered Stop   04/25/23 1400  vancomycin (VANCOREADY) IVPB 1750 mg/350 mL        1,750 mg 175 mL/hr over 120 Minutes Intravenous Every 24 hours 04/25/23 1310     04/22/23 2330  piperacillin-tazobactam (ZOSYN) IVPB 3.375 g        3.375 g 12.5 mL/hr over 240 Minutes Intravenous Every 8 hours 04/22/23 2310          Interval History/Subjective  The patient is resting comfortably. No new complaints.  Objective   Vitals:  Vitals:   04/25/23 0844 04/25/23 1244  BP: 133/65 (!) 142/64  Pulse: 78 78  Resp:  (!) 25  Temp:  98 F (36.7 C)   SpO2:  100%    Exam:  Constitutional:  The patient is awake, alert, and oriented x 3. No acute distress. Respiratory:  No increased work of breathing. No wheezes, rales, or rhonchi No tactile fremitus Cardiovascular:  Regular rate and rhythm No murmurs, ectopy, or gallups. No lateral PMI. No thrills. Abdomen:  Abdomen is soft, non-tender, non-distended No hernias, masses, or organomegaly Normoactive bowel sounds.  Musculoskeletal:  No cyanosis, clubbing, or edema Skin:  No rashes, lesions, ulcers palpation of skin: no induration or nodules Neurologic:  CN 2-12 intact Sensation all 4 extremities intact Psychiatric:  Mental status Mood, affect appropriate Orientation to person, place, time  judgment and insight appear intact  I have personally reviewed the following:   Today's Data   Vitals:   04/25/23 0844 04/25/23 1244  BP: 133/65 (!) 142/64  Pulse: 78 78  Resp:  (!) 25  Temp:  98 F (36.7 C)  SpO2:  100%     Lab Data  CBC    Component Value Date/Time   WBC 14.1 (H) 04/25/2023 0920   RBC 2.85 (L) 04/25/2023 0920   HGB 7.9 (L) 04/25/2023 0920   HCT 25.3 (L) 04/25/2023 0920   PLT 602 (H) 04/25/2023 0920   MCV 88.8 04/25/2023 0920   MCH 27.7 04/25/2023 0920   MCHC 31.2 04/25/2023 0920   RDW 15.8 (H) 04/25/2023 0920   LYMPHSABS 3.3 04/25/2023  0920   MONOABS 1.2 (H) 04/25/2023 0920   EOSABS 0.4 04/25/2023 0920   BASOSABS 0.1 04/25/2023 0920      Latest Ref Rng & Units 04/25/2023    9:20 AM 04/24/2023    5:52 AM 04/23/2023    6:11 AM  BMP  Glucose 70 - 99 mg/dL 119  147  829   BUN 8 - 23 mg/dL 11  9  10    Creatinine 0.61 - 1.24 mg/dL 5.62  1.30  8.65   Sodium 135 - 145 mmol/L 141  137  137   Potassium 3.5 - 5.1 mmol/L 4.1  3.1  3.5   Chloride 98 - 111 mmol/L 111  105  103   CO2 22 - 32 mmol/L 22  23  22    Calcium 8.9 - 10.3 mg/dL 8.4  8.2  8.6      Micro Data   Results for orders placed or performed during the hospital encounter of  04/22/23  Culture, blood (Routine X 2) w Reflex to ID Panel     Status: None (Preliminary result)   Collection Time: 04/22/23  9:53 PM   Specimen: BLOOD RIGHT FOREARM  Result Value Ref Range Status   Specimen Description   Final    BLOOD RIGHT FOREARM Performed at [facility], [address], [place], [place] [address]    Special Requests   Final    BOTTLES DRAWN AEROBIC AND ANAEROBIC Blood Culture results may not be optimal due to an inadequate volume of blood received in culture bottles Performed at [facility], [address], [place], [place] [address]    Culture  Setup Time   Final    GRAM POSITIVE COCCI IN CHAINS AEROBIC BOTTLE ONLY CRITICAL RESULT CALLED TO, READ BACK BY AND VERIFIED WITH: Abe People [id number] @ 2120 FH     Culture   Final    GRAM POSITIVE COCCI IDENTIFICATION AND SUSCEPTIBILITIES TO FOLLOW Performed at [facility], [address], [place], [place] [address]    Report Status PENDING  Incomplete  Blood Culture ID Panel (Reflexed)     Status: Abnormal   Collection Time: 04/22/23  9:53 PM  Result Value Ref Range Status   Enterococcus faecalis NOT DETECTED NOT DETECTED Final   Enterococcus Faecium NOT DETECTED NOT DETECTED Final   Listeria monocytogenes NOT DETECTED NOT DETECTED Final   Staphylococcus species NOT DETECTED NOT DETECTED Final   Staphylococcus aureus (BCID) NOT DETECTED NOT DETECTED Final   Staphylococcus epidermidis NOT DETECTED NOT DETECTED Final   Staphylococcus lugdunensis NOT DETECTED NOT DETECTED Final   Streptococcus species DETECTED (A) NOT DETECTED Final    Comment: Not Enterococcus species, Streptococcus agalactiae, Streptococcus pyogenes, or Streptococcus pneumoniae. CRITICAL RESULT CALLED TO, READ BACK BY AND VERIFIED WITH: Abe People [id number] @ 2120 FH     Streptococcus agalactiae NOT DETECTED NOT DETECTED Final   Streptococcus pneumoniae NOT DETECTED NOT DETECTED Final   Streptococcus  pyogenes NOT DETECTED NOT DETECTED Final   A.calcoaceticus-baumannii NOT DETECTED NOT DETECTED Final   Bacteroides fragilis NOT DETECTED NOT DETECTED Final   Enterobacterales NOT DETECTED NOT DETECTED Final   Enterobacter cloacae complex NOT DETECTED NOT DETECTED Final   Escherichia coli NOT DETECTED NOT DETECTED Final   Klebsiella aerogenes NOT DETECTED NOT DETECTED Final   Klebsiella oxytoca NOT DETECTED NOT DETECTED Final   Klebsiella pneumoniae NOT DETECTED NOT DETECTED Final   Proteus species NOT DETECTED NOT DETECTED Final   Salmonella  species NOT DETECTED NOT DETECTED Final   Serratia marcescens NOT DETECTED NOT DETECTED Final   Haemophilus influenzae NOT DETECTED NOT DETECTED Final   Neisseria meningitidis NOT DETECTED NOT DETECTED Final   Pseudomonas aeruginosa NOT DETECTED NOT DETECTED Final   Stenotrophomonas maltophilia NOT DETECTED NOT DETECTED Final   Candida albicans NOT DETECTED NOT DETECTED Final   Candida auris NOT DETECTED NOT DETECTED Final   Candida glabrata NOT DETECTED NOT DETECTED Final   Candida krusei NOT DETECTED NOT DETECTED Final   Candida parapsilosis NOT DETECTED NOT DETECTED Final   Candida tropicalis NOT DETECTED NOT DETECTED Final   Cryptococcus neoformans/gattii NOT DETECTED NOT DETECTED Final    Comment: Performed at [facility], [address] [place], [place] [address]  Culture, blood (Routine X 2) w Reflex to ID Panel     Status: Abnormal   Collection Time: 04/22/23  9:54 PM   Specimen: BLOOD LEFT FOREARM  Result Value Ref Range Status   Specimen Description   Final    BLOOD LEFT FOREARM Performed at [facility], [address] [place], [place] [address]    Special Requests   Final    BOTTLES DRAWN AEROBIC ONLY Blood Culture results may not be optimal due to an inadequate volume of blood received in culture bottles Performed at [facility], [address], [place], [place] [address]    Culture  Setup Time    Final    GRAM POSITIVE COCCI IN CLUSTERS AEROBIC BOTTLE ONLY CRITICAL RESULT CALLED TO, READ BACK BY AND VERIFIED WITH: PHARMD NCarin Hock [id number] @ 2120 FH     Culture (A)  Final    STAPHYLOCOCCUS HAEMOLYTICUS THE SIGNIFICANCE OF ISOLATING THIS ORGANISM FROM A SINGLE SET OF BLOOD CULTURES WHEN MULTIPLE SETS ARE DRAWN IS UNCERTAIN. PLEASE NOTIFY THE MICROBIOLOGY DEPARTMENT WITHIN ONE WEEK IF SPECIATION AND SENSITIVITIES ARE REQUIRED. Performed at [facility], [address] [place], [place] [address]    Report Status 04/25/2023 FINAL  Final  Respiratory (~20 pathogens) panel by PCR     Status: None   Collection Time: 04/22/23 11:00 PM   Specimen: Nasopharyngeal Swab; Respiratory  Result Value Ref Range Status   Adenovirus NOT DETECTED NOT DETECTED Final   Coronavirus 229E NOT DETECTED NOT DETECTED Final    Comment: (NOTE) The Coronavirus on the Respiratory Panel, DOES NOT test for the novel  Coronavirus (2019 nCoV)    Coronavirus HKU1 NOT DETECTED NOT DETECTED Final   Coronavirus NL63 NOT DETECTED NOT DETECTED Final   Coronavirus OC43 NOT DETECTED NOT DETECTED Final   Metapneumovirus NOT DETECTED NOT DETECTED Final   Rhinovirus / Enterovirus NOT DETECTED NOT DETECTED Final   Influenza A NOT DETECTED NOT DETECTED Final   Influenza B NOT DETECTED NOT DETECTED Final   Parainfluenza Virus 1 NOT DETECTED NOT DETECTED Final   Parainfluenza Virus 2 NOT DETECTED NOT DETECTED Final   Parainfluenza Virus 3 NOT DETECTED NOT DETECTED Final   Parainfluenza Virus 4 NOT DETECTED NOT DETECTED Final   Respiratory Syncytial Virus NOT DETECTED NOT DETECTED Final   Bordetella pertussis NOT DETECTED NOT DETECTED Final   Bordetella Parapertussis NOT DETECTED NOT DETECTED Final   Chlamydophila pneumoniae NOT DETECTED NOT DETECTED Final   Mycoplasma pneumoniae NOT DETECTED NOT DETECTED Final    Comment: Performed at [facility], [address] [place], [place] [address]  Blood Culture ID  Panel (Reflexed)     Status: Abnormal  Collection Time: 04/23/23  9:46 AM  Result Value Ref Range Status   Enterococcus faecalis NOT DETECTED NOT DETECTED Final   Enterococcus Faecium NOT DETECTED NOT DETECTED Final   Listeria monocytogenes NOT DETECTED NOT DETECTED Final   Staphylococcus species DETECTED (A) NOT DETECTED Final    Comment: CRITICAL RESULT CALLED TO, READ BACK BY AND VERIFIED WITH: Abe People [id number] @ 2120 FH     Staphylococcus aureus (BCID) NOT DETECTED NOT DETECTED Final   Staphylococcus epidermidis NOT DETECTED NOT DETECTED Final   Staphylococcus lugdunensis NOT DETECTED NOT DETECTED Final   Streptococcus species NOT DETECTED NOT DETECTED Final   Streptococcus agalactiae NOT DETECTED NOT DETECTED Final   Streptococcus pneumoniae NOT DETECTED NOT DETECTED Final   Streptococcus pyogenes NOT DETECTED NOT DETECTED Final   A.calcoaceticus-baumannii NOT DETECTED NOT DETECTED Final   Bacteroides fragilis NOT DETECTED NOT DETECTED Final   Enterobacterales NOT DETECTED NOT DETECTED Final   Enterobacter cloacae complex NOT DETECTED NOT DETECTED Final   Escherichia coli NOT DETECTED NOT DETECTED Final   Klebsiella aerogenes NOT DETECTED NOT DETECTED Final   Klebsiella oxytoca NOT DETECTED NOT DETECTED Final   Klebsiella pneumoniae NOT DETECTED NOT DETECTED Final   Proteus species NOT DETECTED NOT DETECTED Final   Salmonella species NOT DETECTED NOT DETECTED Final   Serratia marcescens NOT DETECTED NOT DETECTED Final   Haemophilus influenzae NOT DETECTED NOT DETECTED Final   Neisseria meningitidis NOT DETECTED NOT DETECTED Final   Pseudomonas aeruginosa NOT DETECTED NOT DETECTED Final   Stenotrophomonas maltophilia NOT DETECTED NOT DETECTED Final   Candida albicans NOT DETECTED NOT DETECTED Final   Candida auris NOT DETECTED NOT DETECTED Final   Candida glabrata NOT DETECTED NOT DETECTED Final   Candida krusei NOT DETECTED NOT DETECTED Final   Candida parapsilosis  NOT DETECTED NOT DETECTED Final   Candida tropicalis NOT DETECTED NOT DETECTED Final   Cryptococcus neoformans/gattii NOT DETECTED NOT DETECTED Final    Comment: Performed at [facility], [address], [place], [place] [address]   Imaging  CTA chest abdomen and pelvis.  Cardiology Data  EKG  Scheduled Meds:  amLODipine  10 mg Oral Daily   insulin aspart  0-6 Units Subcutaneous TID WC   losartan  25 mg Oral Daily   metoprolol tartrate  75 mg Oral BID   thiamine  100 mg Oral Daily   Warfarin - Pharmacist Dosing Inpatient   Does not apply q1600   Continuous Infusions:  piperacillin-tazobactam (ZOSYN)  IV 3.375 g (04/25/23 1553)   vancomycin 1,750 mg (04/25/23 1541)    Principal Problem:   Syncope and collapse Active Problems:   Acute pancreatitis   Abnormal magnetic resonance cholangiopancreatography (MRCP)   Leucocytosis   Benign essential hypertension   Paroxysmal atrial fibrillation (HCC)   Anemia   Abnormal CT of the abdomen   Idiopathic chronic pancreatitis (HCC)   LOS: 2 days

## 2023-04-26 DIAGNOSIS — R55 Syncope and collapse: Secondary | ICD-10-CM | POA: Diagnosis not present

## 2023-04-26 LAB — BASIC METABOLIC PANEL
Anion gap: 8 (ref 5–15)
BUN: 10 mg/dL (ref 8–23)
CO2: 22 mmol/L (ref 22–32)
Calcium: 8.1 mg/dL — ABNORMAL LOW (ref 8.9–10.3)
Chloride: 108 mmol/L (ref 98–111)
Creatinine, Ser: 0.65 mg/dL (ref 0.61–1.24)
GFR, Estimated: >60 mL/min (ref >60)
Glucose, Bld: 112 mg/dL — ABNORMAL HIGH (ref 70–99)
Potassium: 3.9 mmol/L (ref 3.5–5.1)
Sodium: 138 mmol/L (ref 135–145)

## 2023-04-26 LAB — CBC WITH DIFFERENTIAL/PLATELET
Abs Immature Granulocytes: 0.09 10*3/uL — ABNORMAL HIGH (ref 0.00–0.07)
Basophils Absolute: 0.1 10*3/uL (ref 0.0–0.1)
Basophils Relative: 1 %
Eosinophils Absolute: 0.4 10*3/uL (ref 0.0–0.5)
Eosinophils Relative: 3 %
HCT: 24.1 % — ABNORMAL LOW (ref 39.0–52.0)
Hemoglobin: 7.6 g/dL — ABNORMAL LOW (ref 13.0–17.0)
Immature Granulocytes: 1 %
Lymphocytes Relative: 29 %
Lymphs Abs: 3.2 10*3/uL (ref 0.7–4.0)
MCH: 27.8 pg (ref 26.0–34.0)
MCHC: 31.5 g/dL (ref 30.0–36.0)
MCV: 88.3 fL (ref 80.0–100.0)
Monocytes Absolute: 0.8 10*3/uL (ref 0.1–1.0)
Monocytes Relative: 8 %
Neutro Abs: 6.5 10*3/uL (ref 1.7–7.7)
Neutrophils Relative %: 58 %
Platelets: 556 10*3/uL — ABNORMAL HIGH (ref 150–400)
RBC: 2.73 MIL/uL — ABNORMAL LOW (ref 4.22–5.81)
RDW: 16.2 % — ABNORMAL HIGH (ref 11.5–15.5)
WBC: 11 10*3/uL — ABNORMAL HIGH (ref 4.0–10.5)
nRBC: 0 % (ref 0.0–0.2)

## 2023-04-26 LAB — CULTURE, BLOOD (ROUTINE X 2)

## 2023-04-26 LAB — GLUCOSE, CAPILLARY
Glucose-Capillary: 107 mg/dL — ABNORMAL HIGH (ref 70–99)
Glucose-Capillary: 169 mg/dL — ABNORMAL HIGH (ref 70–99)
Glucose-Capillary: 94 mg/dL (ref 70–99)

## 2023-04-26 LAB — PROTIME-INR
INR: 2.7 — ABNORMAL HIGH (ref 0.8–1.2)
Prothrombin Time: 29.2 s — ABNORMAL HIGH (ref 11.4–15.2)

## 2023-04-26 MED ORDER — WARFARIN SODIUM 4 MG PO TABS
4.0000 mg | ORAL_TABLET | Freq: Once | ORAL | Status: AC
  Administered 2023-04-26: 4 mg via ORAL
  Filled 2023-04-26: qty 1

## 2023-04-26 NOTE — Progress Notes (Signed)
PHARMACY - ANTICOAGULATION CONSULT NOTE  Pharmacy Consult for Coumadin Indication: atrial fibrillation  Allergies  Allergen Reactions   Lisinopril Cough    Patient Measurements: Height: 6\' 1"  (185.4 cm) Weight: 79 kg (174 lb 2.6 oz) IBW/kg (Calculated) : 79.9  Vital Signs: Temp: 98.3 F (36.8 C) (02/01 0522) Temp Source: Oral (02/01 0522) BP: 132/76 (02/01 0942) Pulse Rate: 76 (02/01 0941)  Labs: Recent Labs    04/24/23 0552 04/25/23 0523 04/25/23 0920 04/26/23 0700  HGB 7.7*  --  7.9* 7.6*  HCT 24.3*  --  25.3* 24.1*  PLT 570*  --  602* 556*  LABPROT 24.1* 28.8*  --  29.2*  INR 2.1* 2.7*  --  2.7*  CREATININE 0.63  --  0.63 0.65    Estimated Creatinine Clearance: 71.3 mL/min (by C-G formula based on SCr of 0.65 mg/dL).   Medical History: Past Medical History:  Diagnosis Date   A-fib [facility])    Acute alcoholic pancreatitis 07/24/2019   Diverticulitis 11/13/2021   Heavy alcohol use 07/24/2019   Hematuria 11/14/2021   Hypertension    TIA (transient ischemic attack)     Assessment: AC/Heme: Coumadin PTA for afib - Hgb low/stable, Plts elevated  - 1/27:  INR is 7.8,  Vit K 5mg  IV x 1 given in ED.  Hgb 11.9 to 9.0 - 1/28: INR 1.5, Hgb down to 8.4.  Warfarin 10mg   - 1/29: INR 1.9, Coumadin 5mg  ordered- NOT CHARTED (SZP) - 1/30: INR 2.1, Coumadin 6mg  po x 1  - 1/31: INR 2.7 increasing quickly, Coumadin 1 mg x 1 - 2/1:   INR 2.7 stabilizing  Goal of Therapy:  INR 2-3 Monitor platelets by anticoagulation protocol: Yes   Plan:  Coumadin 4 mg po x 1 today Daily INR   Thank you for allowing pharmacy to be a part of this patient's care.  Selinda Eon, PharmD, BCPS Clinical Pharmacist [place] 04/26/2023 11:04 AM

## 2023-04-26 NOTE — Plan of Care (Signed)
  Problem: Coping: Goal: Ability to adjust to condition or change in health will improve Outcome: Progressing   Problem: Nutritional: Goal: Maintenance of adequate nutrition will improve Outcome: Progressing   Problem: Skin Integrity: Goal: Risk for impaired skin integrity will decrease Outcome: Progressing   Problem: Pain Managment: Goal: General experience of comfort will improve and/or be controlled Outcome: Progressing   Problem: Safety: Goal: Ability to remain free from injury will improve Outcome: Progressing

## 2023-04-27 DIAGNOSIS — R55 Syncope and collapse: Secondary | ICD-10-CM | POA: Diagnosis not present

## 2023-04-27 LAB — CBC WITH DIFFERENTIAL/PLATELET
Abs Immature Granulocytes: 0.08 10*3/uL — ABNORMAL HIGH (ref 0.00–0.07)
Basophils Absolute: 0.1 10*3/uL (ref 0.0–0.1)
Basophils Relative: 0 %
Eosinophils Absolute: 0.4 10*3/uL (ref 0.0–0.5)
Eosinophils Relative: 4 %
HCT: 24.9 % — ABNORMAL LOW (ref 39.0–52.0)
Hemoglobin: 7.9 g/dL — ABNORMAL LOW (ref 13.0–17.0)
Immature Granulocytes: 1 %
Lymphocytes Relative: 23 %
Lymphs Abs: 2.9 10*3/uL (ref 0.7–4.0)
MCH: 28.1 pg (ref 26.0–34.0)
MCHC: 31.7 g/dL (ref 30.0–36.0)
MCV: 88.6 fL (ref 80.0–100.0)
Monocytes Absolute: 0.9 10*3/uL (ref 0.1–1.0)
Monocytes Relative: 7 %
Neutro Abs: 8.1 10*3/uL — ABNORMAL HIGH (ref 1.7–7.7)
Neutrophils Relative %: 65 %
Platelets: 559 10*3/uL — ABNORMAL HIGH (ref 150–400)
RBC: 2.81 MIL/uL — ABNORMAL LOW (ref 4.22–5.81)
RDW: 16.5 % — ABNORMAL HIGH (ref 11.5–15.5)
WBC: 12.4 10*3/uL — ABNORMAL HIGH (ref 4.0–10.5)
nRBC: 0 % (ref 0.0–0.2)

## 2023-04-27 LAB — BASIC METABOLIC PANEL
Anion gap: 7 (ref 5–15)
BUN: 8 mg/dL (ref 8–23)
CO2: 21 mmol/L — ABNORMAL LOW (ref 22–32)
Calcium: 8.2 mg/dL — ABNORMAL LOW (ref 8.9–10.3)
Chloride: 108 mmol/L (ref 98–111)
Creatinine, Ser: 0.63 mg/dL (ref 0.61–1.24)
GFR, Estimated: >60 mL/min (ref >60)
Glucose, Bld: 102 mg/dL — ABNORMAL HIGH (ref 70–99)
Potassium: 3.8 mmol/L (ref 3.5–5.1)
Sodium: 136 mmol/L (ref 135–145)

## 2023-04-27 LAB — GLUCOSE, CAPILLARY
Glucose-Capillary: 93 mg/dL (ref 70–99)
Glucose-Capillary: 122 mg/dL — ABNORMAL HIGH (ref 70–99)
Glucose-Capillary: 103 mg/dL — ABNORMAL HIGH (ref 70–99)
Glucose-Capillary: 105 mg/dL — ABNORMAL HIGH (ref 70–99)

## 2023-04-27 LAB — PROTIME-INR
INR: 3 — ABNORMAL HIGH (ref 0.8–1.2)
Prothrombin Time: 31 s — ABNORMAL HIGH (ref 11.4–15.2)

## 2023-04-27 MED ORDER — WARFARIN SODIUM 4 MG PO TABS
4.0000 mg | ORAL_TABLET | Freq: Once | ORAL | Status: AC
  Administered 2023-04-27: 4 mg via ORAL
  Filled 2023-04-27: qty 1

## 2023-04-27 MED ORDER — LATANOPROST 0.005 % OP SOLN
1.0000 [drp] | Freq: Every day | OPHTHALMIC | Status: DC
  Administered 2023-04-27 – 2023-04-30 (×4): 1 [drp] via OPHTHALMIC
  Filled 2023-04-27: qty 2.5

## 2023-04-27 NOTE — Progress Notes (Addendum)
PROGRESS NOTE    Christian Crawford  ZOX:[id number] DOB: 21-Nov-1933 DOA: 04/22/2023 PCP: Ellyn Hack, MD  Brief Narrative:89 yr old man who presented to [facility] ED on 04/22/2023 with complaints of syncope. The patient had been discharged to home earlier in the day after a brief admission for a supertherapeutic INR. He was on coumadin for atrial fibrillation, and presented to a spontaneous left chest wall hematoma. It was reversed with vitamin K. The INR was reduced to 1.5, and the patient was discharged on Lovenox. He stopped at a convenience store on his way home, got dizzy and passed out in the store. He did not strike his head. Exam in the ED was nonfocal. The patient did answer questions appropriately. Earlier this month the patient had undergone an MRCP and had visited GI which demonstrated an abnormal gallbladder up to 4.3 cm with circumferential wall thickening, also dilatation of the pancreatic duct and abnormal MRCP. Abdominal ultrasound on 04/22/2023 demonstrated no evidence of acute cholecystitis. CTA of the chest and abdomen demonstrated acute pancreatitis with possibly developing pseudocyst.   GI was consulted and determined that no ERCP was necessary. Recommendation is that the patient follow up with [facility] as previously planned as outpatient. EEG and echocardiogram has been ordered.  Assessment & Plan:   Principal Problem:   Syncope and collapse Active Problems:   Acute pancreatitis   Abnormal magnetic resonance cholangiopancreatography (MRCP)   Leucocytosis   Benign essential hypertension   Paroxysmal atrial fibrillation (HCC)   Anemia   Abnormal CT of the abdomen   Idiopathic chronic pancreatitis (HCC)   #1 syncope exact etiology is unknown but could be related to multiple multifactorial reasons.  Wonder if it was orthostatic on admission.  Will check orthostatic vital signs today. Workup included an EEG, echocardiogram, CT angiogram of the chest abdomen and pelvis EEG  did not reveal any epileptiform discharges Echo ejection fraction left ventricular 65 to 70%.  Grade 1 diastolic dysfunction.  Valves appear to be okay. 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 Lipase was 77 on admission which was lower than prior. Lipase 49 04/22/2023   #2 Staph bacteremia 1 out of 2 cultures staph species Blood cultures from 04/23/2023 staph species Blood cultures from 04/22/2023 strep viridans sensitive to vancomycin Blood culture 1/28 staph hemolyticus Transthoracic echo with no vegetations noted Currently on vancomycin followed by pharmacy On vancomycin and zosyn  #3 acute pancreatitis acute on chronic pancreatitis by CT scan Lipase back to normal  MRCP Dilatation of the pancreatic duct measuring up to 0.7 cm, with abrupt caliber change in the pancreatic head neck junction. Diffusely atrophic pancreatic parenchyma distal to this point. Generally normal appearance of the pancreatic head. This appearance has evolved over prior examinations dating back to 07/24/2019 and most likely reflects chronic sequelae of pancreatitis, without discretely visible mass. 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 This requires no specific further follow-up given small size and advanced patient age. No acute inflammatory findings in the abdomen at this time. The patient was discussed with GI and General surgery. It is felt that these changes are chronic. No  need for intervention. They patient is urged to follow up with his GI in  [facility]. Patient has an appointment with GI [facility] on June 20, 2023.  That is the earliest appointment family could get.  GI recommends to follow-up with [facility] GI Seen by GI and general surgery Lipid panel LDL 39 triglycerides 76 cholesterol 86   #4 essential hypertension on Norvasc hydralazine losartan and metoprolol  #5 paroxysmal atrial fibrillation on Coumadin rate controlled with metoprolol  #6 chronic anemia-hemoglobin has trended down hemoglobin 7.9 from 11.9 on April 11, 2023 partly related to hemodilution, INR was 7.8 on 04/21/2023, INR today is 3.0 followed by pharmacy.  Will check FOBT and anemia panel Follow up levels in AM.  #7 leukocytosis 12.4 from 17.8   #8 elevated INR resolved INR 3.0 on Coumadin followed by pharmacy  #9 Glaucoma restart latanoprost eyedrops  Estimated body mass index is 22.98 kg/m as calculated from the following:   Height as of this encounter: 6\' 1"  (1.854 m).   Weight as of this encounter: 79 kg.  DVT prophylaxis: Coumadin  code Status: FULL Family Communication: I discussed with 2 sisters on the phone Clydie Braun and Misty Stanley Patient lives with Clydie Braun who is not at home during the day Disposition Plan:  Status is: Inpatient Remains inpatient appropriate because: Acute illness   Consultants: GI and general surgery  Procedures: None Antimicrobials: Vancomycin Subjective:  Teeth pulled end of nov Family concerned this patient is physically getting weak laying in bed PT valuation pending they think he might need some rehab prior to returning home Says he is happy to be alive  Objective: Vitals:   04/26/23 0942 04/26/23 1156 04/26/23 2029 04/27/23 0410  BP: 132/76 129/74 129/67 (!) 144/67  Pulse:  77 90 77  Resp:  18 18 16   Temp:  98.3 F (36.8 C) 98.3 F (36.8 C) 98.2 F (36.8 C)  TempSrc:      SpO2:  100% 100% 99%  Weight:      Height:        Intake/Output Summary (Last 24 hours) at 04/27/2023 1107 Last data filed at  04/27/2023 0800 Gross per 24 hour  Intake 1086.25 ml  Output 1425 ml  Net -338.75 ml   Filed Weights   04/22/23 1325  Weight: 79 kg    Examination:  General exam: Appears in no acute distress Respiratory system: Clear to auscultation. Respiratory effort normal. Cardiovascular system: Regular  gastrointestinal system: Abdomen is nondistended, soft and nontender. No organomegaly or masses felt. Normal bowel sounds heard. Central nervous system: Alert and oriented. Extremities: No edema  Data Reviewed: I have personally reviewed following labs and imaging studies  CBC: Recent Labs  Lab 04/23/23 0611 04/24/23 0552 04/25/23 0920 04/26/23 0700 04/27/23 0539  WBC 17.8* 14.0* 14.1* 11.0* 12.4*  NEUTROABS 13.0* 8.6* 9.2* 6.5 8.1*  HGB 8.3* 7.7* 7.9* 7.6* 7.9*  HCT 25.8* 24.3* 25.3* 24.1* 24.9*  MCV 85.7 86.8 88.8 88.3 88.6  PLT 607* 570* 602* 556* 559*   Basic Metabolic Panel: Recent Labs  Lab 04/23/23 0611 04/24/23 0552 04/25/23 0920 04/26/23 0700 04/27/23 0539  NA 137 137 141 138 136  K 3.5 3.1* 4.1 3.9 3.8  CL 103 105 111 108 108  CO2 22 23 22 22  21*  GLUCOSE 146* 114* 111* 112* 102*  BUN 10 9 11 10 8   CREATININE 0.33* 0.63 0.63 0.65 0.63  CALCIUM 8.6* 8.2* 8.4* 8.1* 8.2*  MG 1.6*  --   --   --   --  PHOS 2.6  --   --   --   --    GFR: Estimated Creatinine Clearance: 71.3 mL/min (by C-G formula based on SCr of 0.63 mg/dL). Liver Function Tests: Recent Labs  Lab 04/21/23 1105 04/22/23 0406 04/22/23 2043 04/23/23 0611  AST 34 24 28 25   ALT 18 16 16 16   ALKPHOS 89 86 86 81  BILITOT 0.9 1.0 1.1 0.9  PROT 6.5 6.1* 6.3* 6.3*  ALBUMIN 2.9* 2.6* 2.7* 2.9*   Recent Labs  Lab 04/21/23 1105 04/22/23 1649  LIPASE 77* 49   Recent Labs  Lab 04/21/23 1153  AMMONIA 13   Coagulation Profile: Recent Labs  Lab 04/23/23 1143 04/24/23 0552 04/25/23 0523 04/26/23 0700 04/27/23 0539  INR 1.9* 2.1* 2.7* 2.7* 3.0*   Cardiac Enzymes: No results for  input(s): "CKTOTAL", "CKMB", "CKMBINDEX", "TROPONINI" in the last 168 hours. BNP (last 3 results) No results for input(s): "PROBNP" in the last 8760 hours. HbA1C: No results for input(s): "HGBA1C" in the last 72 hours. CBG: Recent Labs  Lab 04/25/23 2143 04/26/23 0749 04/26/23 1154 04/26/23 1638 04/27/23 0744  GLUCAP 151* 107* 169* 94 93   Lipid Profile: No results for input(s): "CHOL", "HDL", "LDLCALC", "TRIG", "CHOLHDL", "LDLDIRECT" in the last 72 hours. Thyroid Function Tests: No results for input(s): "TSH", "T4TOTAL", "FREET4", "T3FREE", "THYROIDAB" in the last 72 hours. Anemia Panel: No results for input(s): "VITAMINB12", "FOLATE", "FERRITIN", "TIBC", "IRON", "RETICCTPCT" in the last 72 hours. Sepsis Labs: Recent Labs  Lab 04/22/23 1707  LATICACIDVEN 1.8    Recent Results (from the past 240 hours)  Culture, blood (Routine X 2) w Reflex to ID Panel     Status: Abnormal   Collection Time: 04/22/23  9:53 PM   Specimen: BLOOD RIGHT FOREARM  Result Value Ref Range Status   Specimen Description   Final    BLOOD RIGHT FOREARM Performed at [facility], [address], [place], [place] [address]    Special Requests   Final    BOTTLES DRAWN AEROBIC AND ANAEROBIC Blood Culture results may not be optimal due to an inadequate volume of blood received in culture bottles Performed at [facility], [address], [place], [place] [address]    Culture  Setup Time   Final    GRAM POSITIVE COCCI IN CHAINS AEROBIC BOTTLE ONLY CRITICAL RESULT CALLED TO, READ BACK BY AND VERIFIED WITH: Abe People [id number] @ 2120 FH  Performed at [facility], [address], [place], [place] [address]    Culture VIRIDANS STREPTOCOCCUS (A)  Final   Report Status 04/26/2023 FINAL  Final   Organism ID, Bacteria VIRIDANS STREPTOCOCCUS  Final      Susceptibility   Viridans streptococcus - MIC*    PENICILLIN <=0.06 SENSITIVE Sensitive     CEFTRIAXONE <=0.12  SENSITIVE Sensitive     LEVOFLOXACIN 4 INTERMEDIATE Intermediate     VANCOMYCIN <=0.12 SENSITIVE Sensitive     * VIRIDANS STREPTOCOCCUS  Blood Culture ID Panel (Reflexed)     Status: Abnormal   Collection Time: 04/22/23  9:53 PM  Result Value Ref Range Status   Enterococcus faecalis NOT DETECTED NOT DETECTED Final   Enterococcus Faecium NOT DETECTED NOT DETECTED Final   Listeria monocytogenes NOT DETECTED NOT DETECTED Final   Staphylococcus species NOT DETECTED NOT DETECTED Final   Staphylococcus aureus (BCID) NOT DETECTED NOT DETECTED Final   Staphylococcus epidermidis NOT DETECTED NOT DETECTED Final   Staphylococcus lugdunensis NOT DETECTED NOT DETECTED Final  Streptococcus species DETECTED (A) NOT DETECTED Final    Comment: Not Enterococcus species, Streptococcus agalactiae, Streptococcus pyogenes, or Streptococcus pneumoniae. CRITICAL RESULT CALLED TO, READ BACK BY AND VERIFIED WITH: Abe People [id number] @ 2120 FH     Streptococcus agalactiae NOT DETECTED NOT DETECTED Final   Streptococcus pneumoniae NOT DETECTED NOT DETECTED Final   Streptococcus pyogenes NOT DETECTED NOT DETECTED Final   A.calcoaceticus-baumannii NOT DETECTED NOT DETECTED Final   Bacteroides fragilis NOT DETECTED NOT DETECTED Final   Enterobacterales NOT DETECTED NOT DETECTED Final   Enterobacter cloacae complex NOT DETECTED NOT DETECTED Final   Escherichia coli NOT DETECTED NOT DETECTED Final   Klebsiella aerogenes NOT DETECTED NOT DETECTED Final   Klebsiella oxytoca NOT DETECTED NOT DETECTED Final   Klebsiella pneumoniae NOT DETECTED NOT DETECTED Final   Proteus species NOT DETECTED NOT DETECTED Final   Salmonella species NOT DETECTED NOT DETECTED Final   Serratia marcescens NOT DETECTED NOT DETECTED Final   Haemophilus influenzae NOT DETECTED NOT DETECTED Final   Neisseria meningitidis NOT DETECTED NOT DETECTED Final   Pseudomonas aeruginosa NOT DETECTED NOT DETECTED Final   Stenotrophomonas  maltophilia NOT DETECTED NOT DETECTED Final   Candida albicans NOT DETECTED NOT DETECTED Final   Candida auris NOT DETECTED NOT DETECTED Final   Candida glabrata NOT DETECTED NOT DETECTED Final   Candida krusei NOT DETECTED NOT DETECTED Final   Candida parapsilosis NOT DETECTED NOT DETECTED Final   Candida tropicalis NOT DETECTED NOT DETECTED Final   Cryptococcus neoformans/gattii NOT DETECTED NOT DETECTED Final    Comment: Performed at [facility], [address] [place], [place] [address]  Culture, blood (Routine X 2) w Reflex to ID Panel     Status: Abnormal   Collection Time: 04/22/23  9:54 PM   Specimen: BLOOD LEFT FOREARM  Result Value Ref Range Status   Specimen Description   Final    BLOOD LEFT FOREARM Performed at [facility], [address], [place], [place] [address]    Special Requests   Final    BOTTLES DRAWN AEROBIC ONLY Blood Culture results may not be optimal due to an inadequate volume of blood received in culture bottles Performed at [facility], [address] [place], [place] [address]    Culture  Setup Time   Final    GRAM POSITIVE COCCI IN CLUSTERS AEROBIC BOTTLE ONLY CRITICAL RESULT CALLED TO, READ BACK BY AND VERIFIED WITH: PHARMD NCarin Hock [id number] @ 2120 FH     Culture (A)  Final    STAPHYLOCOCCUS HAEMOLYTICUS THE SIGNIFICANCE OF ISOLATING THIS ORGANISM FROM A SINGLE SET OF BLOOD CULTURES WHEN MULTIPLE SETS ARE DRAWN IS UNCERTAIN. PLEASE NOTIFY THE MICROBIOLOGY DEPARTMENT WITHIN ONE WEEK IF SPECIATION AND SENSITIVITIES ARE REQUIRED. Performed at [facility], [address], [place], [place] [address]    Report Status 04/25/2023 FINAL  Final  Respiratory (~20 pathogens) panel by PCR     Status: None   Collection Time: 04/22/23 11:00 PM   Specimen: Nasopharyngeal Swab; Respiratory  Result Value Ref Range Status   Adenovirus NOT DETECTED NOT DETECTED Final   Coronavirus 229E NOT DETECTED NOT DETECTED Final     Comment: (NOTE) The Coronavirus on the Respiratory Panel, DOES NOT test for the novel  Coronavirus (2019 nCoV)    Coronavirus HKU1 NOT DETECTED NOT DETECTED Final   Coronavirus NL63 NOT DETECTED NOT DETECTED Final   Coronavirus OC43 NOT DETECTED NOT DETECTED Final   Metapneumovirus  NOT DETECTED NOT DETECTED Final   Rhinovirus / Enterovirus NOT DETECTED NOT DETECTED Final   Influenza A NOT DETECTED NOT DETECTED Final   Influenza B NOT DETECTED NOT DETECTED Final   Parainfluenza Virus 1 NOT DETECTED NOT DETECTED Final   Parainfluenza Virus 2 NOT DETECTED NOT DETECTED Final   Parainfluenza Virus 3 NOT DETECTED NOT DETECTED Final   Parainfluenza Virus 4 NOT DETECTED NOT DETECTED Final   Respiratory Syncytial Virus NOT DETECTED NOT DETECTED Final   Bordetella pertussis NOT DETECTED NOT DETECTED Final   Bordetella Parapertussis NOT DETECTED NOT DETECTED Final   Chlamydophila pneumoniae NOT DETECTED NOT DETECTED Final   Mycoplasma pneumoniae NOT DETECTED NOT DETECTED Final    Comment: Performed at [facility], [address] [place], [place] [address]  Blood Culture ID Panel (Reflexed)     Status: Abnormal   Collection Time: 04/23/23  9:46 AM  Result Value Ref Range Status   Enterococcus faecalis NOT DETECTED NOT DETECTED Final   Enterococcus Faecium NOT DETECTED NOT DETECTED Final   Listeria monocytogenes NOT DETECTED NOT DETECTED Final   Staphylococcus species DETECTED (A) NOT DETECTED Final    Comment: CRITICAL RESULT CALLED TO, READ BACK BY AND VERIFIED WITH: Abe People [id number] @ 2120 FH     Staphylococcus aureus (BCID) NOT DETECTED NOT DETECTED Final   Staphylococcus epidermidis NOT DETECTED NOT DETECTED Final   Staphylococcus lugdunensis NOT DETECTED NOT DETECTED Final   Streptococcus species NOT DETECTED NOT DETECTED Final   Streptococcus agalactiae NOT DETECTED NOT DETECTED Final   Streptococcus pneumoniae NOT DETECTED NOT DETECTED Final   Streptococcus pyogenes  NOT DETECTED NOT DETECTED Final   A.calcoaceticus-baumannii NOT DETECTED NOT DETECTED Final   Bacteroides fragilis NOT DETECTED NOT DETECTED Final   Enterobacterales NOT DETECTED NOT DETECTED Final   Enterobacter cloacae complex NOT DETECTED NOT DETECTED Final   Escherichia coli NOT DETECTED NOT DETECTED Final   Klebsiella aerogenes NOT DETECTED NOT DETECTED Final   Klebsiella oxytoca NOT DETECTED NOT DETECTED Final   Klebsiella pneumoniae NOT DETECTED NOT DETECTED Final   Proteus species NOT DETECTED NOT DETECTED Final   Salmonella species NOT DETECTED NOT DETECTED Final   Serratia marcescens NOT DETECTED NOT DETECTED Final   Haemophilus influenzae NOT DETECTED NOT DETECTED Final   Neisseria meningitidis NOT DETECTED NOT DETECTED Final   Pseudomonas aeruginosa NOT DETECTED NOT DETECTED Final   Stenotrophomonas maltophilia NOT DETECTED NOT DETECTED Final   Candida albicans NOT DETECTED NOT DETECTED Final   Candida auris NOT DETECTED NOT DETECTED Final   Candida glabrata NOT DETECTED NOT DETECTED Final   Candida krusei NOT DETECTED NOT DETECTED Final   Candida parapsilosis NOT DETECTED NOT DETECTED Final   Candida tropicalis NOT DETECTED NOT DETECTED Final   Cryptococcus neoformans/gattii NOT DETECTED NOT DETECTED Final    Comment: Performed at [facility], [address] [place], [place] [address]  Culture, blood (Routine X 2) w Reflex to ID Panel     Status: None (Preliminary result)   Collection Time: 04/25/23 12:47 PM   Specimen: BLOOD RIGHT ARM  Result Value Ref Range Status   Specimen Description   Final    BLOOD RIGHT ARM Performed at [facility], [address] [place], [place] [address]    Special Requests   Final    AEROBIC BOTTLE ONLY Blood Culture adequate volume Performed at [facility], [address] [place], [place] [address]    Culture  Final    NO GROWTH 2 DAYS Performed at [facility], [address] [place], [place] [address]    Report Status PENDING  Incomplete  Culture, blood (Routine X 2) w Reflex to ID Panel     Status: None (Preliminary result)   Collection Time: 04/25/23 12:47 PM   Specimen: BLOOD RIGHT HAND  Result Value Ref Range Status   Specimen Description   Final    BLOOD RIGHT HAND Performed at [facility], [address] [place], [place] [address]    Special Requests   Final    AEROBIC BOTTLE ONLY Blood Culture adequate volume Performed at [facility], [address] [place], [place] [address]    Culture   Final    NO GROWTH 2 DAYS Performed at [facility], [address], [place], [place] [address]    Report Status PENDING  Incomplete     Radiology Studies: No results found.   Scheduled Meds:  amLODipine  10 mg Oral Daily   insulin aspart  0-6 Units Subcutaneous TID WC   losartan  25 mg Oral Daily   metoprolol tartrate  75 mg Oral BID   thiamine  100 mg Oral Daily   Warfarin - Pharmacist Dosing Inpatient   Does not apply q1600   Continuous Infusions:  piperacillin-tazobactam (ZOSYN)  IV 3.375 g (04/27/23 1101)   vancomycin 1,750 mg (04/26/23 1346)     LOS: 4 days    Time spent:37 min   Alwyn Ren, MD  04/27/2023, 11:07 AM

## 2023-04-27 NOTE — Plan of Care (Signed)

## 2023-04-27 NOTE — Evaluation (Signed)
Physical Therapy Evaluation Patient Details Name: Christian Crawford MRN: [id number] DOB: December 22, 1933 Today's Date: 04/27/2023  History of Present Illness  89 yr old man who presented to [facility] ED on 04/22/2023 with complaints of syncope. He stopped at a convenience store on his way home from [facility], got dizzy and passed out in the store. He did not strike his head. Exam in the ED was nonfocal.   Of note,  pt was just d/c from ED same day d/t being seen for supratherapeutic INR.Marland Kitchen It was reversed with vitamin K. The INR was reduced to 1.5, the patient was discharged on Lovenox  Clinical Impression  Pt admitted with above diagnosis.  Pt is independent at baseline, pt reports he was not getting up or moving much while in ED. Pt denies any dizziness during PT session, denies any other falls other than syncopal episode.  Orthostatic VS done as below and placed in flowsheets also. Pt presents with weakness, decr balnce. Unsteady gait at this time, would benefit from HHPT   BP- Lying 144/73 Pulse- Lying 80  Orthostatic Sitting BP- Sitting 114/73 Pulse- Sitting 91  Orthostatic Standing at 0 minutes BP- Standing at 0 minutes 125/77 Pulse- Standing at 0 minutes 97    Pt currently with functional limitations due to the deficits listed below (see PT Problem List). Pt will benefit from acute skilled PT to increase their independence and safety with mobility to allow discharge.           If plan is discharge home, recommend the following: Help with stairs or ramp for entrance;Assistance with cooking/housework   Can travel by private vehicle        Equipment Recommendations None recommended by PT  Recommendations for Other Services       Functional Status Assessment Patient has had a recent decline in their functional status and demonstrates the ability to make significant improvements in function in a reasonable and predictable amount of time.     Precautions / Restrictions Precautions Precautions:  Fall      Mobility  Bed Mobility Overal bed mobility: Needs Assistance Bed Mobility: Supine to Sit     Supine to sit: Contact guard     General bed mobility comments: incr time, CGA to safely elevate trunk    Transfers Overall transfer level: Needs assistance Equipment used: Rolling walker (2 wheels) Transfers: Sit to/from Stand Sit to Stand: Min assist           General transfer comment: assist to rise and steady; repeated STS x2    Ambulation/Gait Ambulation/Gait assistance: Min assist Gait Distance (Feet): 75 Feet Assistive device: None (rail in hallway-no walker in room) Gait Pattern/deviations: Step-through pattern, Decreased stride length       General Gait Details: pt with  unsteady, requiring min assist, LOB x1  Stairs            Wheelchair Mobility     Tilt Bed    Modified Rankin (Stroke Patients Only)       Balance Overall balance assessment: Needs assistance Sitting-balance support: Feet supported, No upper extremity supported Sitting balance-Leahy Scale: Good     Standing balance support: No upper extremity supported, During functional activity, Single extremity supported Standing balance-Leahy Scale: Poor Standing balance comment: pt with posterior bias initially, able to correct with cues and repeated STS                             Pertinent Vitals/Pain  Pain Assessment Pain Assessment: No/denies pain    Home Living Family/patient expects to be discharged to:: Private residence Living Arrangements: Alone Available Help at Discharge: Family Type of Home: House Home Access: Stairs to enter   Secretary/administrator of Steps: 5   Home Layout: One level Home Equipment: Agricultural consultant (2 wheels);Cane - single point Additional Comments: lives with dtr    Prior Function Prior Level of Function : Independent/Modified Independent             Mobility Comments: denies falls prior to syncopal episode; pt is  normally independent--has RW and cane but does not use ADLs Comments: Independent with ADLs,  does his own  laundry, cooks and cleans     Extremity/Trunk Assessment   Upper Extremity Assessment Upper Extremity Assessment: Overall WFL for tasks assessed    Lower Extremity Assessment Lower Extremity Assessment: RLE deficits/detail;LLE deficits/detail RLE Deficits / Details: grossly 4/5, rapid muscle fatigue LLE Deficits / Details: grossly 4/5, rapid muscle fatigue       Communication      Cognition Arousal: Alert Behavior During Therapy: WFL for tasks assessed/performed Overall Cognitive Status: Within Functional Limits for tasks assessed                                          General Comments      Exercises     Assessment/Plan    PT Assessment Patient needs continued PT services  PT Problem List Decreased strength;Decreased balance;Decreased activity tolerance;Decreased knowledge of use of DME;Decreased mobility       PT Treatment Interventions DME instruction;Therapeutic exercise;Gait training;Functional mobility training;Therapeutic activities;Patient/family education    PT Goals (Current goals can be found in the Care Plan section)  Acute Rehab PT Goals Patient Stated Goal: feel better PT Goal Formulation: With patient Time For Goal Achievement: 05/18/23 Potential to Achieve Goals: Good    Frequency Min 1X/week     Co-evaluation               AM-PAC PT "6 Clicks" Mobility  Outcome Measure Help needed turning from your back to your side while in a flat bed without using bedrails?: A Little Help needed moving from lying on your back to sitting on the side of a flat bed without using bedrails?: A Little Help needed moving to and from a bed to a chair (including a wheelchair)?: A Little Help needed standing up from a chair using your arms (e.g., wheelchair or bedside chair)?: A Little Help needed to walk in hospital room?: A  Little Help needed climbing 3-5 steps with a railing? : A Little 6 Click Score: 18    End of Session Equipment Utilized During Treatment: Gait belt Activity Tolerance: Patient tolerated treatment well Patient left: in bed;with call bell/phone within reach;with bed alarm set Nurse Communication: Mobility status PT Visit Diagnosis: Other abnormalities of gait and mobility (R26.89)    Time: 1212-1232 PT Time Calculation (min) (ACUTE ONLY): 20 min   Charges:   PT Evaluation $PT Eval Low Complexity: 1 Low   PT General Charges $$ ACUTE PT VISIT: 1 Visit         Willadene Mounsey, PT  Acute Rehab Dept ([facility]) [phone]  04/27/2023   [facility] 04/27/2023, 1:00 PM

## 2023-04-27 NOTE — Progress Notes (Signed)
PROGRESS NOTE  Chistopher Crawford AOZ:[id number] DOB: 1933-11-02 DOA: 04/22/2023 PCP: Ellyn Hack, MD  Brief History   The patient is a 89 yr old man who presented to [facility] ED on 04/22/2023 with complaints of syncope. The patient had been discharged to home earlier in the day after a brief admission for a supertherapeutic INR. He was on coumadin for atrial fibrillation, and presented to a spontaneous left chest wall hematoma. It was reversed with vitamin K. The INR was reduced to 1.5, and the patient was discharged on Lovenox. He stopped at a convenience store on his way home, got dizzy and passed out in the store. He did not strike his head. Exam in the ED was nonfocal. The patient did answer questions appropriately. Earlier this month the patient had undergone an MRCP and had visited GI which demonstrated an abnormal gallbladder up to 4.3 cm with circumferential wall thickening, also dilatation of the pancreatic duct and abnormal MRCP. Abdominal ultrasound on 04/22/2023 demonstrated no evidence of acute cholecystitis. CTA of the chest and abdomen demonstrated acute pancreatitis with possibly developing pseudocyst.  GI was consulted and determined that no ERCP was necessary. Recommendation is that the patient follow up with [facility] GI as previously planned as outpatient. EEG and echocardiogram has been ordered.  Pharmacy has been consulted to assist with dosing Vancomycin.   WBC has decreased from 14.1 to 11.0. Blood cultures have been repeated.  A & P  Syncope and collapse The patient has been admitted to a telemetry bed. He will also have an EEG and a echocargiogram. The patient did meet criteria for sepsis upon presentation. He was tachycardic, RR elevated to 23, and there was a leukocytosis of 17.8 WBC. The 17.8 was actually decreased from 21.5 on 04/22/2023. There is a putative source for sepsis in the patient's CTA chest abdomen and pelvis with acute on chronic pancreatitis and possible  pseudocyst and possible cholecystitis. GI and general surgery were consulted. Neither felt that the patient would benefit from further investication or intervention.  Staph bacteremia Will repeat blood cultures tomorrow. Echocardiogram without evidence of vegetations. The patient was recently inpatient and is at risk for MSSA or MRSA bacteremia. This would also explain the patient's presentation meeting sepsis criteria. Will change to vancomycin.  Acute pancreatitis Mild, CT abdomen shows acute on chronic pancreatitis suspected may be just starting. Lipid panel pending. No need for ERCP per GI. Recommendation is for follow up with [facility] GI as outpatient.  Abnormal magnetic resonance cholangiopancreatography (MRCP) Patient is being referred to [facility] GI for evaluation of stricture or mass.  See report below: IMPRESSION: 1. Dilatation of the pancreatic duct measuring up to 0.7 cm, with abrupt caliber change in the pancreatic head neck junction. Diffusely atrophic pancreatic parenchyma distal to this point. Generally normal appearance of the pancreatic head. This appearance has evolved over prior examinations dating back to 07/24/2019 and most likely reflects chronic sequelae of pancreatitis, without discretely visible mass. However, given motion limitations on today's examination consider ERCP to further evaluate for ductal stricture or occult mass. 2. 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 This requires no specific further follow-up given small size and advanced patient age. 3. No acute inflammatory findings in the abdomen at this time. The patient was discussed with GI and General surgery. It is felt that these changes are chronic. No need for intervention. They patient is urged to  follow up with his GI in [facility].   Aortic Atherosclerosis (ICD10-I70.0). Noted.    Leucocytosis Suspect secondary to biliary etiology and/or bacteremia. Pt was empirically started on zosyn. Will transition to augment for PO option for discharge. WBC has decreased from 17.8 to 14.1 Continue to monitor.   Benign essential hypertension Patient continued on metoprolol, losartan, amlodipine and hydralazine.   Paroxysmal atrial fibrillation (HCC) Currently in sinus rhythm. Patient continued on Coumadin per pharmacy protocol.  Anemia Patient is hemoglobin has been trending down from 11.9-8.9 today CT angio chest abdomen and pelvis is negative.  Chronic blood loss through the gut. -Type and screen, IV PPI, stool guaiac and follow.   Elevated INR (Resolved: 04/22/2023) INR/Prothrombin Time subtherapeutic will consult pharmacy for Coumadin management for paroxysmal atrial fibrillation.   Sliding scale insulin regimen.   DVT prophylaxis:  Coumadin  Consults:  None  Advance Care Planning:    Code Status: Prior    Family Communication:  None  Disposition Plan:  TBD   I have seen and examined this patient myself. I have spent 32 minutes in her evaluation and care.  [name], DO [facility] Direct contact: see [email or web address]  7PM-7AM contact night coverage as above 04/27/2023, 11:43 AM  LOS: 4 days   Consultants  Gastroenterology General surgery  Procedures  EEG ordered  Antibiotics   Anti-infectives (From admission, onward)    Start     Dose/Rate Route Frequency Ordered Stop   04/25/23 1400  vancomycin (VANCOREADY) IVPB 1750 mg/350 mL        1,750 mg 175 mL/hr over 120 Minutes Intravenous Every 24 hours 04/25/23 1310     04/22/23 2330  piperacillin-tazobactam (ZOSYN) IVPB 3.375 g        3.375 g 12.5 mL/hr over 240 Minutes Intravenous Every 8 hours 04/22/23 2310          Interval History/Subjective  The patient is resting comfortably. No new complaints.  Objective   Vitals:  Vitals:   04/27/23 0410 04/27/23 1108  BP: (!) 144/67 (!)  144/74  Pulse: 77 (!) 101  Resp: 16 18  Temp: 98.2 F (36.8 C) 98 F (36.7 C)  SpO2: 99% 99%    Exam:  Constitutional:  The patient is awake, alert, and oriented x 3. No acute distress. Respiratory:  No increased work of breathing. No wheezes, rales, or rhonchi No tactile fremitus Cardiovascular:  Regular rate and rhythm No murmurs, ectopy, or gallups. No lateral PMI. No thrills. Abdomen:  Abdomen is soft, non-tender, non-distended No hernias, masses, or organomegaly Normoactive bowel sounds.  Musculoskeletal:  No cyanosis, clubbing, or edema Skin:  No rashes, lesions, ulcers palpation of skin: no induration or nodules Neurologic:  CN 2-12 intact Sensation all 4 extremities intact Psychiatric:  Mental status Mood, affect appropriate Orientation to person, place, time  judgment and insight appear intact  I have personally reviewed the following:   Today's Data   Vitals:   04/27/23 0410 04/27/23 1108  BP: (!) 144/67 (!) 144/74  Pulse: 77 (!) 101  Resp: 16 18  Temp: 98.2 F (36.8 C) 98 F (36.7 C)  SpO2: 99% 99%     Lab Data  CBC    Component Value Date/Time   WBC 12.4 (H) 04/27/2023 0539   RBC 2.81 (L) 04/27/2023 0539   HGB 7.9 (L) 04/27/2023 0539   HCT 24.9 (L) 04/27/2023 0539   PLT 559 (H) 04/27/2023 0539   MCV 88.6 04/27/2023 0539   MCH 28.1 04/27/2023 0539  MCHC 31.7 04/27/2023 0539   RDW 16.5 (H) 04/27/2023 0539   LYMPHSABS 2.9 04/27/2023 0539   MONOABS 0.9 04/27/2023 0539   EOSABS 0.4 04/27/2023 0539   BASOSABS 0.1 04/27/2023 0539      Latest Ref Rng & Units 04/27/2023    5:39 AM 04/26/2023    7:00 AM 04/25/2023    9:20 AM  BMP  Glucose 70 - 99 mg/dL 454  098  119   BUN 8 - 23 mg/dL 8  10  11    Creatinine 0.61 - 1.24 mg/dL 1.47  8.29  5.62   Sodium 135 - 145 mmol/L 136  138  141   Potassium 3.5 - 5.1 mmol/L 3.8  3.9  4.1   Chloride 98 - 111 mmol/L 108  108  111   CO2 22 - 32 mmol/L 21  22  22    Calcium 8.9 - 10.3 mg/dL 8.2  8.1   8.4      Micro Data   Results for orders placed or performed during the hospital encounter of 04/22/23  Culture, blood (Routine X 2) w Reflex to ID Panel     Status: Abnormal   Collection Time: 04/22/23  9:53 PM   Specimen: BLOOD RIGHT FOREARM  Result Value Ref Range Status   Specimen Description   Final    BLOOD RIGHT FOREARM Performed at [facility], [address] [place], [place] [address]    Special Requests   Final    BOTTLES DRAWN AEROBIC AND ANAEROBIC Blood Culture results may not be optimal due to an inadequate volume of blood received in culture bottles Performed at [facility], [address] [place], [place] [address]    Culture  Setup Time   Final    GRAM POSITIVE COCCI IN CHAINS AEROBIC BOTTLE ONLY CRITICAL RESULT CALLED TO, READ BACK BY AND VERIFIED WITH: Abe People [id number] @ 2120 FH  Performed at [facility], [address] [place], [place] [address]    Culture VIRIDANS STREPTOCOCCUS (A)  Final   Report Status 04/26/2023 FINAL  Final   Organism ID, Bacteria VIRIDANS STREPTOCOCCUS  Final      Susceptibility   Viridans streptococcus - MIC*    PENICILLIN <=0.06 SENSITIVE Sensitive     CEFTRIAXONE <=0.12 SENSITIVE Sensitive     LEVOFLOXACIN 4 INTERMEDIATE Intermediate     VANCOMYCIN <=0.12 SENSITIVE Sensitive     * VIRIDANS STREPTOCOCCUS  Blood Culture ID Panel (Reflexed)     Status: Abnormal   Collection Time: 04/22/23  9:53 PM  Result Value Ref Range Status   Enterococcus faecalis NOT DETECTED NOT DETECTED Final   Enterococcus Faecium NOT DETECTED NOT DETECTED Final   Listeria monocytogenes NOT DETECTED NOT DETECTED Final   Staphylococcus species NOT DETECTED NOT DETECTED Final   Staphylococcus aureus (BCID) NOT DETECTED NOT DETECTED Final   Staphylococcus epidermidis NOT DETECTED NOT DETECTED Final   Staphylococcus lugdunensis NOT DETECTED NOT DETECTED Final   Streptococcus species DETECTED (A) NOT DETECTED Final     Comment: Not Enterococcus species, Streptococcus agalactiae, Streptococcus pyogenes, or Streptococcus pneumoniae. CRITICAL RESULT CALLED TO, READ BACK BY AND VERIFIED WITH: Abe People [id number] @ 2120 FH     Streptococcus agalactiae NOT DETECTED NOT DETECTED Final   Streptococcus pneumoniae NOT DETECTED NOT DETECTED Final   Streptococcus pyogenes NOT DETECTED NOT DETECTED Final   A.calcoaceticus-baumannii NOT DETECTED NOT DETECTED Final   Bacteroides fragilis NOT DETECTED NOT DETECTED Final   Enterobacterales  NOT DETECTED NOT DETECTED Final   Enterobacter cloacae complex NOT DETECTED NOT DETECTED Final   Escherichia coli NOT DETECTED NOT DETECTED Final   Klebsiella aerogenes NOT DETECTED NOT DETECTED Final   Klebsiella oxytoca NOT DETECTED NOT DETECTED Final   Klebsiella pneumoniae NOT DETECTED NOT DETECTED Final   Proteus species NOT DETECTED NOT DETECTED Final   Salmonella species NOT DETECTED NOT DETECTED Final   Serratia marcescens NOT DETECTED NOT DETECTED Final   Haemophilus influenzae NOT DETECTED NOT DETECTED Final   Neisseria meningitidis NOT DETECTED NOT DETECTED Final   Pseudomonas aeruginosa NOT DETECTED NOT DETECTED Final   Stenotrophomonas maltophilia NOT DETECTED NOT DETECTED Final   Candida albicans NOT DETECTED NOT DETECTED Final   Candida auris NOT DETECTED NOT DETECTED Final   Candida glabrata NOT DETECTED NOT DETECTED Final   Candida krusei NOT DETECTED NOT DETECTED Final   Candida parapsilosis NOT DETECTED NOT DETECTED Final   Candida tropicalis NOT DETECTED NOT DETECTED Final   Cryptococcus neoformans/gattii NOT DETECTED NOT DETECTED Final    Comment: Performed at [facility], [address], [place], [place] [address]  Culture, blood (Routine X 2) w Reflex to ID Panel     Status: Abnormal   Collection Time: 04/22/23  9:54 PM   Specimen: BLOOD LEFT FOREARM  Result Value Ref Range Status   Specimen Description   Final    BLOOD LEFT  FOREARM Performed at [facility], [address], [place], [place] [address]    Special Requests   Final    BOTTLES DRAWN AEROBIC ONLY Blood Culture results may not be optimal due to an inadequate volume of blood received in culture bottles Performed at [facility], [address] [place], [place] [address]    Culture  Setup Time   Final    GRAM POSITIVE COCCI IN CLUSTERS AEROBIC BOTTLE ONLY CRITICAL RESULT CALLED TO, READ BACK BY AND VERIFIED WITH: PHARMD NCarin Hock [id number] @ 2120 FH     Culture (A)  Final    STAPHYLOCOCCUS HAEMOLYTICUS THE SIGNIFICANCE OF ISOLATING THIS ORGANISM FROM A SINGLE SET OF BLOOD CULTURES WHEN MULTIPLE SETS ARE DRAWN IS UNCERTAIN. PLEASE NOTIFY THE MICROBIOLOGY DEPARTMENT WITHIN ONE WEEK IF SPECIATION AND SENSITIVITIES ARE REQUIRED. Performed at [facility], [address] [place], [place] [address]    Report Status 04/25/2023 FINAL  Final  Respiratory (~20 pathogens) panel by PCR     Status: None   Collection Time: 04/22/23 11:00 PM   Specimen: Nasopharyngeal Swab; Respiratory  Result Value Ref Range Status   Adenovirus NOT DETECTED NOT DETECTED Final   Coronavirus 229E NOT DETECTED NOT DETECTED Final    Comment: (NOTE) The Coronavirus on the Respiratory Panel, DOES NOT test for the novel  Coronavirus (2019 nCoV)    Coronavirus HKU1 NOT DETECTED NOT DETECTED Final   Coronavirus NL63 NOT DETECTED NOT DETECTED Final   Coronavirus OC43 NOT DETECTED NOT DETECTED Final   Metapneumovirus NOT DETECTED NOT DETECTED Final   Rhinovirus / Enterovirus NOT DETECTED NOT DETECTED Final   Influenza A NOT DETECTED NOT DETECTED Final   Influenza B NOT DETECTED NOT DETECTED Final   Parainfluenza Virus 1 NOT DETECTED NOT DETECTED Final   Parainfluenza Virus 2 NOT DETECTED NOT DETECTED Final   Parainfluenza Virus 3 NOT DETECTED NOT DETECTED Final   Parainfluenza Virus 4 NOT DETECTED NOT DETECTED Final   Respiratory Syncytial Virus NOT  DETECTED NOT DETECTED Final   Bordetella pertussis  NOT DETECTED NOT DETECTED Final   Bordetella Parapertussis NOT DETECTED NOT DETECTED Final   Chlamydophila pneumoniae NOT DETECTED NOT DETECTED Final   Mycoplasma pneumoniae NOT DETECTED NOT DETECTED Final    Comment: Performed at [facility], [address], [place], [place] [address]  Blood Culture ID Panel (Reflexed)     Status: Abnormal   Collection Time: 04/23/23  9:46 AM  Result Value Ref Range Status   Enterococcus faecalis NOT DETECTED NOT DETECTED Final   Enterococcus Faecium NOT DETECTED NOT DETECTED Final   Listeria monocytogenes NOT DETECTED NOT DETECTED Final   Staphylococcus species DETECTED (A) NOT DETECTED Final    Comment: CRITICAL RESULT CALLED TO, READ BACK BY AND VERIFIED WITH: Abe People [id number] @ 2120 FH     Staphylococcus aureus (BCID) NOT DETECTED NOT DETECTED Final   Staphylococcus epidermidis NOT DETECTED NOT DETECTED Final   Staphylococcus lugdunensis NOT DETECTED NOT DETECTED Final   Streptococcus species NOT DETECTED NOT DETECTED Final   Streptococcus agalactiae NOT DETECTED NOT DETECTED Final   Streptococcus pneumoniae NOT DETECTED NOT DETECTED Final   Streptococcus pyogenes NOT DETECTED NOT DETECTED Final   A.calcoaceticus-baumannii NOT DETECTED NOT DETECTED Final   Bacteroides fragilis NOT DETECTED NOT DETECTED Final   Enterobacterales NOT DETECTED NOT DETECTED Final   Enterobacter cloacae complex NOT DETECTED NOT DETECTED Final   Escherichia coli NOT DETECTED NOT DETECTED Final   Klebsiella aerogenes NOT DETECTED NOT DETECTED Final   Klebsiella oxytoca NOT DETECTED NOT DETECTED Final   Klebsiella pneumoniae NOT DETECTED NOT DETECTED Final   Proteus species NOT DETECTED NOT DETECTED Final   Salmonella species NOT DETECTED NOT DETECTED Final   Serratia marcescens NOT DETECTED NOT DETECTED Final   Haemophilus influenzae NOT DETECTED NOT DETECTED Final   Neisseria meningitidis NOT DETECTED  NOT DETECTED Final   Pseudomonas aeruginosa NOT DETECTED NOT DETECTED Final   Stenotrophomonas maltophilia NOT DETECTED NOT DETECTED Final   Candida albicans NOT DETECTED NOT DETECTED Final   Candida auris NOT DETECTED NOT DETECTED Final   Candida glabrata NOT DETECTED NOT DETECTED Final   Candida krusei NOT DETECTED NOT DETECTED Final   Candida parapsilosis NOT DETECTED NOT DETECTED Final   Candida tropicalis NOT DETECTED NOT DETECTED Final   Cryptococcus neoformans/gattii NOT DETECTED NOT DETECTED Final    Comment: Performed at [facility], [address] [place], [place] [address]  Culture, blood (Routine X 2) w Reflex to ID Panel     Status: None (Preliminary result)   Collection Time: 04/25/23 12:47 PM   Specimen: BLOOD RIGHT ARM  Result Value Ref Range Status   Specimen Description   Final    BLOOD RIGHT ARM Performed at [facility], [address] [place], [place] [address]    Special Requests   Final    AEROBIC BOTTLE ONLY Blood Culture adequate volume Performed at [facility], [address] [place], [place] [address]    Culture   Final    NO GROWTH 2 DAYS Performed at [facility], [address] [place], [place] [address]    Report Status PENDING  Incomplete  Culture, blood (Routine X 2) w Reflex to ID Panel     Status: None (Preliminary result)   Collection Time: 04/25/23 12:47 PM   Specimen: BLOOD RIGHT HAND  Result Value Ref Range Status   Specimen Description   Final    BLOOD RIGHT HAND Performed at [facility],  [address], [place], [place] [address]    Special Requests   Final    AEROBIC BOTTLE ONLY Blood Culture adequate volume Performed at [facility], [address] [place], [place] [address]    Culture   Final    NO GROWTH 2 DAYS Performed at [facility], [address], [place], [place] [address]    Report Status PENDING  Incomplete   Imaging   CTA chest abdomen and pelvis.  Cardiology Data  EKG  Scheduled Meds:  amLODipine  10 mg Oral Daily   insulin aspart  0-6 Units Subcutaneous TID WC   losartan  25 mg Oral Daily   metoprolol tartrate  75 mg Oral BID   thiamine  100 mg Oral Daily   Warfarin - Pharmacist Dosing Inpatient   Does not apply q1600   Continuous Infusions:  piperacillin-tazobactam (ZOSYN)  IV 3.375 g (04/27/23 1101)   vancomycin 1,750 mg (04/26/23 1346)    Principal Problem:   Syncope and collapse Active Problems:   Acute pancreatitis   Abnormal magnetic resonance cholangiopancreatography (MRCP)   Leucocytosis   Benign essential hypertension   Paroxysmal atrial fibrillation (HCC)   Anemia   Abnormal CT of the abdomen   Idiopathic chronic pancreatitis (HCC)   LOS: 4 days

## 2023-04-27 NOTE — Progress Notes (Signed)
PHARMACY - ANTICOAGULATION CONSULT NOTE  Pharmacy Consult for Coumadin Indication: atrial fibrillation  Allergies  Allergen Reactions   Lisinopril Cough    Patient Measurements: Height: 6\' 1"  (185.4 cm) Weight: 79 kg (174 lb 2.6 oz) IBW/kg (Calculated) : 79.9  Vital Signs: Temp: 98 F (36.7 C) (02/02 1108) Temp Source: Oral (02/02 1108) BP: 144/74 (02/02 1108) Pulse Rate: 101 (02/02 1108)  Labs: Recent Labs    04/25/23 0523 04/25/23 0920 04/25/23 0920 04/26/23 0700 04/27/23 0539  HGB  --  7.9*   < > 7.6* 7.9*  HCT  --  25.3*  --  24.1* 24.9*  PLT  --  602*  --  556* 559*  LABPROT 28.8*  --   --  29.2* 31.0*  INR 2.7*  --   --  2.7* 3.0*  CREATININE  --  0.63  --  0.65 0.63   < > = values in this interval not displayed.    Estimated Creatinine Clearance: 71.3 mL/min (by C-G formula based on SCr of 0.63 mg/dL).   Medical History: Past Medical History:  Diagnosis Date   A-fib [facility])    Acute alcoholic pancreatitis 07/24/2019   Diverticulitis 11/13/2021   Heavy alcohol use 07/24/2019   Hematuria 11/14/2021   Hypertension    TIA (transient ischemic attack)     Assessment: AC/Heme: Coumadin PTA for afib - Hgb low/stable, Plts elevated  - 1/27:  INR is 7.8,  Vit K 5mg  IV x 1 given in ED.  Hgb 11.9 to 9.0 - 1/28: INR 1.5, Hgb down to 8.4.  Warfarin 10mg   - 1/29: INR 1.9, Coumadin 5mg  ordered- NOT CHARTED (SZP) - 1/30: INR 2.1, Coumadin 6mg  po x 1  - 1/31: INR 2.7 increasing quickly, Coumadin 1 mg x 1 - 2/1:   INR 2.7 stabilizing, Coumadin 4 mg x 1 - 2/2:   INR 3.0   Goal of Therapy:  INR 2-3 Monitor platelets by anticoagulation protocol: Yes   Plan:  Repeat Coumadin 4 mg po x 1 today Daily INR   Thank you for allowing pharmacy to be a part of this patient's care.  Selinda Eon, PharmD, BCPS Clinical Pharmacist [place] 04/27/2023 11:38 AM

## 2023-04-28 DIAGNOSIS — R55 Syncope and collapse: Secondary | ICD-10-CM | POA: Diagnosis not present

## 2023-04-28 LAB — CBC
HCT: 26.8 % — ABNORMAL LOW (ref 39.0–52.0)
Hemoglobin: 8.4 g/dL — ABNORMAL LOW (ref 13.0–17.0)
MCH: 27.6 pg (ref 26.0–34.0)
MCHC: 31.3 g/dL (ref 30.0–36.0)
MCV: 88.2 fL (ref 80.0–100.0)
Platelets: 604 10*3/uL — ABNORMAL HIGH (ref 150–400)
RBC: 3.04 MIL/uL — ABNORMAL LOW (ref 4.22–5.81)
RDW: 17 % — ABNORMAL HIGH (ref 11.5–15.5)
WBC: 17.3 10*3/uL — ABNORMAL HIGH (ref 4.0–10.5)
nRBC: 0 % (ref 0.0–0.2)

## 2023-04-28 LAB — GLUCOSE, CAPILLARY
Glucose-Capillary: 137 mg/dL — ABNORMAL HIGH (ref 70–99)
Glucose-Capillary: 110 mg/dL — ABNORMAL HIGH (ref 70–99)
Glucose-Capillary: 151 mg/dL — ABNORMAL HIGH (ref 70–99)
Glucose-Capillary: 153 mg/dL — ABNORMAL HIGH (ref 70–99)

## 2023-04-28 LAB — PROTIME-INR
INR: 2.8 — ABNORMAL HIGH (ref 0.8–1.2)
Prothrombin Time: 30 s — ABNORMAL HIGH (ref 11.4–15.2)

## 2023-04-28 LAB — COMPREHENSIVE METABOLIC PANEL
ALT: 10 U/L (ref 0–44)
AST: 18 U/L (ref 15–41)
Albumin: 2.8 g/dL — ABNORMAL LOW (ref 3.5–5.0)
Alkaline Phosphatase: 67 U/L (ref 38–126)
Anion gap: 7 (ref 5–15)
BUN: 8 mg/dL (ref 8–23)
CO2: 21 mmol/L — ABNORMAL LOW (ref 22–32)
Calcium: 8.4 mg/dL — ABNORMAL LOW (ref 8.9–10.3)
Chloride: 109 mmol/L (ref 98–111)
Creatinine, Ser: 0.67 mg/dL (ref 0.61–1.24)
GFR, Estimated: >60 mL/min (ref >60)
Glucose, Bld: 106 mg/dL — ABNORMAL HIGH (ref 70–99)
Potassium: 3.9 mmol/L (ref 3.5–5.1)
Sodium: 137 mmol/L (ref 135–145)
Total Bilirubin: 1.2 mg/dL (ref 0.0–1.2)
Total Protein: 6 g/dL — ABNORMAL LOW (ref 6.5–8.1)

## 2023-04-28 LAB — IRON AND TIBC
Iron: 21 ug/dL — ABNORMAL LOW (ref 45–182)
Saturation Ratios: 14 % — ABNORMAL LOW (ref 17.9–39.5)
TIBC: 151 ug/dL — ABNORMAL LOW (ref 250–450)
UIBC: 130 ug/dL

## 2023-04-28 LAB — FERRITIN: Ferritin: 378 ng/mL — ABNORMAL HIGH (ref 24–336)

## 2023-04-28 LAB — FOLATE: Folate: 12.3 ng/mL (ref >5.9)

## 2023-04-28 LAB — VITAMIN B12: Vitamin B-12: 697 pg/mL (ref 180–914)

## 2023-04-28 LAB — RETICULOCYTES
Immature Retic Fract: 24.8 % — ABNORMAL HIGH (ref 2.3–15.9)
RBC.: 3.01 MIL/uL — ABNORMAL LOW (ref 4.22–5.81)
Retic Count, Absolute: 162.5 10*3/uL (ref 19.0–186.0)
Retic Ct Pct: 5.4 % — ABNORMAL HIGH (ref 0.4–3.1)

## 2023-04-28 MED ORDER — WARFARIN SODIUM 4 MG PO TABS: 4.0000 mg | ORAL_TABLET | Freq: Once | ORAL | Status: DC

## 2023-04-28 MED ORDER — WARFARIN SODIUM 4 MG PO TABS
4.0000 mg | ORAL_TABLET | Freq: Once | ORAL | Status: AC
  Administered 2023-04-28: 4 mg via ORAL
  Filled 2023-04-28: qty 1

## 2023-04-28 NOTE — TOC Initial Note (Signed)
Transition of Care [facility]) - Initial/Assessment Note    Patient Details  Name: Christian Crawford MRN: [id number] Date of Birth: [birth year]  Transition of Care [facility]) CM/SW Contact:    Otelia Santee, LCSW Phone Number: 04/28/2023, 10:55 AM  Clinical Narrative:                 Pt from home where he is typically independent with ambulation and ADL's. Pt shares he has a RW and canes at home but, does not typically use them. Pt has had HH in the past and is agreeable to HHPT at discharge. No HHA preference. HHPT has been arranged with [facility]. HH order will need to be placed prior to discharge.   Expected Discharge Plan: Home w Home Health Services Barriers to Discharge: No Barriers Identified   Patient Goals and CMS Choice Patient states their goals for this hospitalization and ongoing recovery are:: To return home CMS Medicare.gov Compare Post Acute Care list provided to:: Patient Choice offered to / list presented to : Patient [place] ownership interest in [facility].provided to::  (NA)    Expected Discharge Plan and Services In-house Referral: Clinical Social Work Discharge Planning Services: NA Post Acute Care Choice: Home Health Living arrangements for the past 2 months: Single Family Home                 DME Arranged: N/A DME Agency: NA       HH Arranged: PT HH AgencyHotel manager Home Health Care Date HH Agency Contacted: 04/28/23 Time HH Agency Contacted: [id number] Representative spoke with at [facility] Agency: Cindie  Prior Living Arrangements/Services Living arrangements for the past 2 months: Single Family Home Lives with:: Self Patient language and need for interpreter reviewed:: Yes Do you feel safe going back to the place where you live?: Yes      Need for Family Participation in Patient Care: No (Comment) Care giver support system in place?: No (comment) Current home services: DME Gilmer Mor, RW) Criminal Activity/Legal Involvement Pertinent to Current  Situation/Hospitalization: No - Comment as needed  Activities of Daily Living      Permission Sought/Granted Permission sought to share information with : Facility Industrial/product designer granted to share information with : Yes, Verbal Permission Granted     Permission granted to share info w AGENCY: HHA        Emotional Assessment Appearance:: Appears stated age Attitude/Demeanor/Rapport: Engaged Affect (typically observed): Accepting Orientation: : Oriented to Self, Oriented to Place, Oriented to  Time, Oriented to Situation Alcohol / Substance Use: Not Applicable Psych Involvement: No (comment)  Admission diagnosis:  Syncope and collapse [R55] Patient Active Problem List   Diagnosis Date Noted   Abnormal CT of the abdomen 04/23/2023   Idiopathic chronic pancreatitis (HCC) 04/23/2023   Syncope and collapse 04/22/2023   Leucocytosis 04/22/2023   Abnormal magnetic resonance cholangiopancreatography (MRCP) 04/22/2023   Anemia 04/22/2023   Acute pancreatitis 11/13/2021   Paroxysmal atrial fibrillation (HCC) 01/16/2018   Benign essential hypertension 12/22/2014   PCP:  Ellyn Hack, MD Pharmacy:   [id number] - [address] ([place]), [place] - [address] [place] ([place]) [place] [address] Phone: [phone] Fax: [phone]     Social Drivers of Health (SDOH) Social History: SDOH Screenings   Food Insecurity: No Food Insecurity (04/23/2023)  Housing: Low Risk  (04/23/2023)  Transportation Needs: No Transportation Needs (04/23/2023)  Utilities: Not At Risk (04/23/2023)  Social Connections: Moderately Isolated (04/23/2023)  Tobacco Use:  Medium Risk (04/22/2023)   SDOH Interventions:     Readmission Risk Interventions    04/24/2023    2:27 PM  Readmission Risk Prevention Plan  Transportation Screening Complete  PCP or Specialist Appt within 5-7 Days Complete  Home Care Screening Complete  Medication Review (RN CM)  Complete

## 2023-04-28 NOTE — Progress Notes (Signed)
PHARMACY - ANTICOAGULATION CONSULT NOTE  Pharmacy Consult for Coumadin Indication: atrial fibrillation  Allergies  Allergen Reactions   Lisinopril Cough    Patient Measurements: Height: 6\' 1"  (185.4 cm) Weight: 79 kg (174 lb 2.6 oz) IBW/kg (Calculated) : 79.9  Vital Signs: Temp: 97.9 F (36.6 C) (02/03 1202) Temp Source: Oral (02/03 1202) BP: 130/79 (02/03 1202) Pulse Rate: 86 (02/03 1202)  Labs: Recent Labs    04/26/23 0700 04/27/23 0539 04/28/23 0521  HGB 7.6* 7.9* 8.4*  HCT 24.1* 24.9* 26.8*  PLT 556* 559* 604*  LABPROT 29.2* 31.0* 30.0*  INR 2.7* 3.0* 2.8*  CREATININE 0.65 0.63 0.67    Estimated Creatinine Clearance: 71.3 mL/min (by C-G formula based on SCr of 0.67 mg/dL).   Assessment: 89 yr male discharged earlier today for hospitalization due to supratherapeutic INR and hematoma and readmitted with sepsis with suspected biliary source.  PTA Warfarin regimen: 10mg  daily except 5mg  MWF; last dose 1/27    Significant events: - 1/27: INR is 7.8, Vit K 5 mg IV x 1  - 1/29: INR 1.9, Coumadin 5mg  ordered - NOT CHARTED (SZP)  Goal of Therapy:  INR 2-3 Monitor platelets by anticoagulation protocol: Yes   Plan:  Warfarin 4 mg PO x 1 again tonight Daily INR; CBC q72 hr while on warfarin Monitor for s/s bleeding or thrombosis   Thank you for allowing pharmacy to be a part of this patient's care.  Bernadene Person, PharmD, BCPS [phone] 04/28/2023, 3:29 PM

## 2023-04-28 NOTE — Hospital Course (Addendum)
89 yr old man who presented to [facility] ED on 04/22/2023 with complaints of syncope after he had been discharged to home earlier in the day after a brief admission for a supertherapeutic INR. He was on coumadin for atrial fibrillation, and presented to a spontaneous left chest wall hematoma. It was reversed with vitamin K. The INR was reduced to 1.5, and the patient was discharged on Lovenox. He stopped at a convenience store on his way home, got dizzy and passed out in the store. He did not strike his head. Exam in the ED was nonfocal. The patient did answer questions appropriately. Earlier this month the patient had undergone an MRCP and had visited GI which demonstrated an abnormal gallbladder up to 4.3 cm with circumferential wall thickening, also dilatation of the pancreatic duct and abnormal MRCP. Abdominal ultrasound on 04/22/2023 demonstrated no evidence of acute cholecystitis. CTA of the chest and abdomen demonstrated acute pancreatitis with possibly developing pseudocyst. GI was consulted and determined that no ERCP was necessary. Recommendation is that the patient follow up with [facility] GI as previously planned as outpatient. EEG and echocardiogram obtained including CT head CT chest abdomen pelvis as above with no acute finding EF 65-70% G1 DD, EEG without seizures. Patient had bacteremia in the blood culture and managed antibiotics pending further culture sensitivity. Patient was also treated for acute pancreatitis on chronic pancreatitis patient CT scan.

## 2023-04-28 NOTE — Progress Notes (Signed)
PROGRESS NOTE Christian Crawford  VHQ:[id number] DOB: Jun 04, 1933 DOA: 04/22/2023 PCP: Ellyn Hack, MD  Brief Narrative/Hospital Course: 89 yr old man who presented to [facility] ED on 04/22/2023 with complaints of syncope after he had been discharged to home earlier in the day after a brief admission for a supertherapeutic INR. He was on coumadin for atrial fibrillation, and presented to a spontaneous left chest wall hematoma. It was reversed with vitamin K. The INR was reduced to 1.5, and the patient was discharged on Lovenox. He stopped at a convenience store on his way home, got dizzy and passed out in the store. He did not strike his head. Exam in the ED was nonfocal. The patient did answer questions appropriately. Earlier this month the patient had undergone an MRCP and had visited GI which demonstrated an abnormal gallbladder up to 4.3 cm with circumferential wall thickening, also dilatation of the pancreatic duct and abnormal MRCP. Abdominal ultrasound on 04/22/2023 demonstrated no evidence of acute cholecystitis. CTA of the chest and abdomen demonstrated acute pancreatitis with possibly developing pseudocyst. GI was consulted and determined that no ERCP was necessary. Recommendation is that the patient follow up with [facility] GI as previously planned as outpatient. EEG and echocardiogram obtained including CT head CT chest abdomen pelvis as above with no acute finding EF 65-70% G1 DD, EEG without seizures. Patient had bacteremia in the blood culture and managed antibiotics pending further culture sensitivity. Patient was also treated for acute pancreatitis on chronic pancreatitis patient CT scan.    Subjective: Seen and examined.  Daughter at the bedside Patient has no complaints no nausea vomiting or abdominal pain, tolerating diet, last bowel movement 2/2 Overnight heart rate 90s to 100, BP stable, labs reviewed with a stable CMP albumin slightly low 2.8, low iron and anemia panel, labs with  leukocytosis 12>17k  Assessment and Plan: Principal Problem:   Syncope and collapse Active Problems:   Acute pancreatitis   Abnormal magnetic resonance cholangiopancreatography (MRCP)   Leucocytosis   Benign essential hypertension   Paroxysmal atrial fibrillation (HCC)   Anemia   Abnormal CT of the abdomen   Idiopathic chronic pancreatitis (HCC)   Syncope: exact etiology is unknown but could be related to multiple multifactorial reasons.  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  Positive blood culture as below: Consistent with contamination. BCID with Staphylococcus, from 1/29- grew staph hemolyticus likely contamination and Streptococcus from 1/28> Peridin-C Streptococcus final. Repeat blood culture 1/31 no growth to date.  Discussed with Dr. Daiva Eves from ID-it is felt to be contamination okay to discontinue antibiotics.    Acute pancreatitis on chronic pancreatitis by CT scan: No abdominal pain currently and tolerating diet, lipase back to normal.  MRCP showed : Dilatation of the pancreatic duct measuring up to 0.7 cm, with abrupt caliber change in the pancreatic head neck junction. Diffusely atrophic pancreatic parenchyma distal to this point.Generally normal appearance of the pancreatic head. This appearancehas evolved over prior examinations dating back to 07/24/2019 andmost likely reflects chronic sequelae of pancreatitis, without discretely visible mass. 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 This requires no specific further follow-up given small size and advanced patient age. No acute inflammatory findings in the abdomen at this time.  Discussed with GI and General surgery. It is felt that these changes are chronic. No need for intervention. They patient is urged to follow up with his GI in [facility] and he has an appointment with GI [facility] on June 20, 2023. That is the earliest appointment family could get. GI recommends to follow-up with [facility] GI  Lipid panel LDL 39 triglycerides 76 cholesterol 86  Leukocytosis: Complains of frequency, check UA, recheck labs in AM.  Monitor -?  From pancreatitis if worsening consider rescan  Essential hypertension: BP is controlled continue on Norvasc hydralazine losartan and metoprolol   PAF: Cont on Coumadin rate controlled with metoprolol-INR therapeutic pharmacy dosing Recent Labs  Lab 04/24/23 0552 04/25/23 0523 04/26/23 0700 04/27/23 0539 04/28/23 0521  INR 2.1* 2.7* 2.7* 3.0* 2.8*    Chronic anemia: hb trended down hemoglobin 7.9 from 11.9 on April 11, 2023 partly related to hemodilution, INR was 7.8 on 04/21/2023, INR stable no evidence of acute bleeding-FOBT unable to be down, hemoglobin trending up at 24 g, anemia panel some iron deficiency  Glaucoma: cont latanoprost eyedrops  DVT prophylaxis: Coumadin Code Status:   Code Status: Full Code Family Communication: plan of care discussed with patient/daughter at bedside. Patient status is: Remains hospitalized because of severity of illness Level of care: Telemetry   Dispo: The patient is from: home            Anticipated disposition: TBD 24 hrs Objective: Vitals last 24 hrs: Vitals:   04/28/23 0553 04/28/23 0850 04/28/23 0851 04/28/23 1202  BP: (!) 151/77 (!) 151/77 (!) 151/77 130/79  Pulse: (!) 102 (!) 102  86  Resp: 18   18  Temp: 99 F (37.2 C)     TempSrc:       SpO2: 100%   99%  Weight:      Height:       Weight change:   Physical Examination: General exam: alert awake,at baseline, older than stated age HEENT:Oral mucosa moist, Ear/Nose WNL grossly Respiratory system: Bilaterally clear BS,no use of accessory muscle Cardiovascular system: S1 & S2 +, No JVD. Gastrointestinal system: Abdomen soft,NT,ND, BS+ Nervous System: Alert, awake, moving all extremities,and following commands. Extremities: LE edema neg,distal peripheral pulses palpable and warm.  Skin: No rashes,no icterus. MSK: Normal muscle bulk,tone, power   Medications reviewed:  Scheduled Meds:  amLODipine  10 mg Oral Daily   insulin aspart  0-6 Units Subcutaneous TID WC   latanoprost  1 drop Both Eyes QHS   losartan  25 mg Oral Daily   metoprolol tartrate  75 mg Oral BID   thiamine  100 mg Oral Daily   Warfarin - Pharmacist Dosing Inpatient   Does not apply q1600   Continuous Infusions:  piperacillin-tazobactam (ZOSYN)  IV 3.375 g (04/28/23 0855)   vancomycin 1,750 mg (04/27/23 1509)    Diet Order             Diet regular Room service appropriate? Yes; Fluid consistency: Thin  Diet effective now                  Intake/Output Summary (Last 24 hours) at 04/28/2023 1216 Last data filed at 04/28/2023 1200 Gross per 24 hour  Intake 1254.55 ml  Output 675  ml  Net 579.55 ml   Net IO Since Admission: 90.47 mL [04/28/23 1216]  Wt Readings from Last 3 Encounters:  04/22/23 79 kg  04/21/23 79.4 kg  04/11/23 79.4 kg     Unresulted Labs (From admission, onward)     Start     Ordered   04/29/23 0500  Basic metabolic panel  Daily,   R     Question:  Specimen collection method  Answer:  Lab=Lab collect   04/28/23 1123   04/29/23 0500  CBC  Daily,   R     Question:  Specimen collection method  Answer:  Lab=Lab collect   04/28/23 1123   04/27/23 1139  Occult blood card to lab, stool  Once,   R        04/27/23 1138   04/23/23 0500  Protime-INR  Daily,   R       04/22/23 2320           Data Reviewed: I have personally reviewed following labs and imaging studies CBC: Recent Labs  Lab 04/23/23 0611 04/24/23 0552 04/25/23 0920 04/26/23 0700 04/27/23 0539 04/28/23 0521  WBC 17.8* 14.0* 14.1* 11.0* 12.4* 17.3*  NEUTROABS 13.0* 8.6* 9.2* 6.5 8.1*  --   HGB 8.3* 7.7* 7.9* 7.6* 7.9* 8.4*  HCT 25.8* 24.3* 25.3* 24.1* 24.9* 26.8*  MCV 85.7 86.8 88.8 88.3 88.6 88.2  PLT 607* 570* 602* 556* 559* 604*   Basic Metabolic Panel:  Recent Labs  Lab 04/23/23 0611 04/24/23 0552 04/25/23 0920 04/26/23 0700 04/27/23 0539 04/28/23 0521  NA 137 137 141 138 136 137  K 3.5 3.1* 4.1 3.9 3.8 3.9  CL 103 105 111 108 108 109  CO2 22 23 22 22  21* 21*  GLUCOSE 146* 114* 111* 112* 102* 106*  BUN 10 9 11 10 8 8   CREATININE 0.33* 0.63 0.63 0.65 0.63 0.67  CALCIUM 8.6* 8.2* 8.4* 8.1* 8.2* 8.4*  MG 1.6*  --   --   --   --   --   PHOS 2.6  --   --   --   --   --    GFR: Estimated Creatinine Clearance: 71.3 mL/min (by C-G formula based on SCr of 0.67 mg/dL). Liver Function Tests:  Recent Labs  Lab 04/22/23 0406 04/22/23 2043 04/23/23 0611 04/28/23 0521  AST 24 28 25 18   ALT 16 16 16 10   ALKPHOS 86 86 81 67  BILITOT 1.0 1.1 0.9 1.2  PROT 6.1* 6.3* 6.3* 6.0*  ALBUMIN 2.6* 2.7* 2.9* 2.8*   Recent Labs  Lab 04/22/23 1649  LIPASE 49   No results for input(s): "AMMONIA" in the last 168 hours. Coagulation Profile:  Recent Labs  Lab 04/24/23 0552 04/25/23 0523 04/26/23 0700 04/27/23 0539 04/28/23 0521  INR 2.1* 2.7* 2.7* 3.0* 2.8*   No results for input(s): "PROBNP" in the last 168 hours.  No results for input(s): "HGBA1C" in the last 72 hours. Recent Labs  Lab 04/27/23 1136 04/27/23 1651 04/27/23 2058 04/28/23 0814 04/28/23 1159  GLUCAP 122* 103* 105* 137* 110*   No results for input(s): "CHOL", "HDL", "LDLCALC", "TRIG", "CHOLHDL", "LDLDIRECT" in the last 72 hours. No results for input(s): "TSH", "T4TOTAL", "FREET4", "T3FREE",  "THYROIDAB" in the last 72 hours. Sepsis Labs: Recent Labs  Lab 04/22/23 1707  LATICACIDVEN 1.8   Recent Results (from the past 240 hours)  Culture, blood (Routine X 2) w Reflex to ID Panel     Status: Abnormal   Collection Time:  04/22/23  9:53 PM   Specimen: BLOOD RIGHT FOREARM  Result Value Ref Range Status   Specimen Description   Final    BLOOD RIGHT FOREARM Performed at [facility], [address], [place], [place] [address]    Special Requests   Final    BOTTLES DRAWN AEROBIC AND ANAEROBIC Blood Culture results may not be optimal due to an inadequate volume of blood received in culture bottles Performed at [facility], [address], [place], [place] [address]    Culture  Setup Time   Final    GRAM POSITIVE COCCI IN CHAINS AEROBIC BOTTLE ONLY CRITICAL RESULT CALLED TO, READ BACK BY AND VERIFIED WITH: Abe People [id number] @ 2120 FH  Performed at [facility], [address], [place], [place] [address]    Culture VIRIDANS STREPTOCOCCUS (A)  Final   Report Status 04/26/2023 FINAL  Final   Organism ID, Bacteria VIRIDANS STREPTOCOCCUS  Final      Susceptibility   Viridans streptococcus - MIC*    PENICILLIN <=0.06 SENSITIVE Sensitive     CEFTRIAXONE <=0.12 SENSITIVE Sensitive     LEVOFLOXACIN 4 INTERMEDIATE Intermediate     VANCOMYCIN <=0.12 SENSITIVE Sensitive     * VIRIDANS STREPTOCOCCUS  Blood Culture ID Panel (Reflexed)     Status: Abnormal   Collection Time: 04/22/23  9:53 PM  Result Value Ref Range Status   Enterococcus faecalis NOT DETECTED NOT DETECTED Final   Enterococcus Faecium NOT DETECTED NOT DETECTED Final   Listeria monocytogenes NOT DETECTED NOT DETECTED Final   Staphylococcus species NOT DETECTED NOT DETECTED Final   Staphylococcus aureus (BCID) NOT DETECTED NOT DETECTED Final   Staphylococcus epidermidis NOT DETECTED NOT DETECTED Final   Staphylococcus lugdunensis NOT DETECTED NOT DETECTED Final   Streptococcus  species DETECTED (A) NOT DETECTED Final    Comment: Not Enterococcus species, Streptococcus agalactiae, Streptococcus pyogenes, or Streptococcus pneumoniae. CRITICAL RESULT CALLED TO, READ BACK BY AND VERIFIED WITH: Abe People [id number] @ 2120 FH     Streptococcus agalactiae NOT DETECTED NOT DETECTED Final   Streptococcus pneumoniae NOT DETECTED NOT DETECTED Final   Streptococcus pyogenes NOT DETECTED NOT DETECTED Final   A.calcoaceticus-baumannii NOT DETECTED NOT DETECTED Final   Bacteroides fragilis NOT DETECTED NOT DETECTED Final   Enterobacterales NOT DETECTED NOT DETECTED Final   Enterobacter cloacae complex NOT DETECTED NOT DETECTED Final   Escherichia coli NOT DETECTED NOT DETECTED Final   Klebsiella aerogenes NOT DETECTED NOT DETECTED Final   Klebsiella oxytoca NOT DETECTED NOT DETECTED Final   Klebsiella pneumoniae NOT DETECTED NOT DETECTED Final   Proteus species NOT DETECTED NOT DETECTED Final   Salmonella species NOT DETECTED NOT DETECTED Final   Serratia marcescens NOT DETECTED NOT DETECTED Final   Haemophilus influenzae NOT DETECTED NOT DETECTED Final   Neisseria meningitidis NOT DETECTED NOT DETECTED Final   Pseudomonas aeruginosa NOT DETECTED NOT DETECTED Final   Stenotrophomonas maltophilia NOT DETECTED NOT DETECTED Final   Candida albicans NOT DETECTED NOT DETECTED Final   Candida auris NOT DETECTED NOT DETECTED Final   Candida glabrata NOT DETECTED NOT DETECTED Final   Candida krusei NOT DETECTED NOT DETECTED Final   Candida parapsilosis NOT DETECTED NOT DETECTED Final   Candida tropicalis NOT DETECTED NOT DETECTED Final   Cryptococcus neoformans/gattii NOT DETECTED NOT DETECTED Final    Comment: Performed at [facility], [address] [place], [place] [address]  Culture, blood (Routine X 2) w Reflex to  ID Panel     Status: Abnormal   Collection Time: 04/22/23  9:54 PM   Specimen: BLOOD LEFT FOREARM  Result Value Ref Range Status   Specimen  Description   Final    BLOOD LEFT FOREARM Performed at [facility], [address] [place], [place] [address]    Special Requests   Final    BOTTLES DRAWN AEROBIC ONLY Blood Culture results may not be optimal due to an inadequate volume of blood received in culture bottles Performed at [facility], [address], [place], [place] [address]    Culture  Setup Time   Final    GRAM POSITIVE COCCI IN CLUSTERS AEROBIC BOTTLE ONLY CRITICAL RESULT CALLED TO, READ BACK BY AND VERIFIED WITH: PHARMD NCarin Hock [id number] @ 2120 FH     Culture (A)  Final    STAPHYLOCOCCUS HAEMOLYTICUS THE SIGNIFICANCE OF ISOLATING THIS ORGANISM FROM A SINGLE SET OF BLOOD CULTURES WHEN MULTIPLE SETS ARE DRAWN IS UNCERTAIN. PLEASE NOTIFY THE MICROBIOLOGY DEPARTMENT WITHIN ONE WEEK IF SPECIATION AND SENSITIVITIES ARE REQUIRED. Performed at [facility], [address] [place], [place] [address]    Report Status 04/25/2023 FINAL  Final  Respiratory (~20 pathogens) panel by PCR     Status: None   Collection Time: 04/22/23 11:00 PM   Specimen: Nasopharyngeal Swab; Respiratory  Result Value Ref Range Status   Adenovirus NOT DETECTED NOT DETECTED Final   Coronavirus 229E NOT DETECTED NOT DETECTED Final    Comment: (NOTE) The Coronavirus on the Respiratory Panel, DOES NOT test for the novel  Coronavirus (2019 nCoV)    Coronavirus HKU1 NOT DETECTED NOT DETECTED Final   Coronavirus NL63 NOT DETECTED NOT DETECTED Final   Coronavirus OC43 NOT DETECTED NOT DETECTED Final   Metapneumovirus NOT DETECTED NOT DETECTED Final   Rhinovirus / Enterovirus NOT DETECTED NOT DETECTED Final   Influenza A NOT DETECTED NOT DETECTED Final   Influenza B NOT DETECTED NOT DETECTED Final   Parainfluenza Virus 1 NOT DETECTED NOT DETECTED Final   Parainfluenza Virus 2 NOT DETECTED NOT DETECTED Final   Parainfluenza Virus 3 NOT DETECTED NOT DETECTED Final   Parainfluenza Virus 4 NOT DETECTED NOT DETECTED Final    Respiratory Syncytial Virus NOT DETECTED NOT DETECTED Final   Bordetella pertussis NOT DETECTED NOT DETECTED Final   Bordetella Parapertussis NOT DETECTED NOT DETECTED Final   Chlamydophila pneumoniae NOT DETECTED NOT DETECTED Final   Mycoplasma pneumoniae NOT DETECTED NOT DETECTED Final    Comment: Performed at [facility], [address], [place], [place] [address]  Blood Culture ID Panel (Reflexed)     Status: Abnormal   Collection Time: 04/23/23  9:46 AM  Result Value Ref Range Status   Enterococcus faecalis NOT DETECTED NOT DETECTED Final   Enterococcus Faecium NOT DETECTED NOT DETECTED Final   Listeria monocytogenes NOT DETECTED NOT DETECTED Final   Staphylococcus species DETECTED (A) NOT DETECTED Final    Comment: CRITICAL RESULT CALLED TO, READ BACK BY AND VERIFIED WITH: Abe People [id number] @ 2120 FH     Staphylococcus aureus (BCID) NOT DETECTED NOT DETECTED Final   Staphylococcus epidermidis NOT DETECTED NOT DETECTED Final   Staphylococcus lugdunensis NOT DETECTED NOT DETECTED Final   Streptococcus species NOT DETECTED NOT DETECTED Final   Streptococcus agalactiae NOT DETECTED NOT DETECTED Final   Streptococcus pneumoniae NOT DETECTED NOT DETECTED Final   Streptococcus pyogenes NOT DETECTED NOT DETECTED Final   A.calcoaceticus-baumannii NOT DETECTED  NOT DETECTED Final   Bacteroides fragilis NOT DETECTED NOT DETECTED Final   Enterobacterales NOT DETECTED NOT DETECTED Final   Enterobacter cloacae complex NOT DETECTED NOT DETECTED Final   Escherichia coli NOT DETECTED NOT DETECTED Final   Klebsiella aerogenes NOT DETECTED NOT DETECTED Final   Klebsiella oxytoca NOT DETECTED NOT DETECTED Final   Klebsiella pneumoniae NOT DETECTED NOT DETECTED Final   Proteus species NOT DETECTED NOT DETECTED Final   Salmonella species NOT DETECTED NOT DETECTED Final   Serratia marcescens NOT DETECTED NOT DETECTED Final   Haemophilus influenzae NOT DETECTED NOT DETECTED Final    Neisseria meningitidis NOT DETECTED NOT DETECTED Final   Pseudomonas aeruginosa NOT DETECTED NOT DETECTED Final   Stenotrophomonas maltophilia NOT DETECTED NOT DETECTED Final   Candida albicans NOT DETECTED NOT DETECTED Final   Candida auris NOT DETECTED NOT DETECTED Final   Candida glabrata NOT DETECTED NOT DETECTED Final   Candida krusei NOT DETECTED NOT DETECTED Final   Candida parapsilosis NOT DETECTED NOT DETECTED Final   Candida tropicalis NOT DETECTED NOT DETECTED Final   Cryptococcus neoformans/gattii NOT DETECTED NOT DETECTED Final    Comment: Performed at [facility], [address] [place] [address] [place], [place] [address]  Culture, blood (Routine X 2) w Reflex to ID Panel     Status: None (Preliminary result)   Collection Time: 04/25/23 12:47 PM   Specimen: BLOOD RIGHT ARM  Result Value Ref Range Status   Specimen Description   Final    BLOOD RIGHT ARM Performed at [facility], [address] [place], [place] [address]    Special Requests   Final    AEROBIC BOTTLE ONLY Blood Culture adequate volume Performed at [facility], [address] [place], [place] [address]    Culture   Final    NO GROWTH 3 DAYS Performed at [facility], [address] [place], [place] [address]    Report Status PENDING  Incomplete  Culture, blood (Routine X 2) w Reflex to ID Panel     Status: None (Preliminary result)   Collection Time: 04/25/23 12:47 PM   Specimen: BLOOD RIGHT HAND  Result Value Ref Range Status   Specimen Description   Final    BLOOD RIGHT HAND Performed at [facility], [address], [place], [place] [address]    Special Requests   Final    AEROBIC BOTTLE ONLY Blood Culture adequate volume Performed at [facility], [address] [place], [place] [address]    Culture   Final    NO GROWTH 3 DAYS Performed at [facility], [address] [place], [place] [address]    Report  Status PENDING  Incomplete    Antimicrobials/Microbiology: Anti-infectives (From admission, onward)    Start     Dose/Rate Route Frequency Ordered Stop   04/25/23 1400  vancomycin (VANCOREADY) IVPB 1750 mg/350 mL        1,750 mg 175 mL/hr over 120 Minutes Intravenous Every 24 hours 04/25/23 1310     04/22/23 2330  piperacillin-tazobactam (ZOSYN) IVPB 3.375 g        3.375 g 12.5 mL/hr over 240 Minutes Intravenous Every 8 hours 04/22/23 2310           Component Value Date/Time   SDES  04/25/2023 1247    BLOOD RIGHT ARM Performed at [facility], [address], [place], [place] [address]    SDES  04/25/2023 1247  BLOOD RIGHT HAND Performed at [facility], [address] [place], [place] [address]    SPECREQUEST  04/25/2023 1247    AEROBIC BOTTLE ONLY Blood Culture adequate volume Performed at [facility] [address], [place], [place] [address]    SPECREQUEST  04/25/2023 1247    AEROBIC BOTTLE ONLY Blood Culture adequate volume Performed at [facility], [address] [place], [place] [address]    CULT  04/25/2023 1247    NO GROWTH 3 DAYS Performed at [facility], [address] [place], [place] [address]    CULT  04/25/2023 1247    NO GROWTH 3 DAYS Performed at [facility], [address] [place], [place] [address]    REPTSTATUS PENDING 04/25/2023 1247   REPTSTATUS PENDING 04/25/2023 1247     Radiology Studies: No results found.   LOS: 5 days   Total time spent in review of labs and imaging, patient evaluation, formulation of plan, documentation and communication with family: 35 minutes  Lanae Boast, MD  [facility]  04/28/2023, 12:16 PM

## 2023-04-28 NOTE — Progress Notes (Signed)
Mobility Specialist - Progress Note  Pre-mobility: 114/71 mmHg BP, (sitting) During mobility: 133 bpm HR, Post-mobility: 108 bpm HR, 155/77 mmHg BP,    04/28/23 1519  Mobility  Activity Ambulated with assistance in hallway  Level of Assistance Minimal assist, patient does 75% or more  Assistive Device Front wheel walker  Distance Ambulated (ft) 30 ft  Range of Motion/Exercises Active Assistive  Activity Response Tolerated fair  Mobility Referral Yes  Mobility visit 1 Mobility  Mobility Specialist Start Time (ACUTE ONLY) 1455  Mobility Specialist Stop Time (ACUTE ONLY) 1519  Mobility Specialist Time Calculation (min) (ACUTE ONLY) 24 min   Pt was found in bed and agreeable to ambulate. Pt stated feeling a little dizzy with standing and transferred to recliner chair BP checked to be 114/71 mmHg. Afterwards ambulated in hallway with slow gait and stated feeling unwell throughout session. Returned to sit on recliner chair and stated feeling unwell and returned to bed. After transitioning to bed stated feeling the need to have BM. Transferred to [facility] and call bell in reach. RN notified.  Billey Chang Mobility Specialist

## 2023-04-28 NOTE — Plan of Care (Signed)
  Problem: Coping: Goal: Ability to adjust to condition or change in health will improve Outcome: Progressing   Problem: Nutritional: Goal: Maintenance of adequate nutrition will improve Outcome: Progressing   Problem: Nutrition: Goal: Adequate nutrition will be maintained Outcome: Progressing   Problem: Coping: Goal: Level of anxiety will decrease Outcome: Progressing   Problem: Pain Managment: Goal: General experience of comfort will improve and/or be controlled Outcome: Progressing   Problem: Safety: Goal: Ability to remain free from injury will improve Outcome: Progressing   Problem: Skin Integrity: Goal: Risk for impaired skin integrity will decrease Outcome: Progressing

## 2023-04-28 NOTE — TOC CM/SW Note (Signed)
 [facility] Quality rating ???? Patient survey rating ????   [facility] Quality rating ????? Patient survey rating???   [facility] [phone] Quality rating???? Patient survey rating????   [facility] [phone] Quality rating???? Patient survey rating????   [facility] [phone] Quality rating ???? Patient survey rating???   [facility] Quality rating??? Patient survey rating???   [facility] [phone] Quality rating??? Patient survey rating ????   [facility] [phone] Quality rating ????   [facility] Quality rating ??? Patient survey rating ????   [facility] Quality rating ??? Patient survey rating???   [facility] Quality rating ????? Patient survey rating ???   [facility] [phone] Quality rating ????? Patient survey rating ????

## 2023-04-28 NOTE — Plan of Care (Signed)

## 2023-04-29 ENCOUNTER — Inpatient Hospital Stay (HOSPITAL_COMMUNITY): Payer: Medicare HMO

## 2023-04-29 DIAGNOSIS — R55 Syncope and collapse: Secondary | ICD-10-CM | POA: Diagnosis not present

## 2023-04-29 LAB — CBC
HCT: 24.3 % — ABNORMAL LOW (ref 39.0–52.0)
Hemoglobin: 7.5 g/dL — ABNORMAL LOW (ref 13.0–17.0)
MCH: 27.2 pg (ref 26.0–34.0)
MCHC: 30.9 g/dL (ref 30.0–36.0)
MCV: 88 fL (ref 80.0–100.0)
Platelets: 557 10*3/uL — ABNORMAL HIGH (ref 150–400)
RBC: 2.76 MIL/uL — ABNORMAL LOW (ref 4.22–5.81)
RDW: 17.3 % — ABNORMAL HIGH (ref 11.5–15.5)
WBC: 19.2 10*3/uL — ABNORMAL HIGH (ref 4.0–10.5)
nRBC: 0 % (ref 0.0–0.2)

## 2023-04-29 LAB — PROCALCITONIN: Procalcitonin: 0.29 ng/mL

## 2023-04-29 LAB — GLUCOSE, CAPILLARY
Glucose-Capillary: 114 mg/dL — ABNORMAL HIGH (ref 70–99)
Glucose-Capillary: 146 mg/dL — ABNORMAL HIGH (ref 70–99)
Glucose-Capillary: 125 mg/dL — ABNORMAL HIGH (ref 70–99)
Glucose-Capillary: 133 mg/dL — ABNORMAL HIGH (ref 70–99)

## 2023-04-29 LAB — C-REACTIVE PROTEIN: CRP: 8.8 mg/dL — ABNORMAL HIGH (ref <1.0)

## 2023-04-29 LAB — URINALYSIS, ROUTINE W REFLEX MICROSCOPIC
Bilirubin Urine: NEGATIVE
Glucose, UA: NEGATIVE mg/dL
Hgb urine dipstick: NEGATIVE
Ketones, ur: NEGATIVE mg/dL
Leukocytes,Ua: NEGATIVE
Nitrite: NEGATIVE
Protein, ur: 30 mg/dL — AB
Specific Gravity, Urine: 1.019 (ref 1.005–1.030)
pH: 5 (ref 5.0–8.0)

## 2023-04-29 LAB — BASIC METABOLIC PANEL
Anion gap: 9 (ref 5–15)
BUN: 12 mg/dL (ref 8–23)
CO2: 21 mmol/L — ABNORMAL LOW (ref 22–32)
Calcium: 8.2 mg/dL — ABNORMAL LOW (ref 8.9–10.3)
Chloride: 106 mmol/L (ref 98–111)
Creatinine, Ser: 0.63 mg/dL (ref 0.61–1.24)
GFR, Estimated: >60 mL/min (ref >60)
Glucose, Bld: 124 mg/dL — ABNORMAL HIGH (ref 70–99)
Potassium: 3.4 mmol/L — ABNORMAL LOW (ref 3.5–5.1)
Sodium: 136 mmol/L (ref 135–145)

## 2023-04-29 LAB — PROTIME-INR
INR: 2.4 — ABNORMAL HIGH (ref 0.8–1.2)
Prothrombin Time: 26.1 s — ABNORMAL HIGH (ref 11.4–15.2)

## 2023-04-29 MED ORDER — ENSURE ENLIVE PO LIQD
237.0000 mL | ORAL | Status: DC
  Administered 2023-04-30 – 2023-05-01 (×2): 237 mL via ORAL

## 2023-04-29 MED ORDER — ADULT MULTIVITAMIN W/MINERALS CH
1.0000 | ORAL_TABLET | Freq: Every day | ORAL | Status: DC
  Administered 2023-04-30 – 2023-05-01 (×2): 1 via ORAL
  Filled 2023-04-29 (×2): qty 1

## 2023-04-29 MED ORDER — WARFARIN SODIUM 5 MG PO TABS
7.5000 mg | ORAL_TABLET | Freq: Once | ORAL | Status: AC
  Administered 2023-04-29: 7.5 mg via ORAL
  Filled 2023-04-29: qty 1

## 2023-04-29 MED ORDER — LACTATED RINGERS IV SOLN: INTRAVENOUS | Status: DC

## 2023-04-29 NOTE — Progress Notes (Signed)
 Physical Therapy Treatment Patient Details Name: Christian Crawford MRN: [id number] DOB: [birth year] Today's Date: 04/29/2023   History of Present Illness 89 yr old man who presented to [facility] ED on 04/22/2023 with complaints of syncope. He stopped at a convenience store on his way home from [facility], got dizzy and passed out in the store. He did not strike his head. Exam in the ED was nonfocal.   Of note,  pt was just d/c from ED same day d/t being seen for supratherapeutic INR.SABRA It was reversed with vitamin K . The INR was reduced to 1.5, the patient was discharged on Lovenox     PT Comments  Pt agreeable to working with therapy. Family present during session-encouraging pt. During session, HR up to 145 bpm. Deferred ambulation and assisted pt to recliner instead. Once in recliner, BP 91/63, HR 137 bpm, O2 100% on RA. Assisted pt back to bed-made RN aware.     If plan is discharge home, recommend the following: Help with stairs or ramp for entrance;Assistance with cooking/housework;A little help with walking and/or transfers;A little help with bathing/dressing/bathroom   Can travel by private vehicle        Equipment Recommendations  None recommended by PT    Recommendations for Other Services       Precautions / Restrictions Precautions Precautions: Fall Restrictions Weight Bearing Restrictions Per Provider Order: No     Mobility  Bed Mobility Overal bed mobility: Needs Assistance Bed Mobility: Supine to Sit, Sit to Supine     Supine to sit: Supervision, HOB elevated, Used rails Sit to supine: Supervision, HOB elevated, Used rails        Transfers Overall transfer level: Needs assistance Equipment used: Rolling walker (2 wheels) Transfers: Sit to/from Stand, Bed to chair/wheelchair/BSC Sit to Stand: Contact guard assist, From elevated surface   Step pivot transfers: Contact guard assist       General transfer comment: Assist to rise, stabilize, control descent. Cues for safety,  technique, hand placement. Increased time. After transfer to recliner, pt reported not feeling well-BP 91/63, HR 137 bpm. Assisted pt back to bed and made RN aware.    Ambulation/Gait                   Stairs             Wheelchair Mobility     Tilt Bed    Modified Rankin (Stroke Patients Only)       Balance           Standing balance support: No upper extremity supported, During functional activity, Single extremity supported Standing balance-Leahy Scale: Poor                              Cognition Arousal: Alert Behavior During Therapy: Flat affect Overall Cognitive Status: Within Functional Limits for tasks assessed                                          Exercises      General Comments        Pertinent Vitals/Pain Pain Assessment Pain Assessment: No/denies pain    Home Living                          Prior Function  PT Goals (current goals can now be found in the care plan section) Progress towards PT goals: Progressing toward goals    Frequency    Min 1X/week      PT Plan      Co-evaluation              AM-PAC PT 6 Clicks Mobility   Outcome Measure  Help needed turning from your back to your side while in a flat bed without using bedrails?: A Little Help needed moving from lying on your back to sitting on the side of a flat bed without using bedrails?: A Little Help needed moving to and from a bed to a chair (including a wheelchair)?: A Little Help needed standing up from a chair using your arms (e.g., wheelchair or bedside chair)?: A Little Help needed to walk in hospital room?: A Little Help needed climbing 3-5 steps with a railing? : A Lot 6 Click Score: 17    End of Session Equipment Utilized During Treatment: Gait belt Activity Tolerance: Patient tolerated treatment well;Patient limited by fatigue Patient left: in bed;with call bell/phone within  reach;with bed alarm set;with family/visitor present   PT Visit Diagnosis: Other abnormalities of gait and mobility (R26.89);Muscle weakness (generalized) (M62.81);Difficulty in walking, not elsewhere classified (R26.2)     Time: 8968-8947 PT Time Calculation (min) (ACUTE ONLY): 21 min  Charges:    $Gait Training: 8-22 mins PT General Charges $$ ACUTE PT VISIT: 1 Visit                         Dannial SQUIBB, PT Acute Rehabilitation  Office: [phone]

## 2023-04-29 NOTE — Plan of Care (Signed)

## 2023-04-29 NOTE — Progress Notes (Signed)
 PROGRESS NOTE Christian Crawford  FMW:[id number] DOB: 12-10-1933 DOA: 04/22/2023 PCP: Maree Leni Edyth DELENA, MD  Brief Narrative/Hospital Course: 89 yr old man who presented to [facility] ED on 04/22/2023 with complaints of syncope after he had been discharged to home earlier in the day after a brief admission for a supertherapeutic INR. He was on coumadin  for atrial fibrillation, and presented to a spontaneous left chest wall hematoma. It was reversed with vitamin K . The INR was reduced to 1.5, and the patient was discharged on Lovenox . He stopped at a convenience store on his way home, got dizzy and passed out in the store. He did not strike his head. Exam in the ED was nonfocal. The patient did answer questions appropriately. Earlier this month the patient had undergone an MRCP and had visited GI which demonstrated an abnormal gallbladder up to 4.3 cm with circumferential wall thickening, also dilatation of the pancreatic duct and abnormal MRCP. Abdominal ultrasound on 04/22/2023 demonstrated no evidence of acute cholecystitis. CTA of the chest and abdomen demonstrated acute pancreatitis with possibly developing pseudocyst. GI was consulted and determined that no ERCP was necessary. Recommendation is that the patient follow up with [facility] as previously planned as outpatient. EEG and echocardiogram obtained including CT head CT chest abdomen pelvis as above with no acute finding EF 65-70% G1 DD, EEG without seizures. Patient had bacteremia in the blood culture and managed antibiotics pending further culture sensitivity. Patient was also treated for acute pancreatitis on chronic pancreatitis patient CT scan.    Subjective: Patient seen and examined One of the daughters at the bedside Overall patient reports he feels better denies nausea vomiting abdominal pain fever chills or dysuria Some left shoulder pain posteriorly- pain geting better left subscapular hematoma in ct few days ago Labs shows worsening  leukocytosis 12>17> 19k Reports he had bowel movement and was brown  Assessment and Plan: Principal Problem:   Syncope and collapse Active Problems:   Acute pancreatitis   Abnormal magnetic resonance cholangiopancreatography (MRCP)   Leucocytosis   Benign essential hypertension   Paroxysmal atrial fibrillation (HCC)   Anemia   Abnormal CT of the abdomen   Idiopathic chronic pancreatitis (HCC)   Syncope: exact etiology is unknown but could be related to multifactorial reasons.  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  Positive blood culture as below: Consistent with contamination. BCID with Staphylococcus, from 1/29- grew staph hemolyticus likely contamination and Streptococcus from 1/28> Peridin-C Streptococcus final. Repeat blood culture 1/31 no growth to date.  Discussed with Dr. Fleeta Rothman from ID-it is felt to be contamination and antibiotics discontinued 2/3    Acute pancreatitis on chronic pancreatitis by CT scan: No abdominal pain currently and tolerating diet, lipase back to normal.  MRCP showed : Dilatation of the pancreatic duct measuring up to 0.7 cm, with abrupt caliber change in the pancreatic head neck junction. Diffusely atrophic pancreatic parenchyma distal to this point.Generally normal appearance of the pancreatic head. This appearancehas evolved over prior examinations dating back to 07/24/2019 andmost likely reflects chronic sequelae of pancreatitis, without discretely visible mass. 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 This requires no specific further follow-up given small size and advanced patient age. No acute inflammatory findings in the abdomen at this time.  Discussed with GI and General surgery. It is felt that these changes are chronic. No need for intervention. They patient is urged to follow up with his GI in [facility] and he has an appointment with GI [facility] on June 20, 2023. That is the earliest appointment family could get. GI recommends to follow-up with [facility]. Lipid panel LDL 39 triglycerides 76 cholesterol 86 Patient tolerating diet denies any abdominal pain and abdomen exam is benign.  Leukocytosis: Without fever unclear etiology question reactive in the setting of pancreatitis versus other etiology.Small left subscapularis hematoma. Check UA, CXR, Procal CRP.Start gentle IV fluid hydration.  If remains persistently elevated will obtain CT abdpelvis again-of note he recently had CT scan on 1/22,1/27,1/28 Recent Labs  Lab 04/22/23 1707 04/23/23 0611 04/25/23 0920 04/26/23 0700 04/27/23 0539 04/28/23 0521 04/29/23 0545  WBC  --    < > 14.1* 11.0* 12.4* 17.3* 19.2*  LATICACIDVEN 1.8  --   --   --   --   --   --    < > = values in this interval not displayed.    Essential hypertension: BP is controlled continue on Norvasc  hydralazine losartan  and metoprolol .   PAF: Cont on Coumadin  rate controlled with metoprolol -INR therapeutic pharmacy dosing as as below. Recent Labs  Lab 04/25/23 0523 04/26/23 0700 04/27/23 0539 04/28/23 0521 04/29/23 0545  INR 2.7* 2.7* 3.0* 2.8* 2.4*    Chronic anemia: hb trended down hemoglobin 7.9 from 11.9 on April 11, 2023 partly related to hemodilution, INR was 7.8 on 04/21/2023, INR stable no evidence of acute bleeding-FOBT unable to be done- but stool is brown color, hemoglobin trending up  anemia panel  showed iron deficiency Recent Labs    04/25/23 0920 04/26/23 0700 04/27/23 0539 04/28/23 0521 04/29/23 0545  HGB 7.9* 7.6* 7.9* 8.4* 7.5*  MCV 88.8 88.3 88.6 88.2 88.0  VITAMINB12  --   --   --  697  --   FOLATE  --   --   --  12.3  --   FERRITIN  --   --   --  378*  --   TIBC  --   --   --  151*  --   IRON  --   --   --  21*  --   RETICCTPCT  --   --   --  5.4*  --     Glaucoma: cont latanoprost  eyedrops  DVT prophylaxis: Coumadin  Code Status:   Code Status: Full Code Family Communication: plan of care discussed with patient/daughter at bedside. Patient status is: Remains hospitalized because of severity of illness Level of care: Telemetry   Dispo: The patient is from: home            Anticipated disposition: TBD 24 hrs. Objective: Vitals last 24 hrs: Vitals:   04/28/23 2110 04/29/23 0216 04/29/23 0522 04/29/23 0900  BP: 120/65 126/63 120/63   Pulse: (!) 126 99 (!) 107   Resp: 18 18 18  (!) 24  Temp: 98.4 F (36.9 C) 98.5 F (36.9 C) 98.5 F (36.9 C)   TempSrc:      SpO2: 99% 98% 99%   Weight:      Height:  Weight change:   Physical Examination: General exam: alert awake, oriented pleasant.   HEENT:Oral mucosa moist, Ear/Nose WNL grossly Respiratory system: Bilaterally clear BS,no use of accessory muscle Cardiovascular system: S1 & S2 +, No JVD. Gastrointestinal system: Abdomen soft,NT,ND, BS+ Nervous System: Alert, awake, moving all extremities,and following commands. Extremities: LE edema neg,distal peripheral pulses palpable and warm.  Skin: No rashes,no icterus. MSK: Normal muscle bulk,tone, power   Medications reviewed:  Scheduled Meds:  amLODipine   10 mg Oral Daily   insulin  aspart  0-6 Units Subcutaneous TID WC   latanoprost   1 drop Both Eyes QHS   losartan   25 mg Oral Daily   metoprolol  tartrate  75 mg Oral BID   thiamine   100 mg Oral Daily   warfarin  7.5 mg Oral ONCE-1600   Warfarin - Pharmacist Dosing Inpatient   Does not apply q1600    Continuous Infusions:  lactated ringers       Diet Order             Diet regular Room service appropriate? Yes; Fluid consistency: Thin  Diet effective now                  Intake/Output Summary (Last 24 hours) at 04/29/2023 1022 Last data filed at 04/28/2023 1456 Gross per 24 hour  Intake 24.04 ml  Output 300 ml  Net -275.96 ml   Net IO Since Admission: -209.53 mL [04/29/23 1022]  Wt Readings from Last 3 Encounters:  04/22/23 79 kg  04/21/23 79.4 kg  04/11/23 79.4 kg     Unresulted Labs (From admission, onward)     Start     Ordered   04/29/23 0500  Basic metabolic panel  Daily,   R     Question:  Specimen collection method  Answer:  Lab=Lab collect   04/28/23 1123   04/29/23 0500  CBC  Daily,   R     Question:  Specimen collection method  Answer:  Lab=Lab collect   04/28/23 1123   04/27/23 1139  Occult blood card to lab, stool  Once,   R        04/27/23 1138   04/23/23 0500  Protime-INR  Daily,   R      04/22/23 2320           Data Reviewed: I have personally reviewed following labs and imaging studies CBC: Recent Labs  Lab 04/23/23 0611 04/24/23 0552 04/25/23 0920 04/26/23 0700 04/27/23 0539 04/28/23 0521 04/29/23 0545  WBC 17.8* 14.0* 14.1* 11.0* 12.4* 17.3* 19.2*  NEUTROABS 13.0* 8.6* 9.2* 6.5 8.1*  --   --   HGB 8.3* 7.7* 7.9* 7.6* 7.9* 8.4* 7.5*  HCT 25.8* 24.3* 25.3* 24.1* 24.9* 26.8* 24.3*  MCV 85.7 86.8 88.8 88.3 88.6 88.2 88.0  PLT 607* 570* 602* 556* 559* 604* 557*   Basic Metabolic Panel:  Recent Labs  Lab 04/23/23 0611 04/24/23 0552 04/25/23 0920 04/26/23 0700 04/27/23 0539 04/28/23 0521 04/29/23 0545  NA 137   < > 141 138 136 137 136  K 3.5   < > 4.1 3.9 3.8 3.9 3.4*  CL 103   < > 111 108 108 109 106  CO2 22   < > 22 22 21* 21* 21*  GLUCOSE 146*   < > 111* 112* 102* 106* 124*  BUN 10   < > 11 10 8 8 12   CREATININE 0.33*   < > 0.63 0.65 0.63 0.67 0.63  CALCIUM  8.6*   < >  8.4* 8.1* 8.2* 8.4* 8.2*  MG 1.6*  --   --   --    --   --   --   PHOS 2.6  --   --   --   --   --   --    < > = values in this interval not displayed.   GFR: Estimated Creatinine Clearance: 71.3 mL/min (by C-G formula based on SCr of 0.63 mg/dL). Liver Function Tests:  Recent Labs  Lab 04/22/23 2043 04/23/23 0611 04/28/23 0521  AST 28 25 18   ALT 16 16 10   ALKPHOS 86 81 67  BILITOT 1.1 0.9 1.2  PROT 6.3* 6.3* 6.0*  ALBUMIN 2.7* 2.9* 2.8*   Recent Labs  Lab 04/22/23 1649  LIPASE 49   No results for input(s): AMMONIA in the last 168 hours. Coagulation Profile:  Recent Labs  Lab 04/25/23 0523 04/26/23 0700 04/27/23 0539 04/28/23 0521 04/29/23 0545  INR 2.7* 2.7* 3.0* 2.8* 2.4*   No results for input(s): PROBNP in the last 168 hours.  No results for input(s): HGBA1C in the last 72 hours. Recent Labs  Lab 04/28/23 0814 04/28/23 1159 04/28/23 1656 04/28/23 2010 04/29/23 0754  GLUCAP 137* 110* 151* 153* 114*   No results for input(s): CHOL, HDL, LDLCALC, TRIG, CHOLHDL, LDLDIRECT in the last 72 hours. No results for input(s): TSH, T4TOTAL, FREET4, T3FREE, THYROIDAB in the last 72 hours. Sepsis Labs: Recent Labs  Lab 04/22/23 1707  LATICACIDVEN 1.8   Recent Results (from the past 240 hours)  Culture, blood (Routine X 2) w Reflex to ID Panel     Status: Abnormal   Collection Time: 04/22/23  9:53 PM   Specimen: BLOOD RIGHT FOREARM  Result Value Ref Range Status   Specimen Description   Final    BLOOD RIGHT FOREARM Performed at [facility], [address] [place], [place] [address]    Special Requests   Final    BOTTLES DRAWN AEROBIC AND ANAEROBIC Blood Culture results may not be optimal due to an inadequate volume of blood received in culture bottles Performed at [facility], [address], [place], [place] [address]    Culture  Setup Time   Final    GRAM POSITIVE COCCI IN CHAINS AEROBIC BOTTLE ONLY CRITICAL RESULT CALLED TO, READ BACK BY AND VERIFIED  WITH: PHARMD N. GLOGOVAC [id number] @ 2120 FH  Performed at [facility], [address] [place], [place] [address]    Culture VIRIDANS STREPTOCOCCUS (A)  Final   Report Status 04/26/2023 FINAL  Final   Organism ID, Bacteria VIRIDANS STREPTOCOCCUS  Final      Susceptibility   Viridans streptococcus - MIC*    PENICILLIN <=0.06 SENSITIVE Sensitive     CEFTRIAXONE  <=0.12 SENSITIVE Sensitive     LEVOFLOXACIN 4 INTERMEDIATE Intermediate     VANCOMYCIN  <=0.12 SENSITIVE Sensitive     * VIRIDANS STREPTOCOCCUS  Blood Culture ID Panel (Reflexed)     Status: Abnormal   Collection Time: 04/22/23  9:53 PM  Result Value Ref Range Status   Enterococcus faecalis NOT DETECTED NOT DETECTED Final   Enterococcus Faecium NOT DETECTED NOT DETECTED Final   Listeria monocytogenes NOT DETECTED NOT DETECTED Final   Staphylococcus species NOT DETECTED NOT DETECTED Final   Staphylococcus aureus (BCID) NOT DETECTED NOT DETECTED Final   Staphylococcus epidermidis NOT DETECTED NOT DETECTED Final   Staphylococcus lugdunensis NOT DETECTED NOT DETECTED Final   Streptococcus species DETECTED (A) NOT DETECTED Final  Comment: Not Enterococcus species, Streptococcus agalactiae, Streptococcus pyogenes, or Streptococcus pneumoniae. CRITICAL RESULT CALLED TO, READ BACK BY AND VERIFIED WITH: MAYA GEANNIE ROLLER [id number] @ 2120 FH     Streptococcus agalactiae NOT DETECTED NOT DETECTED Final   Streptococcus pneumoniae NOT DETECTED NOT DETECTED Final   Streptococcus pyogenes NOT DETECTED NOT DETECTED Final   A.calcoaceticus-baumannii NOT DETECTED NOT DETECTED Final   Bacteroides fragilis NOT DETECTED NOT DETECTED Final   Enterobacterales NOT DETECTED NOT DETECTED Final   Enterobacter cloacae complex NOT DETECTED NOT DETECTED Final   Escherichia coli NOT DETECTED NOT DETECTED Final   Klebsiella aerogenes NOT DETECTED NOT DETECTED Final   Klebsiella oxytoca NOT DETECTED NOT DETECTED Final   Klebsiella pneumoniae NOT  DETECTED NOT DETECTED Final   Proteus species NOT DETECTED NOT DETECTED Final   Salmonella species NOT DETECTED NOT DETECTED Final   Serratia marcescens NOT DETECTED NOT DETECTED Final   Haemophilus influenzae NOT DETECTED NOT DETECTED Final   Neisseria meningitidis NOT DETECTED NOT DETECTED Final   Pseudomonas aeruginosa NOT DETECTED NOT DETECTED Final   Stenotrophomonas maltophilia NOT DETECTED NOT DETECTED Final   Candida albicans NOT DETECTED NOT DETECTED Final   Candida auris NOT DETECTED NOT DETECTED Final   Candida glabrata NOT DETECTED NOT DETECTED Final   Candida krusei NOT DETECTED NOT DETECTED Final   Candida parapsilosis NOT DETECTED NOT DETECTED Final   Candida tropicalis NOT DETECTED NOT DETECTED Final   Cryptococcus neoformans/gattii NOT DETECTED NOT DETECTED Final    Comment: Performed at [facility], [address], [place], [place] [address]  Culture, blood (Routine X 2) w Reflex to ID Panel     Status: Abnormal   Collection Time: 04/22/23  9:54 PM   Specimen: BLOOD LEFT FOREARM  Result Value Ref Range Status   Specimen Description   Final    BLOOD LEFT FOREARM Performed at [facility], [address] [place], [place] [address]    Special Requests   Final    BOTTLES DRAWN AEROBIC ONLY Blood Culture results may not be optimal due to an inadequate volume of blood received in culture bottles Performed at [facility], [address] [place], [place] [address]    Culture  Setup Time   Final    GRAM POSITIVE COCCI IN CLUSTERS AEROBIC BOTTLE ONLY CRITICAL RESULT CALLED TO, READ BACK BY AND VERIFIED WITH: PHARMD N. GLOGOVAC [id number] @ 2120 FH     Culture (A)  Final    STAPHYLOCOCCUS HAEMOLYTICUS THE SIGNIFICANCE OF ISOLATING THIS ORGANISM FROM A SINGLE SET OF BLOOD CULTURES WHEN MULTIPLE SETS ARE DRAWN IS UNCERTAIN. PLEASE NOTIFY THE MICROBIOLOGY DEPARTMENT WITHIN ONE WEEK IF SPECIATION AND SENSITIVITIES ARE REQUIRED. Performed at [facility], [address], [place], [place] [address]    Report Status 04/25/2023 FINAL  Final  Respiratory (~20 pathogens) panel by PCR     Status: None   Collection Time: 04/22/23 11:00 PM   Specimen: Nasopharyngeal Swab; Respiratory  Result Value Ref Range Status   Adenovirus NOT DETECTED NOT DETECTED Final   Coronavirus 229E NOT DETECTED NOT DETECTED Final    Comment: (NOTE) The Coronavirus on the Respiratory Panel, DOES NOT test for the novel  Coronavirus (2019 nCoV)    Coronavirus HKU1 NOT DETECTED NOT DETECTED Final   Coronavirus NL63 NOT DETECTED NOT DETECTED Final   Coronavirus OC43 NOT DETECTED NOT DETECTED Final   Metapneumovirus NOT DETECTED NOT DETECTED Final   Rhinovirus / Enterovirus  NOT DETECTED NOT DETECTED Final   Influenza A NOT DETECTED NOT DETECTED Final   Influenza B NOT DETECTED NOT DETECTED Final   Parainfluenza Virus 1 NOT DETECTED NOT DETECTED Final   Parainfluenza Virus 2 NOT DETECTED NOT DETECTED Final   Parainfluenza Virus 3 NOT DETECTED NOT DETECTED Final   Parainfluenza Virus 4 NOT DETECTED NOT DETECTED Final   Respiratory Syncytial Virus NOT DETECTED NOT DETECTED Final   Bordetella pertussis NOT DETECTED NOT DETECTED Final   Bordetella Parapertussis NOT DETECTED NOT DETECTED Final   Chlamydophila pneumoniae NOT DETECTED NOT DETECTED Final   Mycoplasma pneumoniae NOT DETECTED NOT DETECTED Final    Comment: Performed at [facility], [address] [place], [place] [address]  Blood Culture ID Panel (Reflexed)     Status: Abnormal   Collection Time: 04/23/23  9:46 AM  Result Value Ref Range Status   Enterococcus faecalis NOT DETECTED NOT DETECTED Final   Enterococcus Faecium NOT DETECTED NOT DETECTED Final   Listeria monocytogenes NOT DETECTED NOT DETECTED Final   Staphylococcus species DETECTED (A) NOT DETECTED Final    Comment: CRITICAL RESULT CALLED TO, READ BACK BY AND VERIFIED WITH: MAYA GEANNIE ROLLER [id number] @ 2120 FH      Staphylococcus aureus (BCID) NOT DETECTED NOT DETECTED Final   Staphylococcus epidermidis NOT DETECTED NOT DETECTED Final   Staphylococcus lugdunensis NOT DETECTED NOT DETECTED Final   Streptococcus species NOT DETECTED NOT DETECTED Final   Streptococcus agalactiae NOT DETECTED NOT DETECTED Final   Streptococcus pneumoniae NOT DETECTED NOT DETECTED Final   Streptococcus pyogenes NOT DETECTED NOT DETECTED Final   A.calcoaceticus-baumannii NOT DETECTED NOT DETECTED Final   Bacteroides fragilis NOT DETECTED NOT DETECTED Final   Enterobacterales NOT DETECTED NOT DETECTED Final   Enterobacter cloacae complex NOT DETECTED NOT DETECTED Final   Escherichia coli NOT DETECTED NOT DETECTED Final   Klebsiella aerogenes NOT DETECTED NOT DETECTED Final   Klebsiella oxytoca NOT DETECTED NOT DETECTED Final   Klebsiella pneumoniae NOT DETECTED NOT DETECTED Final   Proteus species NOT DETECTED NOT DETECTED Final   Salmonella species NOT DETECTED NOT DETECTED Final   Serratia marcescens NOT DETECTED NOT DETECTED Final   Haemophilus influenzae NOT DETECTED NOT DETECTED Final   Neisseria meningitidis NOT DETECTED NOT DETECTED Final   Pseudomonas aeruginosa NOT DETECTED NOT DETECTED Final   Stenotrophomonas maltophilia NOT DETECTED NOT DETECTED Final   Candida albicans NOT DETECTED NOT DETECTED Final   Candida auris NOT DETECTED NOT DETECTED Final   Candida glabrata NOT DETECTED NOT DETECTED Final   Candida krusei NOT DETECTED NOT DETECTED Final   Candida parapsilosis NOT DETECTED NOT DETECTED Final   Candida tropicalis NOT DETECTED NOT DETECTED Final   Cryptococcus neoformans/gattii NOT DETECTED NOT DETECTED Final    Comment: Performed at [facility], [address] [place], [place] [address]  Culture, blood (Routine X 2) w Reflex to ID Panel     Status: None (Preliminary result)   Collection Time: 04/25/23 12:47 PM   Specimen: BLOOD RIGHT ARM  Result Value Ref Range Status   Specimen  Description   Final    BLOOD RIGHT ARM Performed at [facility], [address], [place], [place] [address]    Special Requests   Final    AEROBIC BOTTLE ONLY Blood Culture adequate volume Performed at [facility], [address], [place], [place] [address]    Culture   Final    NO GROWTH 4 DAYS  Performed at [facility], [address] [place], [place] [address]    Report Status PENDING  Incomplete  Culture, blood (Routine X 2) w Reflex to ID Panel     Status: None (Preliminary result)   Collection Time: 04/25/23 12:47 PM   Specimen: BLOOD RIGHT HAND  Result Value Ref Range Status   Specimen Description   Final    BLOOD RIGHT HAND Performed at [facility], [address] [place], [place] [address]    Special Requests   Final    AEROBIC BOTTLE ONLY Blood Culture adequate volume Performed at [facility], [address] [place], [place] [address]    Culture   Final    NO GROWTH 4 DAYS Performed at [facility], [address] [place], [place] [address]    Report Status PENDING  Incomplete    Antimicrobials/Microbiology: Anti-infectives (From admission, onward)    Start     Dose/Rate Route Frequency Ordered Stop   04/25/23 1400  vancomycin  (VANCOREADY) IVPB 1750 mg/350 mL  Status:  Discontinued        1,750 mg 175 mL/hr over 120 Minutes Intravenous Every 24 hours 04/25/23 1310 04/28/23 1305   04/22/23 2330  piperacillin -tazobactam (ZOSYN ) IVPB 3.375 g  Status:  Discontinued        3.375 g 12.5 mL/hr over 240 Minutes Intravenous Every 8 hours 04/22/23 2310 04/28/23 1305         Component Value Date/Time   SDES  04/25/2023 1247    BLOOD RIGHT ARM Performed at [facility], [address], [place], [place] [address]    SDES  04/25/2023 1247    BLOOD RIGHT HAND Performed at [facility], [address], [place], [place] [address]     SPECREQUEST  04/25/2023 1247    AEROBIC BOTTLE ONLY Blood Culture adequate volume Performed at [facility], [address] [place], [place] [address]    SPECREQUEST  04/25/2023 1247    AEROBIC BOTTLE ONLY Blood Culture adequate volume Performed at [facility], [address], [place], [place] [address]    CULT  04/25/2023 1247    NO GROWTH 4 DAYS Performed at [facility], [address] [place], [place] [address]    CULT  04/25/2023 1247    NO GROWTH 4 DAYS Performed at [facility], [address], [place], [place] [address]    REPTSTATUS PENDING 04/25/2023 1247   REPTSTATUS PENDING 04/25/2023 1247     Radiology Studies: No results found.   LOS: 6 days   Total time spent in review of labs and imaging, patient evaluation, formulation of plan, documentation and communication with family: 35 minutes  Mennie LAMY, MD  [facility]  04/29/2023, 10:22 AM

## 2023-04-29 NOTE — Progress Notes (Signed)
 PHARMACY - ANTICOAGULATION CONSULT NOTE  Pharmacy Consult for Coumadin  Indication: atrial fibrillation  Allergies  Allergen Reactions   Lisinopril Cough    Patient Measurements: Height: 6' 1 (185.4 cm) Weight: 79 kg (174 lb 2.6 oz) IBW/kg (Calculated) : 79.9  Vital Signs: Temp: 98.5 F (36.9 C) (02/04 0522) BP: 120/63 (02/04 0522) Pulse Rate: 107 (02/04 0522)  Labs: Recent Labs    04/27/23 0539 04/28/23 0521 04/29/23 0545  HGB 7.9* 8.4* 7.5*  HCT 24.9* 26.8* 24.3*  PLT 559* 604* 557*  LABPROT 31.0* 30.0* 26.1*  INR 3.0* 2.8* 2.4*  CREATININE 0.63 0.67 0.63    Estimated Creatinine Clearance: 71.3 mL/min (by C-G formula based on SCr of 0.63 mg/dL).   Assessment: 58 yoM with PMH HTN, DM2, on warfarin for Afib & prior TIA presents 1/28 after a syncopal episode. Patient was actually admitted the day before with chest wall hematoma and elevated INR, but had been discharged the following morning after successful INR reversal with Vit K.   PTA Warfarin regimen: 10mg  daily except 5mg  MWF; last dose 1/27    Significant events: - 1/27: INR is 7.8, Vit K 5 mg IV x 1  - 1/29: INR 1.9, Coumadin  5mg  ordered - NOT CHARTED (SZP)  Today, 04/29/2023: CBC: Hgb slightly lower this AM, Plt slightly elevated but stable No new s/s bleeding per LPN INR remains therapeutic; trending slightly down Major drug interactions: none noted;  No bleeding issues per nursing Eating 100% of (charted) meals, improved from previous   Goal of Therapy:  INR 2-3 Monitor platelets by anticoagulation protocol: Yes   Plan:  Warfarin 7.5 mg PO x 1 tonight Daily INR; CBC q72 hr while on warfarin Monitor for s/s bleeding or thrombosis   Thank you for allowing pharmacy to be a part of this patient's care.  Bard Jeans, PharmD, BCPS [phone] 04/29/2023, 7:28 AM

## 2023-04-29 NOTE — Progress Notes (Signed)
 Initial Nutrition Assessment  DOCUMENTATION CODES:   Severe malnutrition in context of chronic illness  INTERVENTION:  - Regular diet.  - Ensure Plus High Protein po once daily, provides 350 kcal and 20 grams of protein.  - Please note, nutrition supplements contain vitamin K  and patient noted to be on Coumadin .  - Encourage intake at all meals and of supplements.  - Multivitamin with minerals daily  - Monitor weight trends.   NUTRITION DIAGNOSIS:   Severe Malnutrition related to chronic illness as evidenced by severe fat depletion, severe muscle depletion.  GOAL:   Patient will meet greater than or equal to 90% of their needs  MONITOR:   PO intake, Supplement acceptance, Weight trends  REASON FOR ASSESSMENT:   Consult Assessment of nutrition requirement/status  ASSESSMENT:   89 y.o. male with PMH idiopathic chronic pancreatitis and HTN who presented with syncope and acute pancreatitis.   Patient reports a UBW close to 200# and weight loss over the past couple months due to getting his teeth pulled for dentures. Shares this resulted in some trouble chewing and a diet change to softer foods.  Per EMR, patient weighed at 192# in October and dropped to 175# by 1/17. This is a 17# or 9% weight loss in ~2.5 months, which is significant for the time frame. Weight appears stable since that time however no weight taken since admit weight on 1/28.  Patient endorses typically eating around 2 meals a day at home, breakfast and dinner. Also drinks 1 Ensure High Protein at home.   He has been eating fairly well since admission. Documented to be consuming 10-100% with average of 53%. Notes that as he has been served 3 meals a day, has been eating 3 meals daily since admission. Reports he has his dentures and has been able to chew better.  Had 1 Ensure High Protein at bedside. He is agreeable to getting Ensure ordered this admission, likes strawberry. Encouraged patient to continue to eat  3 meals a day.   Medications reviewed and include: 100mg  thiamine , Coumadin   Labs reviewed:  K+ 3.4 HA1C 6.2 Blood Glucose 110-153 x24 hours   NUTRITION - FOCUSED PHYSICAL EXAM:  Flowsheet Row Most Recent Value  Orbital Region Moderate depletion  Upper Arm Region Mild depletion  Thoracic and Lumbar Region Severe depletion  Buccal Region Severe depletion  Temple Region Severe depletion  Clavicle Bone Region Severe depletion  Clavicle and Acromion Bone Region Severe depletion  Scapular Bone Region Unable to assess  Dorsal Hand Moderate depletion  Patellar Region Moderate depletion  Anterior Thigh Region Moderate depletion  Posterior Calf Region Mild depletion  Edema (RD Assessment) None  Hair Reviewed  Eyes Reviewed  Mouth Reviewed  Skin Reviewed  Nails Reviewed       Diet Order:   Diet Order             Diet regular Room service appropriate? Yes; Fluid consistency: Thin  Diet effective now                   EDUCATION NEEDS:  Education needs have been addressed  Skin:  Skin Assessment: Reviewed RN Assessment  Last BM:  2/3 - type 6  Height:  Ht Readings from Last 1 Encounters:  04/22/23 6' 1 (1.854 m)   Weight:  Wt Readings from Last 1 Encounters:  04/22/23 79 kg   BMI:  Body mass index is 22.98 kg/m.  Estimated Nutritional Needs:  Kcal:  1800-2000 kcals Protein:  95-110  grams Fluid:  >/= 1.8L    Trude Ned RD, LDN Contact via Secure Chat.

## 2023-04-30 DIAGNOSIS — R55 Syncope and collapse: Secondary | ICD-10-CM | POA: Diagnosis not present

## 2023-04-30 DIAGNOSIS — E43 Unspecified severe protein-calorie malnutrition: Secondary | ICD-10-CM | POA: Insufficient documentation

## 2023-04-30 LAB — CBC
HCT: 21.3 % — ABNORMAL LOW (ref 39.0–52.0)
Hemoglobin: 6.6 g/dL — CL (ref 13.0–17.0)
MCH: 28 pg (ref 26.0–34.0)
MCHC: 31 g/dL (ref 30.0–36.0)
MCV: 90.3 fL (ref 80.0–100.0)
Platelets: 460 10*3/uL — ABNORMAL HIGH (ref 150–400)
RBC: 2.36 MIL/uL — ABNORMAL LOW (ref 4.22–5.81)
RDW: 17.3 % — ABNORMAL HIGH (ref 11.5–15.5)
WBC: 15.2 10*3/uL — ABNORMAL HIGH (ref 4.0–10.5)
nRBC: 0 % (ref 0.0–0.2)

## 2023-04-30 LAB — GLUCOSE, CAPILLARY
Glucose-Capillary: 116 mg/dL — ABNORMAL HIGH (ref 70–99)
Glucose-Capillary: 163 mg/dL — ABNORMAL HIGH (ref 70–99)
Glucose-Capillary: 128 mg/dL — ABNORMAL HIGH (ref 70–99)
Glucose-Capillary: 135 mg/dL — ABNORMAL HIGH (ref 70–99)

## 2023-04-30 LAB — BASIC METABOLIC PANEL
Anion gap: 7 (ref 5–15)
BUN: 9 mg/dL (ref 8–23)
CO2: 22 mmol/L (ref 22–32)
Calcium: 8.1 mg/dL — ABNORMAL LOW (ref 8.9–10.3)
Chloride: 107 mmol/L (ref 98–111)
Creatinine, Ser: 0.41 mg/dL — ABNORMAL LOW (ref 0.61–1.24)
GFR, Estimated: >60 mL/min (ref >60)
Glucose, Bld: 116 mg/dL — ABNORMAL HIGH (ref 70–99)
Potassium: 3.3 mmol/L — ABNORMAL LOW (ref 3.5–5.1)
Sodium: 136 mmol/L (ref 135–145)

## 2023-04-30 LAB — PROTIME-INR
INR: 2.5 — ABNORMAL HIGH (ref 0.8–1.2)
Prothrombin Time: 26.8 s — ABNORMAL HIGH (ref 11.4–15.2)

## 2023-04-30 LAB — HEMOGLOBIN AND HEMATOCRIT, BLOOD
HCT: 21.2 % — ABNORMAL LOW (ref 39.0–52.0)
HCT: 25.9 % — ABNORMAL LOW (ref 39.0–52.0)
Hemoglobin: 6.6 g/dL — CL (ref 13.0–17.0)
Hemoglobin: 8.3 g/dL — ABNORMAL LOW (ref 13.0–17.0)

## 2023-04-30 LAB — CULTURE, BLOOD (ROUTINE X 2)
Culture: NO GROWTH
Culture: NO GROWTH
Special Requests: ADEQUATE
Special Requests: ADEQUATE

## 2023-04-30 LAB — PREPARE RBC (CROSSMATCH)

## 2023-04-30 MED ORDER — LACTATED RINGERS IV SOLN: INTRAVENOUS | Status: AC

## 2023-04-30 MED ORDER — ORAL CARE MOUTH RINSE: 15.0000 mL | OROMUCOSAL | Status: DC | PRN

## 2023-04-30 MED ORDER — POTASSIUM CHLORIDE CRYS ER 20 MEQ PO TBCR
40.0000 meq | EXTENDED_RELEASE_TABLET | Freq: Once | ORAL | Status: AC
  Administered 2023-04-30: 40 meq via ORAL
  Filled 2023-04-30: qty 2

## 2023-04-30 MED ORDER — SODIUM CHLORIDE 0.9% IV SOLUTION
Freq: Once | INTRAVENOUS | Status: AC
Start: 2023-04-30 — End: 2023-04-30

## 2023-04-30 NOTE — Progress Notes (Signed)
 Mobility Specialist - Progress Note   04/30/23 1050  Mobility  Activity Dangled on edge of bed  Level of Assistance Standby assist, set-up cues, supervision of patient - no hands on  Assistive Device None  Range of Motion/Exercises Active  Activity Response Tolerated poorly  Mobility Referral Yes  Mobility visit 1 Mobility  Mobility Specialist Start Time (ACUTE ONLY) 1050  Mobility Specialist Stop Time (ACUTE ONLY) 1101  Mobility Specialist Time Calculation (min) (ACUTE ONLY) 11 min   Received in bed and agreed to mobility. Upon sitting EOB pt felt dizzy. After a minute it did NOT subside. BP reading sitting was 123/67 with a HR of 120. Opted to return supine in bed and was left there with all needs met.  Christian Crawford Georgina Mobility Specialist ,

## 2023-04-30 NOTE — Progress Notes (Signed)
 PHARMACY - ANTICOAGULATION CONSULT NOTE  Pharmacy Consult for Coumadin  Indication: atrial fibrillation  Allergies  Allergen Reactions   Lisinopril Cough    Patient Measurements: Height: 6' 1 (185.4 cm) Weight: 77.2 kg (170 lb 3.1 oz) IBW/kg (Calculated) : 79.9  Vital Signs: Temp: 98.1 F (36.7 C) (02/05 1258) Temp Source: Oral (02/05 1258) BP: 119/65 (02/05 1258) Pulse Rate: 98 (02/05 1258)  Labs: Recent Labs    04/28/23 0521 04/29/23 0545 04/30/23 0537 04/30/23 0937  HGB 8.4* 7.5* 6.6* 6.6*  HCT 26.8* 24.3* 21.3* 21.2*  PLT 604* 557* 460*  --   LABPROT 30.0* 26.1* 26.8*  --   INR 2.8* 2.4* 2.5*  --   CREATININE 0.67 0.63 0.41*  --     Estimated Creatinine Clearance: 69.7 mL/min (A) (by C-G formula based on SCr of 0.41 mg/dL (L)).   Assessment: 61 yoM with PMH HTN, DM2, on warfarin for Afib & prior TIA presents 1/28 after a syncopal episode. Patient was actually admitted the day before with chest wall hematoma and elevated INR, but had been discharged the following morning after successful INR reversal with Vit K.   PTA Warfarin regimen: 10mg  daily except 5mg  MWF; last dose 1/27    Significant events: - 1/27: INR is 7.8, Vit K 5 mg IV x 1  - 1/29: INR 1.9, Coumadin  5mg  ordered - NOT CHARTED (SZP)  Today, 04/29/2023: Hgb down to 6.6 today, confirmed on repeat H/H No overt s/s bleeding per RN FOBT ordered but unable to collect; RN notes stool still brown without discoloration Per MD, pt had reported some shoulder pain, and imaging shows possible subcapsularis hematoma Pt does still report dizziness w/ standing Plt slightly elevated but stable INR remains therapeutic & stable Major drug interactions: none noted; recent abx may increase warfarin sensitivity Eating 100% of (charted) meals, improved from previous   Goal of Therapy:  INR 2-3 Monitor platelets by anticoagulation protocol: Yes   Plan:  After discussion with MD, will hold warfarin today given  concerning drop in Hgb Daily INR; CBC q72 hr while on warfarin Monitor for s/s bleeding or thrombosis   Thank you for allowing pharmacy to be a part of this patient's care.  Bard Jeans, PharmD, BCPS [phone] 04/30/2023, 3:11 PM

## 2023-04-30 NOTE — Evaluation (Signed)
 Clinical/Bedside Swallow Evaluation Patient Details  Name: Christian Crawford MRN: [id number] Date of Birth: 20-Jul-1933  Today's Date: 04/30/2023 Time: SLP Start Time (ACUTE ONLY): 1120 SLP Stop Time (ACUTE ONLY): 1140 SLP Time Calculation (min) (ACUTE ONLY): 20 min  Past Medical History:  Past Medical History:  Diagnosis Date   A-fib (HCC)    Acute alcoholic pancreatitis 07/24/2019   Diverticulitis 11/13/2021   Heavy alcohol use 07/24/2019   Hematuria 11/14/2021   Hypertension    TIA (transient ischemic attack)    Past Surgical History:  Past Surgical History:  Procedure Laterality Date   EYE SURGERY     had droopy eye   HPI:  89 yr old man who presented to [facility] ED on 04/22/2023 with complaints of syncope. He stopped at a convenience store on his way home from [facility], got dizzy and passed out in the store. He did not strike his head. Exam in the ED was nonfocal.   Of note,  pt was just d/c from ED same day d/t being seen for supratherapeutic INR.SABRA It was reversed with vitamin K . The INR was reduced to 1.5, the patient was discharged on Lovenox . CXR without acute disease.    Assessment / Plan / Recommendation  Clinical Impression  Patient presents with a suspected primary esophageal dysphagia. He reports a h/o esophageal deficits (unable to specify) characterized by need to have MD go in and clear out esophagus. Suspect h/o esophageal stenosis requiring stretching which would coincide with his symptoms at bedside which included consistent belching post swallow with thin liquids, reports of globus, particularly on the left side, and occassional regugitation of bolus. No overt indication of aspiration. CXR clear and patient without reports of any recent pulmonary dx. He is independently compensating with small bites/sip, choosing moist solids, and using liquid wash as needed. He also regularly sees his GI MD for pancreatitis and according to daughter, has a f/u endoscopy planned for March. They  are hopeful to get his moved up. No further acute SLP needs indicated. All education complete. Do recommend GI f/u for possible esophageal deficits, inpatient would be ideal but if unable, recommend f/u with regular GI MD as OP. SLP Visit Diagnosis: Dysphagia, pharyngoesophageal phase (R13.14)    Aspiration Risk  Mild aspiration risk    Diet Recommendation Regular;Thin liquid (Patient can independently choose softer solid items)    Liquid Administration via: Cup;Straw Medication Administration: Whole meds with liquid Supervision: Patient able to self feed Compensations: Slow rate;Small sips/bites;Follow solids with liquid Postural Changes: Seated upright at 90 degrees;Remain upright for at least 30 minutes after po intake    Other  Recommendations Recommended Consults: Consider GI evaluation;Consider esophageal assessment Oral Care Recommendations: Oral care BID    Recommendations for follow up therapy are one component of a multi-disciplinary discharge planning process, led by the attending physician.  Recommendations may be updated based on patient status, additional functional criteria and insurance authorization.  Follow up Recommendations No SLP follow up         Functional Status Assessment Patient has not had a recent decline in their functional status    Swallow Study   General HPI: 89 yr old man who presented to [facility] ED on 04/22/2023 with complaints of syncope. He stopped at a convenience store on his way home from [facility] got dizzy and passed out in the store. He did not strike his head. Exam in the ED was nonfocal.   Of note,  pt was just d/c from ED same day  d/t being seen for supratherapeutic INR.SABRA It was reversed with vitamin K . The INR was reduced to 1.5, the patient was discharged on Lovenox . CXR without acute disease. Type of Study: Bedside Swallow Evaluation Previous Swallow Assessment: none Diet Prior to this Study: Regular;Thin liquids (Level 0) Temperature Spikes  Noted: No Respiratory Status: Room air History of Recent Intubation: No Behavior/Cognition: Alert;Cooperative;Pleasant mood Oral Cavity Assessment: Within Functional Limits Oral Care Completed by SLP: No Oral Cavity - Dentition: Dentures, top;Adequate natural dentition Vision: Functional for self-feeding Self-Feeding Abilities: Able to feed self Patient Positioning: Upright in bed Baseline Vocal Quality: Normal Volitional Cough: Strong Volitional Swallow: Able to elicit    Oral/Motor/Sensory Function Overall Oral Motor/Sensory Function: Within functional limits   Ice Chips Ice chips: Not tested   Thin Liquid Thin Liquid: Impaired Presentation: Cup;Self Fed;Straw Other Comments: belching post swallow    Nectar Thick Nectar Thick Liquid: Not tested   Honey Thick Honey Thick Liquid: Not tested   Puree Puree: Within functional limits Presentation: Spoon;Self Fed   Solid     Solid: Within functional limits Presentation: Self Fed     [facility] MA, CCC-SLP  Dela Sweeny Meryl 04/30/2023,11:45 AM

## 2023-04-30 NOTE — Plan of Care (Signed)
  Problem: Education: Goal: Ability to describe self-care measures that may prevent or decrease complications (Diabetes Survival Skills Education) will improve Outcome: Progressing Goal: Individualized Educational Video(s) Outcome: Progressing   Problem: Coping: Goal: Ability to adjust to condition or change in health will improve Outcome: Progressing   Problem: Fluid Volume: Goal: Ability to maintain a balanced intake and output will improve Outcome: Progressing   Problem: Health Behavior/Discharge Planning: Goal: Ability to identify and utilize available resources and services will improve Outcome: Progressing   Problem: Metabolic: Goal: Ability to maintain appropriate glucose levels will improve Outcome: Progressing   Problem: Nutritional: Goal: Maintenance of adequate nutrition will improve Outcome: Progressing   Problem: Skin Integrity: Goal: Risk for impaired skin integrity will decrease Outcome: Not Progressing Note: Pt was unable to walk today because of feeling light headed. Pt refused help to turn stating that he can turn himself.

## 2023-04-30 NOTE — Progress Notes (Signed)
 PROGRESS NOTE Christian Crawford  FMW:[id number] DOB: 27-Aug-1933 DOA: 04/22/2023 PCP: Maree Leni Edyth DELENA, MD  Brief Narrative/Hospital Course: 89 yr old man who presented to [facility] ED on 04/22/2023 with complaints of syncope after he had been discharged to home earlier in the day after a brief admission for a supertherapeutic INR. He was on coumadin  for atrial fibrillation, and presented to a spontaneous left chest wall hematoma. It was reversed with vitamin K . The INR was reduced to 1.5, and the patient was discharged on Lovenox . He stopped at a convenience store on his way home, got dizzy and passed out in the store. He did not strike his head. Exam in the ED was nonfocal. The patient did answer questions appropriately. Earlier this month the patient had undergone an MRCP and had visited GI which demonstrated an abnormal gallbladder up to 4.3 cm with circumferential wall thickening, also dilatation of the pancreatic duct and abnormal MRCP. Abdominal ultrasound on 04/22/2023 demonstrated no evidence of acute cholecystitis. CTA of the chest and abdomen demonstrated acute pancreatitis with possibly developing pseudocyst. GI was consulted and determined that no ERCP was necessary. Recommendation is that the patient follow up with [facility] GI as previously planned as outpatient. EEG and echocardiogram obtained including CT head CT chest abdomen pelvis as above with no acute finding EF 65-70% G1 DD, EEG without seizures. Patient had bacteremia in the blood culture and managed antibiotics pending further culture sensitivity. Patient was also treated for acute pancreatitis on chronic pancreatitis patient CT scan.    Subjective: Seen and examined this morning Alert awake resting comfortably no abdominal pain nausea vomiting fever chills Overnight afebrile BP stable mild tachycardia yesterday  Labs shows mild hypokalemia, leukocytosis downtrending  12>17> 19>15k but HB down to 6.6 -rechecking INR 2.5  stable   Assessment and Plan: Principal Problem:   Syncope and collapse Active Problems:   Acute pancreatitis   Abnormal magnetic resonance cholangiopancreatography (MRCP)   Leucocytosis   Benign essential hypertension   Paroxysmal atrial fibrillation (HCC)   Anemia   Abnormal CT of the abdomen   Idiopathic chronic pancreatitis (HCC)   Protein-calorie malnutrition, severe   Syncope: exact etiology is unknown but could be related to multifactorial reasons.  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  No recurrence of syncope, continue ambulation with PT OT.  Check orthostatic vitals.   Positive blood culture as below: Felt to be contamination BCID with Staphylococcus, from 1/29- grew staph hemolyticus likely contamination and Streptococcus from 1/28> Peridin-C Streptococcus final. Repeat blood culture 1/31 no growth to date.  Discussed with Dr. Fleeta Rothman from ID-it is felt to be contamination and antibiotics discontinued 04/28/23    Acute on chronic pancreatitis by CT scan: MRCP showed : Dilatation of the pancreatic duct measuring up to 0.7 cm, with abrupt caliber change in the pancreatic head neck junction. Diffusely atrophic pancreatic parenchyma distal to this point.Generally normal appearance of the pancreatic head. This appearancehas evolved over prior examinations dating back to 07/24/2019 andmost likely reflects chronic sequelae of pancreatitis, without discretely visible mass. 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 This requires no specific further follow-up given small size and advanced patient age. No acute inflammatory findings in the abdomen at this time.  Discussed with GI and General surgery. It is felt that these changes are chronic. No need for intervention. They patient is urged to follow up with his GI in [facility] and he has an appointment with GI [facility] on June 20, 2023. That is the earliest appointment family could get. GI recommends to follow-up with [facility]. Lipid panel LDL 39 triglycerides 76 cholesterol 86.  No abdomen pain or tenderness having good bowel movement tolerating diet.  Leukocytosis: Without fever unclear etiology question reactive in the setting of pancreatitis versus other etiology ?Small left subscapularis hematoma related. Checked UA, CXR,-without evidence of infection, Procal  0.29, CRP some up 8.8.  Blood culture repeated 2/4 no growth to date.   Given gentle IV hydration with improving WBC count .of note patient has had chronic leukocytosis for past 3 to 4 years  He recently had CT scan on 1/22,1/27,1/28 and no indication for rechecking given improving counts and benign abdominal exam Recent Labs  Lab 04/26/23 0700 04/27/23 0539 04/28/23 0521 04/29/23 0545 04/29/23 1353 04/30/23 0537  WBC 11.0* 12.4* 17.3* 19.2*  --  15.2*  PROCALCITON  --   --   --   --  0.29  --     Essential hypertension: BP controlled.  Continue Norvasc  hydralazine, metoprolol  losartan  and metoprolol .   Hypokalemia: Replaced.  PAF: Rate controlled on metoprolol .  INR therapeutic on Coumadin  pharmacy dosing as below  Recent Labs  Lab 04/26/23 0700 04/27/23 0539 04/28/23 0521 04/29/23 0545 04/30/23 0537  INR 2.7* 3.0* 2.8* 2.4* 2.5*    Chronic anemia: hb trended down partly related to hemodilution, INR was 7.8 on 04/21/2023, INR stable no evidence of acute  bleeding-FOBT unable to be done- but stool is brown color -asked RN to check FOBT today.  Hemoglobin down at 6.6 g on recheck patient agreeable for 1 unit PRBC transfusion and have ordered.Anemia panel showed iron deficiency Recent Labs    04/26/23 0700 04/27/23 0539 04/28/23 0521 04/29/23 0545 04/30/23 0537  HGB 7.6* 7.9* 8.4* 7.5* 6.6*  MCV 88.3 88.6 88.2 88.0 90.3  VITAMINB12  --   --  697  --   --   FOLATE  --   --  12.3  --   --   FERRITIN  --   --  378*  --   --   TIBC  --   --  151*  --   --   IRON  --   --  21*  --   --   RETICCTPCT  --   --  5.4*  --   --     Glaucoma: cont latanoprost  eyedrops  DVT prophylaxis: Coumadin -therapeutic INR Code Status:   Code Status: Full Code Family Communication: plan of care discussed with patient/ no daughter at bedside today. Patient status is: Remains hospitalized because of severity of illness Level of care: Telemetry   Dispo: The patient is from: home            Anticipated disposition: TBD 24 hrs. Objective: Vitals last 24 hrs: Vitals:   04/30/23 0106 04/30/23 0500 04/30/23 0600 04/30/23 0711  BP: 110/60   133/70  Pulse: 83   93  Resp: 18   17  Temp: 99.3 F (37.4 C)  98.5 F (36.9 C)  TempSrc:      SpO2: 100%   100%  Weight:  77.2 kg 77.2 kg   Height:       Weight change:   Physical Examination: General exam: alert awake, oriented HEENT:Oral mucosa moist, Ear/Nose WNL grossly Respiratory system: Bilaterally clear BS,no use of accessory muscle Cardiovascular system: S1 & S2 +, No JVD. Gastrointestinal system: Abdomen soft,NT,ND, BS+ Nervous System: Alert, awake, moving all extremities,and following commands. Extremities: LE edema neg,distal peripheral pulses palpable and warm.  Skin: No rashes,no icterus. MSK: Normal muscle bulk,tone, power   Medications reviewed:  Scheduled Meds:  amLODipine   10 mg Oral Daily   feeding supplement  237 mL Oral Q24H   insulin  aspart  0-6 Units Subcutaneous TID WC    latanoprost   1 drop Both Eyes QHS   losartan   25 mg Oral Daily   metoprolol  tartrate  75 mg Oral BID   multivitamin with minerals  1 tablet Oral Daily   thiamine   100 mg Oral Daily   Warfarin - Pharmacist Dosing Inpatient   Does not apply q1600   Continuous Infusions:  lactated ringers  125 mL/hr at 04/29/23 1554    Diet Order             Diet regular Room service appropriate? Yes; Fluid consistency: Thin  Diet effective now                  Intake/Output Summary (Last 24 hours) at 04/30/2023 0905 Last data filed at 04/30/2023 0840 Gross per 24 hour  Intake 720.36 ml  Output --  Net 720.36 ml   Net IO Since Admission: 510.83 mL [04/30/23 0905]  Wt Readings from Last 3 Encounters:  04/30/23 77.2 kg  04/21/23 79.4 kg  04/11/23 79.4 kg     Unresulted Labs (From admission, onward)     Start     Ordered   04/30/23 0820  Hemoglobin and hematocrit, blood  Once,   R       Question:  Specimen collection method  Answer:  Lab=Lab collect   04/30/23 0819   04/30/23 0820  Type and screen [facility]  Once,   R       Comments: [facility]    04/30/23 0819   04/29/23 0500  Basic metabolic panel  Daily,   R     Question:  Specimen collection method  Answer:  Lab=Lab collect   04/28/23 1123   04/29/23 0500  CBC  Daily,   R     Question:  Specimen collection method  Answer:  Lab=Lab collect   04/28/23 1123   04/27/23 1139  Occult blood card to lab, stool  Once,   R        04/27/23 1138   04/23/23 0500  Protime-INR  Daily,   R      04/22/23 2320           Data Reviewed: I have personally reviewed following labs and imaging studies CBC: Recent Labs  Lab 04/24/23 0552 04/25/23 0920 04/26/23 0700 04/27/23 0539 04/28/23 0521 04/29/23 0545 04/30/23 0537  WBC 14.0* 14.1* 11.0* 12.4* 17.3* 19.2* 15.2*  NEUTROABS 8.6* 9.2* 6.5 8.1*  --   --   --   HGB 7.7* 7.9* 7.6* 7.9* 8.4* 7.5* 6.6*  HCT 24.3* 25.3* 24.1* 24.9* 26.8* 24.3* 21.3*   MCV 86.8 88.8 88.3 88.6 88.2 88.0 90.3  PLT 570* 602* 556* 559* 604* 557* 460*   Basic  Metabolic Panel:  Recent Labs  Lab 04/26/23 0700 04/27/23 0539 04/28/23 0521 04/29/23 0545 04/30/23 0537  NA 138 136 137 136 136  K 3.9 3.8 3.9 3.4* 3.3*  CL 108 108 109 106 107  CO2 22 21* 21* 21* 22  GLUCOSE 112* 102* 106* 124* 116*  BUN 10 8 8 12 9   CREATININE 0.65 0.63 0.67 0.63 0.41*  CALCIUM  8.1* 8.2* 8.4* 8.2* 8.1*   GFR: Estimated Creatinine Clearance: 69.7 mL/min (A) (by C-G formula based on SCr of 0.41 mg/dL (L)). Liver Function Tests:  Recent Labs  Lab 04/28/23 0521  AST 18  ALT 10  ALKPHOS 67  BILITOT 1.2  PROT 6.0*  ALBUMIN 2.8*   Recent Labs  Lab 04/26/23 0700 04/27/23 0539 04/28/23 0521 04/29/23 0545 04/30/23 0537  INR 2.7* 3.0* 2.8* 2.4* 2.5*  Sepsis Labs: Recent Labs  Lab 04/29/23 1353  PROCALCITON 0.29   Recent Results (from the past 240 hours)  Culture, blood (Routine X 2) w Reflex to ID Panel     Status: Abnormal   Collection Time: 04/22/23  9:53 PM   Specimen: BLOOD RIGHT FOREARM  Result Value Ref Range Status   Specimen Description   Final    BLOOD RIGHT FOREARM Performed at [facility], [address] [place], [place] [address]    Special Requests   Final    BOTTLES DRAWN AEROBIC AND ANAEROBIC Blood Culture results may not be optimal due to an inadequate volume of blood received in culture bottles Performed at [facility], [address], [place], [place] [address]    Culture  Setup Time   Final    GRAM POSITIVE COCCI IN CHAINS AEROBIC BOTTLE ONLY CRITICAL RESULT CALLED TO, READ BACK BY AND VERIFIED WITH: PHARMD N. GLOGOVAC [id number] @ 2120 FH  Performed at [facility], [address] [place], [place] [address]    Culture VIRIDANS STREPTOCOCCUS (A)  Final   Report Status 04/26/2023 FINAL  Final   Organism ID, Bacteria VIRIDANS STREPTOCOCCUS  Final      Susceptibility   Viridans streptococcus - MIC*     PENICILLIN <=0.06 SENSITIVE Sensitive     CEFTRIAXONE  <=0.12 SENSITIVE Sensitive     LEVOFLOXACIN 4 INTERMEDIATE Intermediate     VANCOMYCIN  <=0.12 SENSITIVE Sensitive     * VIRIDANS STREPTOCOCCUS  Blood Culture ID Panel (Reflexed)     Status: Abnormal   Collection Time: 04/22/23  9:53 PM  Result Value Ref Range Status   Enterococcus faecalis NOT DETECTED NOT DETECTED Final   Enterococcus Faecium NOT DETECTED NOT DETECTED Final   Listeria monocytogenes NOT DETECTED NOT DETECTED Final   Staphylococcus species NOT DETECTED NOT DETECTED Final   Staphylococcus aureus (BCID) NOT DETECTED NOT DETECTED Final   Staphylococcus epidermidis NOT DETECTED NOT DETECTED Final   Staphylococcus lugdunensis NOT DETECTED NOT DETECTED Final   Streptococcus species DETECTED (A) NOT DETECTED Final    Comment: Not Enterococcus species, Streptococcus agalactiae, Streptococcus pyogenes, or Streptococcus pneumoniae. CRITICAL RESULT CALLED TO, READ BACK BY AND VERIFIED WITH: PHARMD N. GLOGOVAC [id number] @ 2120 FH     Streptococcus agalactiae NOT DETECTED NOT DETECTED Final   Streptococcus pneumoniae NOT DETECTED NOT DETECTED Final   Streptococcus pyogenes NOT DETECTED NOT DETECTED Final   A.calcoaceticus-baumannii NOT DETECTED NOT DETECTED Final   Bacteroides fragilis NOT DETECTED NOT DETECTED Final   Enterobacterales NOT DETECTED NOT DETECTED Final   Enterobacter cloacae complex NOT DETECTED NOT DETECTED Final   Escherichia  coli NOT DETECTED NOT DETECTED Final   Klebsiella aerogenes NOT DETECTED NOT DETECTED Final   Klebsiella oxytoca NOT DETECTED NOT DETECTED Final   Klebsiella pneumoniae NOT DETECTED NOT DETECTED Final   Proteus species NOT DETECTED NOT DETECTED Final   Salmonella species NOT DETECTED NOT DETECTED Final   Serratia marcescens NOT DETECTED NOT DETECTED Final   Haemophilus influenzae NOT DETECTED NOT DETECTED Final   Neisseria meningitidis NOT DETECTED NOT DETECTED Final   Pseudomonas  aeruginosa NOT DETECTED NOT DETECTED Final   Stenotrophomonas maltophilia NOT DETECTED NOT DETECTED Final   Candida albicans NOT DETECTED NOT DETECTED Final   Candida auris NOT DETECTED NOT DETECTED Final   Candida glabrata NOT DETECTED NOT DETECTED Final   Candida krusei NOT DETECTED NOT DETECTED Final   Candida parapsilosis NOT DETECTED NOT DETECTED Final   Candida tropicalis NOT DETECTED NOT DETECTED Final   Cryptococcus neoformans/gattii NOT DETECTED NOT DETECTED Final    Comment: Performed at [facility], [address], [place], [place] [address]  Culture, blood (Routine X 2) w Reflex to ID Panel     Status: Abnormal   Collection Time: 04/22/23  9:54 PM   Specimen: BLOOD LEFT FOREARM  Result Value Ref Range Status   Specimen Description   Final    BLOOD LEFT FOREARM Performed at [facility], [address], [place], [place] [address]    Special Requests   Final    BOTTLES DRAWN AEROBIC ONLY Blood Culture results may not be optimal due to an inadequate volume of blood received in culture bottles Performed at [facility], [address], [place], [place] [address]    Culture  Setup Time   Final    GRAM POSITIVE COCCI IN CLUSTERS AEROBIC BOTTLE ONLY CRITICAL RESULT CALLED TO, READ BACK BY AND VERIFIED WITH: PHARMD N. GLOGOVAC [id number] @ 2120 FH     Culture (A)  Final    STAPHYLOCOCCUS HAEMOLYTICUS THE SIGNIFICANCE OF ISOLATING THIS ORGANISM FROM A SINGLE SET OF BLOOD CULTURES WHEN MULTIPLE SETS ARE DRAWN IS UNCERTAIN. PLEASE NOTIFY THE MICROBIOLOGY DEPARTMENT WITHIN ONE WEEK IF SPECIATION AND SENSITIVITIES ARE REQUIRED. Performed at [facility], [address] [place], [place] [address]    Report Status 04/25/2023 FINAL  Final  Respiratory (~20 pathogens) panel by PCR     Status: None   Collection Time: 04/22/23 11:00 PM   Specimen: Nasopharyngeal Swab; Respiratory  Result Value Ref Range Status   Adenovirus NOT DETECTED NOT DETECTED  Final   Coronavirus 229E NOT DETECTED NOT DETECTED Final    Comment: (NOTE) The Coronavirus on the Respiratory Panel, DOES NOT test for the novel  Coronavirus (2019 nCoV)    Coronavirus HKU1 NOT DETECTED NOT DETECTED Final   Coronavirus NL63 NOT DETECTED NOT DETECTED Final   Coronavirus OC43 NOT DETECTED NOT DETECTED Final   Metapneumovirus NOT DETECTED NOT DETECTED Final   Rhinovirus / Enterovirus NOT DETECTED NOT DETECTED Final   Influenza A NOT DETECTED NOT DETECTED Final   Influenza B NOT DETECTED NOT DETECTED Final   Parainfluenza Virus 1 NOT DETECTED NOT DETECTED Final   Parainfluenza Virus 2 NOT DETECTED NOT DETECTED Final   Parainfluenza Virus 3 NOT DETECTED NOT DETECTED Final   Parainfluenza Virus 4 NOT DETECTED NOT DETECTED Final   Respiratory Syncytial Virus NOT DETECTED NOT DETECTED Final   Bordetella pertussis NOT DETECTED NOT DETECTED Final   Bordetella Parapertussis NOT DETECTED NOT DETECTED Final   Chlamydophila pneumoniae  NOT DETECTED NOT DETECTED Final   Mycoplasma pneumoniae NOT DETECTED NOT DETECTED Final    Comment: Performed at [facility], [address], [place], [place] [address]  Blood Culture ID Panel (Reflexed)     Status: Abnormal   Collection Time: 04/23/23  9:46 AM  Result Value Ref Range Status   Enterococcus faecalis NOT DETECTED NOT DETECTED Final   Enterococcus Faecium NOT DETECTED NOT DETECTED Final   Listeria monocytogenes NOT DETECTED NOT DETECTED Final   Staphylococcus species DETECTED (A) NOT DETECTED Final    Comment: CRITICAL RESULT CALLED TO, READ BACK BY AND VERIFIED WITH: MAYA GEANNIE ROLLER [id number] @ 2120 FH     Staphylococcus aureus (BCID) NOT DETECTED NOT DETECTED Final   Staphylococcus epidermidis NOT DETECTED NOT DETECTED Final   Staphylococcus lugdunensis NOT DETECTED NOT DETECTED Final   Streptococcus species NOT DETECTED NOT DETECTED Final   Streptococcus agalactiae NOT DETECTED NOT DETECTED Final   Streptococcus  pneumoniae NOT DETECTED NOT DETECTED Final   Streptococcus pyogenes NOT DETECTED NOT DETECTED Final   A.calcoaceticus-baumannii NOT DETECTED NOT DETECTED Final   Bacteroides fragilis NOT DETECTED NOT DETECTED Final   Enterobacterales NOT DETECTED NOT DETECTED Final   Enterobacter cloacae complex NOT DETECTED NOT DETECTED Final   Escherichia coli NOT DETECTED NOT DETECTED Final   Klebsiella aerogenes NOT DETECTED NOT DETECTED Final   Klebsiella oxytoca NOT DETECTED NOT DETECTED Final   Klebsiella pneumoniae NOT DETECTED NOT DETECTED Final   Proteus species NOT DETECTED NOT DETECTED Final   Salmonella species NOT DETECTED NOT DETECTED Final   Serratia marcescens NOT DETECTED NOT DETECTED Final   Haemophilus influenzae NOT DETECTED NOT DETECTED Final   Neisseria meningitidis NOT DETECTED NOT DETECTED Final   Pseudomonas aeruginosa NOT DETECTED NOT DETECTED Final   Stenotrophomonas maltophilia NOT DETECTED NOT DETECTED Final   Candida albicans NOT DETECTED NOT DETECTED Final   Candida auris NOT DETECTED NOT DETECTED Final   Candida glabrata NOT DETECTED NOT DETECTED Final   Candida krusei NOT DETECTED NOT DETECTED Final   Candida parapsilosis NOT DETECTED NOT DETECTED Final   Candida tropicalis NOT DETECTED NOT DETECTED Final   Cryptococcus neoformans/gattii NOT DETECTED NOT DETECTED Final    Comment: Performed at [facility], [address], [place], [place] [address]  Culture, blood (Routine X 2) w Reflex to ID Panel     Status: None   Collection Time: 04/25/23 12:47 PM   Specimen: BLOOD RIGHT ARM  Result Value Ref Range Status   Specimen Description   Final    BLOOD RIGHT ARM Performed at [facility], [address], [place], [place] [address]    Special Requests   Final    AEROBIC BOTTLE ONLY Blood Culture adequate volume Performed at [facility], [address], [place], [place] [address]    Culture   Final    NO GROWTH 5  DAYS Performed at [facility], [address], [place], [place] [address]    Report Status 04/30/2023 FINAL  Final  Culture, blood (Routine X 2) w Reflex to ID Panel     Status: None   Collection Time: 04/25/23 12:47 PM   Specimen: BLOOD RIGHT HAND  Result Value Ref Range Status   Specimen Description   Final    BLOOD RIGHT HAND Performed at [facility], [address], [place], [place] [address]    Special Requests   Final    AEROBIC BOTTLE  ONLY Blood Culture adequate volume Performed at [facility], [address], [place], [place] [address]    Culture   Final    NO GROWTH 5 DAYS Performed at [facility], [address], [place], [place] [address]    Report Status 04/30/2023 FINAL  Final  Culture, blood (Routine X 2) w Reflex to ID Panel     Status: None (Preliminary result)   Collection Time: 04/29/23  3:16 PM   Specimen: BLOOD RIGHT ARM  Result Value Ref Range Status   Specimen Description   Final    BLOOD RIGHT ARM Performed at [facility], [address] [place], [place] [address]    Special Requests   Final    BOTTLES DRAWN AEROBIC AND ANAEROBIC Blood Culture results may not be optimal due to an inadequate volume of blood received in culture bottles Performed at [facility], [address] [place], [place] [address]    Culture   Final    NO GROWTH < 12 HOURS Performed at [facility], [address] [place], [place] [address]    Report Status PENDING  Incomplete  Culture, blood (Routine X 2) w Reflex to ID Panel     Status: None (Preliminary result)   Collection Time: 04/29/23  3:25 PM   Specimen: BLOOD RIGHT HAND  Result Value Ref Range Status   Specimen Description   Final    BLOOD RIGHT HAND Performed at [facility], [address], [place], [place] [address]    Special Requests   Final    BOTTLES DRAWN AEROBIC AND ANAEROBIC Blood Culture results may not be optimal  due to an inadequate volume of blood received in culture bottles Performed at [facility], [address] [place], [place] [address]    Culture   Final    NO GROWTH < 12 HOURS Performed at [facility], [address] [place], [place] [address]    Report Status PENDING  Incomplete    Antimicrobials/Microbiology: Anti-infectives (From admission, onward)    Start     Dose/Rate Route Frequency Ordered Stop   04/25/23 1400  vancomycin  (VANCOREADY) IVPB 1750 mg/350 mL  Status:  Discontinued        1,750 mg 175 mL/hr over 120 Minutes Intravenous Every 24 hours 04/25/23 1310 04/28/23 1305   04/22/23 2330  piperacillin -tazobactam (ZOSYN ) IVPB 3.375 g  Status:  Discontinued        3.375 g 12.5 mL/hr over 240 Minutes Intravenous Every 8 hours 04/22/23 2310 04/28/23 1305         Component Value Date/Time   SDES  04/29/2023 1525    BLOOD RIGHT HAND Performed at [facility], [address] [place], [place] [address]    SPECREQUEST  04/29/2023 1525    BOTTLES DRAWN AEROBIC AND ANAEROBIC Blood Culture results may not be optimal due to an inadequate volume of blood received in culture bottles Performed at [facility], [address] [place], [place] [address]    CULT  04/29/2023 1525    NO GROWTH < 12 HOURS Performed at [facility], [address], [place], [place] [address]    REPTSTATUS PENDING 04/29/2023 1525     Radiology Studies: [facility] Chest Port 1 View Result Date: 04/29/2023 CLINICAL DATA:  Leukocytosis. EXAM: PORTABLE CHEST 1 VIEW COMPARISON:  Chest x-ray CT chest dated April 22, 2023. FINDINGS: The heart size and  mediastinal contours are within normal limits. Both lungs are clear. The visualized skeletal structures are unremarkable. IMPRESSION: No active disease. Electronically Signed   By: Elsie ONEIDA Shoulder M.D.   On: 04/29/2023 15:24     LOS: 7 days   Total time spent in review of labs and imaging, patient  evaluation, formulation of plan, documentation and communication with family: 35 minutes  Mennie LAMY, MD  [facility]  04/30/2023, 9:05 AM

## 2023-04-30 NOTE — Plan of Care (Signed)

## 2023-05-01 DIAGNOSIS — R55 Syncope and collapse: Secondary | ICD-10-CM | POA: Diagnosis not present

## 2023-05-01 LAB — BPAM RBC
Blood Product Expiration Date: 202503022359
ISSUE DATE / TIME: 202502051237
Unit Type and Rh: 6200

## 2023-05-01 LAB — BASIC METABOLIC PANEL WITH GFR
Anion gap: 8 (ref 5–15)
BUN: 7 mg/dL — ABNORMAL LOW (ref 8–23)
CO2: 23 mmol/L (ref 22–32)
Calcium: 8 mg/dL — ABNORMAL LOW (ref 8.9–10.3)
Chloride: 104 mmol/L (ref 98–111)
Creatinine, Ser: 0.59 mg/dL — ABNORMAL LOW (ref 0.61–1.24)
GFR, Estimated: >60 mL/min
Glucose, Bld: 105 mg/dL — ABNORMAL HIGH (ref 70–99)
Potassium: 3.9 mmol/L (ref 3.5–5.1)
Sodium: 135 mmol/L (ref 135–145)

## 2023-05-01 LAB — CBC
HCT: 25 % — ABNORMAL LOW (ref 39.0–52.0)
Hemoglobin: 7.9 g/dL — ABNORMAL LOW (ref 13.0–17.0)
MCH: 27.9 pg (ref 26.0–34.0)
MCHC: 31.6 g/dL (ref 30.0–36.0)
MCV: 88.3 fL (ref 80.0–100.0)
Platelets: 436 K/uL — ABNORMAL HIGH (ref 150–400)
RBC: 2.83 MIL/uL — ABNORMAL LOW (ref 4.22–5.81)
RDW: 16.8 % — ABNORMAL HIGH (ref 11.5–15.5)
WBC: 10.5 K/uL (ref 4.0–10.5)
nRBC: 0 % (ref 0.0–0.2)

## 2023-05-01 LAB — TYPE AND SCREEN
ABO/RH(D): AB POS
Antibody Screen: NEGATIVE
Unit division: 0

## 2023-05-01 LAB — PROTIME-INR
INR: 2.4 — ABNORMAL HIGH (ref 0.8–1.2)
Prothrombin Time: 26.2 s — ABNORMAL HIGH (ref 11.4–15.2)

## 2023-05-01 LAB — GLUCOSE, CAPILLARY
Glucose-Capillary: 108 mg/dL — ABNORMAL HIGH (ref 70–99)
Glucose-Capillary: 165 mg/dL — ABNORMAL HIGH (ref 70–99)

## 2023-05-01 MED ORDER — WARFARIN SODIUM 5 MG PO TABS: 5.0000 mg | ORAL_TABLET | ORAL | Status: DC

## 2023-05-01 MED ORDER — WARFARIN SODIUM 5 MG PO TABS: 5.0000 mg | ORAL_TABLET | Freq: Once | ORAL | Status: DC

## 2023-05-01 MED ORDER — VITAMIN B-1 100 MG PO TABS: 100.0000 mg | ORAL_TABLET | Freq: Every day | ORAL | 0 refills | Status: AC

## 2023-05-01 MED ORDER — ADULT MULTIVITAMIN W/MINERALS CH: 1.0000 | ORAL_TABLET | Freq: Every day | ORAL | 0 refills | Status: AC

## 2023-05-01 NOTE — Discharge Summary (Signed)
 Physician Discharge Summary  Ambrosio Reuter FMW:[id number] DOB: 1933/04/08 DOA: 04/22/2023  PCP: Maree Leni Edyth DELENA, MD  Admit date: 04/22/2023 Discharge date: 05/01/2023 Recommendations for Outpatient Follow-up:  Follow up with PCP in 1 weeks-call for appointment Please obtain BMP/CBC in one week  Discharge Dispo: Home w/ [facility] Discharge Condition: Stable Code Status:   Code Status: Full Code Diet recommendation:  Diet Order             Diet - low sodium heart healthy           Diet regular Room service appropriate? Yes; Fluid consistency: Thin  Diet effective now                    Brief/Interim Summary: 89 yr old man who presented to [facility] ED on 04/22/2023 with complaints of syncope after he had been discharged to home earlier in the day after a brief admission for a supertherapeutic INR. He was on coumadin  for atrial fibrillation, and presented to a spontaneous left chest wall hematoma. It was reversed with vitamin K . The INR was reduced to 1.5, and the patient was discharged on Lovenox . He stopped at a convenience store on his way home, got dizzy and passed out in the store. He did not strike his head. Exam in the ED was nonfocal. The patient did answer questions appropriately. Earlier this month the patient had undergone an MRCP and had visited GI which demonstrated an abnormal gallbladder up to 4.3 cm with circumferential wall thickening, also dilatation of the pancreatic duct and abnormal MRCP. Abdominal ultrasound on 04/22/2023 demonstrated no evidence of acute cholecystitis. CTA of the chest and abdomen demonstrated acute pancreatitis with possibly developing pseudocyst. GI was consulted and determined that no ERCP was necessary. Recommendation is that the patient follow up with [facility] GI as previously planned as outpatient. EEG and echocardiogram obtained including CT head CT chest abdomen pelvis as above with no acute finding EF 65-70% G1 DD, EEG without seizures. Patient had  bacteremia in the blood culture and managed antibiotics pending further culture sensitivity. Patient was also treated for acute pancreatitis on chronic pancreatitis patient CT scan. Patient had positive blood culture with Staphylococcus hemolyticus and Streptococcus initially on antibiotics but subsequently discontinued as it was felt to be contamination, after discussion with ID Dr. Lindia.  Patient has a persistent leukocytosis he does have chronic leukocytosis-but remained afebrile abdominal exam benign and asymptomatic tolerating diet.  He had drop in hemoglobin to 6.6 g-switch 1 unit PRBC with appropriate improvement. 2/6 leukocytosis has resolved.  Remains asymptomatic TOLERATING DIET. Agreeable for dc home, family updated.   Discharge Diagnoses:  Principal Problem:   Syncope and collapse Active Problems:   Acute pancreatitis   Abnormal magnetic resonance cholangiopancreatography (MRCP)   Leucocytosis   Benign essential hypertension   Paroxysmal atrial fibrillation (HCC)   Anemia   Abnormal CT of the abdomen   Idiopathic chronic pancreatitis (HCC)   Protein-calorie malnutrition, severe  Syncope: exact etiology is unknown but could be related to multifactorial reasons.  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  No recurrence of syncope, continue ambulation with PT OT.    Positive blood culture as  below: Felt to be contamination: BCID with Staphylococcus, from 1/29- grew staph hemolyticus likely contamination and Streptococcus from 1/28> Peridin-C Streptococcus final. Repeat blood culture 1/31 no growth to date.  Discussed with Dr.  Fleeta Rothman from ID-it is felt to be contamination and antibiotics discontinued 04/28/23    Acute on chronic pancreatitis by CT scan: MRCP showed : Dilatation of the pancreatic duct measuring up to 0.7 cm, with abrupt caliber change in the pancreatic head neck junction. Diffusely atrophic pancreatic parenchyma distal to this point.Generally normal appearance of the pancreatic head. This appearancehas evolved over prior examinations dating back to 07/24/2019 andmost likely reflects chronic sequelae of pancreatitis, without discretely visible mass. 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 This requires no specific further follow-up given small size and advanced patient age. (89) No acute inflammatory findings in the abdomen at this time.  Discussed with GI and General surgery. It is felt that these changes are chronic. No need for intervention. They patient is urged to follow up with his GI in [facility] and he has an appointment with GI [facility] on June 20, 2023. That is the earliest appointment family could get. GI recommends to follow-up with [facility]. Lipid panel LDL 39 triglycerides 76 cholesterol 86.  No abdomen pain or tenderness having good bowel movement tolerating diet.  Leukocytosis: Without fever unclear etiology question reactive in the setting of pancreatitis versus other etiology ?Small left subscapularis hematoma related. Checked UA, CXR,-without evidence of infection, Procal  0.29, CRP some up 8.8.  Blood culture repeated 2/4 no growth to date.   Given gentle IV hydration with improving WBC count .of note patient has had chronic leukocytosis for past 3 to 4 years  He recently had CT scan on 1/22,1/27,1/28 and no indication for rechecking given improving counts and  benign abdominal exam. Wbc has normalized today Recent Labs  Lab 04/27/23 0539 04/28/23 0521 04/29/23 0545 04/29/23 1353 04/30/23 0537 05/01/23 0651  WBC 12.4* 17.3* 19.2*  --  15.2* 10.5  PROCALCITON  --   --   --  0.29  --   --     Essential hypertension: BP controlled.Continue Norvasc  hydralazine, metoprolol  losartan  and metoprolol .   Hypokalemia: Replaced.  PAF: Rate controlled on metoprolol .  INR therapeutic on Coumadin  pharmacy dosing as below m dose adjusted for coumadin  after discussing with pharmacy. Recent Labs  Lab 04/27/23 0539 04/28/23 0521 04/29/23 0545 04/30/23 0537 05/01/23 0651  INR 3.0* 2.8* 2.4* 2.5* 2.4*    Chronic anemia: hb trended down partly related to hemodilution, INR was 7.8 on 04/21/2023, INR stable no evidence of acute bleeding-FOBT unable to be done- but stool is brown color -asked RN to check FOBT today.  Hemoglobin down at 6.6 g on recheck patient agreeable for 1 unit PRBC transfusion and have ordered.Anemia panel showed iron deficiency Recent Labs    04/28/23 0521 04/29/23 0545 04/30/23 0537 04/30/23 0937 04/30/23 1711 05/01/23 0651  HGB 8.4* 7.5* 6.6* 6.6* 8.3* 7.9*  MCV 88.2 88.0 90.3  --   --  88.3  VITAMINB12 697  --   --   --   --   --   FOLATE 12.3  --   --   --   --   --   FERRITIN 378*  --   --   --   --   --  TIBC 151*  --   --   --   --   --   IRON 21*  --   --   --   --   --   RETICCTPCT 5.4*  --   --   --   --   --     Glaucoma: cont latanoprost  eyedrops   Consults: none Subjective: Alert awake oriented, no abdomen pain nausea vomiting dizziness.  He feels well and ready for discharge to home today  Discharge Exam: Vitals:   04/30/23 2101 05/01/23 0313  BP: 116/70 128/67  Pulse: 96 80  Resp: 18 18  Temp: 98.4 F (36.9 C) 98 F (36.7 C)  SpO2: 100% 100%   General: Pt is alert, awake, not in acute distress Cardiovascular: RRR, S1/S2 +, no rubs, no gallops Respiratory: CTA bilaterally, no wheezing, no  rhonchi Abdominal: Soft, NT, ND, bowel sounds + Extremities: no edema, no cyanosis  Discharge Instructions  Discharge Instructions     Diet - low sodium heart healthy   Complete by: As directed    Discharge instructions   Complete by: As directed    Please call call MD or return to ER for similar or worsening recurring problem that brought you to hospital or if any fever,nausea/vomiting,abdominal pain, uncontrolled pain, chest pain,  shortness of breath or any other alarming symptoms.  Check cbc, bmp in 5 days check INR to adjust your coumadin    Please follow-up your doctor as instructed in a week time and call the office for appointment.  Please avoid alcohol, smoking, or any other illicit substance and maintain healthy habits including taking your regular medications as prescribed.  You were cared for by a hospitalist during your hospital stay. If you have any questions about your discharge medications or the care you received while you were in the hospital after you are discharged, you can call the unit and ask to speak with the hospitalist on call if the hospitalist that took care of you is not available.  Once you are discharged, your primary care physician will handle any further medical issues. Please note that NO REFILLS for any discharge medications will be authorized once you are discharged, as it is imperative that you return to your primary care physician (or establish a relationship with a primary care physician if you do not have one) for your aftercare needs so that they can reassess your need for medications and monitor your lab values   Increase activity slowly   Complete by: As directed       Allergies as of 05/01/2023       Reactions   Lisinopril Cough        Medication List     STOP taking these medications    enoxaparin  80 MG/0.8ML injection Commonly known as: LOVENOX        TAKE these medications    albuterol 108 (90 Base) MCG/ACT inhaler Commonly  known as: VENTOLIN HFA Inhale 1-2 puffs into the lungs every 6 (six) hours as needed for wheezing or shortness of breath.   amLODipine  10 MG tablet Commonly known as: NORVASC  Take 10 mg by mouth daily.   Botox 100 units Solr injection Generic drug: botulinum toxin Type A Inject 100 Units into the muscle See admin instructions. Inject 100 units into the eye muscle every 4 months.   latanoprost  0.005 % ophthalmic solution Commonly known as: XALATAN  Place 1 drop into both eyes at bedtime.   losartan  25 MG tablet Commonly known as:  COZAAR  Take 25 mg by mouth daily.   metFORMIN  500 MG 24 hr tablet Commonly known as: GLUCOPHAGE -XR Take 500 mg by mouth in the morning and at bedtime.   metoprolol  tartrate 50 MG tablet Commonly known as: LOPRESSOR  Take 75 mg by mouth in the morning and at bedtime.   multivitamin with minerals Tabs tablet Take 1 tablet by mouth daily.   ondansetron  4 MG tablet Commonly known as: ZOFRAN  Take 1 tablet (4 mg total) by mouth every 4 (four) hours as needed for nausea or vomiting.   rosuvastatin  20 MG tablet Commonly known as: CRESTOR  Take 20 mg by mouth at bedtime.   thiamine  100 MG tablet Commonly known as: Vitamin B-1 Take 1 tablet (100 mg total) by mouth daily.   warfarin 5 MG tablet Commonly known as: COUMADIN  Take 1-1.5 tablets (5-7.5 mg total) by mouth See admin instructions. Take 7.5 mg (1.5 tablets) by mouth on Mon/Fri; take 5 mg (1 tablet) by mouth all other days (Sun, Tues, Wed, Thurs, Sat) What changed:  how much to take when to take this additional instructions        Follow-up Information     [facility], [facility] Follow up.   Specialty: Home Health Services Why: Hedda will follow up with you at discharge to provide home health services Contact information: [address] [place] [address] [phone]                Allergies  Allergen Reactions   Lisinopril Cough    The results of  significant diagnostics from this hospitalization (including imaging, microbiology, ancillary and laboratory) are listed below for reference.    Microbiology: Recent Results (from the past 240 hours)  Culture, blood (Routine X 2) w Reflex to ID Panel     Status: Abnormal   Collection Time: 04/22/23  9:53 PM   Specimen: BLOOD RIGHT FOREARM  Result Value Ref Range Status   Specimen Description   Final    BLOOD RIGHT FOREARM Performed at [facility], [address], [place], [place] [address]    Special Requests   Final    BOTTLES DRAWN AEROBIC AND ANAEROBIC Blood Culture results may not be optimal due to an inadequate volume of blood received in culture bottles Performed at [facility], [address] [place], [place] [address]    Culture  Setup Time   Final    GRAM POSITIVE COCCI IN CHAINS AEROBIC BOTTLE ONLY CRITICAL RESULT CALLED TO, READ BACK BY AND VERIFIED WITH: PHARMD N. GLOGOVAC [id number] @ 2120 FH  Performed at [facility], [address], [place], [place] [address]    Culture VIRIDANS STREPTOCOCCUS (A)  Final   Report Status 04/26/2023 FINAL  Final   Organism ID, Bacteria VIRIDANS STREPTOCOCCUS  Final      Susceptibility   Viridans streptococcus - MIC*    PENICILLIN <=0.06 SENSITIVE Sensitive     CEFTRIAXONE  <=0.12 SENSITIVE Sensitive     LEVOFLOXACIN 4 INTERMEDIATE Intermediate     VANCOMYCIN  <=0.12 SENSITIVE Sensitive     * VIRIDANS STREPTOCOCCUS  Blood Culture ID Panel (Reflexed)     Status: Abnormal   Collection Time: 04/22/23  9:53 PM  Result Value Ref Range Status   Enterococcus faecalis NOT DETECTED NOT DETECTED Final   Enterococcus Faecium NOT DETECTED NOT DETECTED Final   Listeria monocytogenes NOT DETECTED NOT DETECTED Final   Staphylococcus species NOT DETECTED NOT DETECTED Final   Staphylococcus aureus (BCID)  NOT DETECTED NOT DETECTED Final   Staphylococcus epidermidis NOT DETECTED NOT DETECTED Final   Staphylococcus  lugdunensis NOT DETECTED NOT DETECTED Final   Streptococcus species DETECTED (A) NOT DETECTED Final    Comment: Not Enterococcus species, Streptococcus agalactiae, Streptococcus pyogenes, or Streptococcus pneumoniae. CRITICAL RESULT CALLED TO, READ BACK BY AND VERIFIED WITH: MAYA GEANNIE ROLLER [id number] @ 2120 FH     Streptococcus agalactiae NOT DETECTED NOT DETECTED Final   Streptococcus pneumoniae NOT DETECTED NOT DETECTED Final   Streptococcus pyogenes NOT DETECTED NOT DETECTED Final   A.calcoaceticus-baumannii NOT DETECTED NOT DETECTED Final   Bacteroides fragilis NOT DETECTED NOT DETECTED Final   Enterobacterales NOT DETECTED NOT DETECTED Final   Enterobacter cloacae complex NOT DETECTED NOT DETECTED Final   Escherichia coli NOT DETECTED NOT DETECTED Final   Klebsiella aerogenes NOT DETECTED NOT DETECTED Final   Klebsiella oxytoca NOT DETECTED NOT DETECTED Final   Klebsiella pneumoniae NOT DETECTED NOT DETECTED Final   Proteus species NOT DETECTED NOT DETECTED Final   Salmonella species NOT DETECTED NOT DETECTED Final   Serratia marcescens NOT DETECTED NOT DETECTED Final   Haemophilus influenzae NOT DETECTED NOT DETECTED Final   Neisseria meningitidis NOT DETECTED NOT DETECTED Final   Pseudomonas aeruginosa NOT DETECTED NOT DETECTED Final   Stenotrophomonas maltophilia NOT DETECTED NOT DETECTED Final   Candida albicans NOT DETECTED NOT DETECTED Final   Candida auris NOT DETECTED NOT DETECTED Final   Candida glabrata NOT DETECTED NOT DETECTED Final   Candida krusei NOT DETECTED NOT DETECTED Final   Candida parapsilosis NOT DETECTED NOT DETECTED Final   Candida tropicalis NOT DETECTED NOT DETECTED Final   Cryptococcus neoformans/gattii NOT DETECTED NOT DETECTED Final    Comment: Performed at [facility], [address] [place], [place] [address]  Culture, blood (Routine X 2) w Reflex to ID Panel     Status: Abnormal   Collection Time: 04/22/23  9:54 PM   Specimen: BLOOD LEFT  FOREARM  Result Value Ref Range Status   Specimen Description   Final    BLOOD LEFT FOREARM Performed at [facility], [address] [place], [place] [address]    Special Requests   Final    BOTTLES DRAWN AEROBIC ONLY Blood Culture results may not be optimal due to an inadequate volume of blood received in culture bottles Performed at [facility], [address] [place], [place] [address]    Culture  Setup Time   Final    GRAM POSITIVE COCCI IN CLUSTERS AEROBIC BOTTLE ONLY CRITICAL RESULT CALLED TO, READ BACK BY AND VERIFIED WITH: PHARMD N. GLOGOVAC [id number] @ 2120 FH     Culture (A)  Final    STAPHYLOCOCCUS HAEMOLYTICUS THE SIGNIFICANCE OF ISOLATING THIS ORGANISM FROM A SINGLE SET OF BLOOD CULTURES WHEN MULTIPLE SETS ARE DRAWN IS UNCERTAIN. PLEASE NOTIFY THE MICROBIOLOGY DEPARTMENT WITHIN ONE WEEK IF SPECIATION AND SENSITIVITIES ARE REQUIRED. Performed at [facility], [address], [place], [place] [address]    Report Status 04/25/2023 FINAL  Final  Respiratory (~20 pathogens) panel by PCR     Status: None   Collection Time: 04/22/23 11:00 PM   Specimen: Nasopharyngeal Swab; Respiratory  Result Value Ref Range Status   Adenovirus NOT DETECTED NOT DETECTED Final   Coronavirus 229E NOT DETECTED NOT DETECTED Final    Comment: (NOTE) The Coronavirus on the Respiratory Panel, DOES NOT test for the novel  Coronavirus (2019 nCoV)    Coronavirus HKU1 NOT  DETECTED NOT DETECTED Final   Coronavirus NL63 NOT DETECTED NOT DETECTED Final   Coronavirus OC43 NOT DETECTED NOT DETECTED Final   Metapneumovirus NOT DETECTED NOT DETECTED Final   Rhinovirus / Enterovirus NOT DETECTED NOT DETECTED Final   Influenza A NOT DETECTED NOT DETECTED Final   Influenza B NOT DETECTED NOT DETECTED Final   Parainfluenza Virus 1 NOT DETECTED NOT DETECTED Final   Parainfluenza Virus 2 NOT DETECTED NOT DETECTED Final   Parainfluenza Virus 3 NOT DETECTED NOT DETECTED Final    Parainfluenza Virus 4 NOT DETECTED NOT DETECTED Final   Respiratory Syncytial Virus NOT DETECTED NOT DETECTED Final   Bordetella pertussis NOT DETECTED NOT DETECTED Final   Bordetella Parapertussis NOT DETECTED NOT DETECTED Final   Chlamydophila pneumoniae NOT DETECTED NOT DETECTED Final   Mycoplasma pneumoniae NOT DETECTED NOT DETECTED Final    Comment: Performed at [facility], [address], [place], [place] [address]  Blood Culture ID Panel (Reflexed)     Status: Abnormal   Collection Time: 04/23/23  9:46 AM  Result Value Ref Range Status   Enterococcus faecalis NOT DETECTED NOT DETECTED Final   Enterococcus Faecium NOT DETECTED NOT DETECTED Final   Listeria monocytogenes NOT DETECTED NOT DETECTED Final   Staphylococcus species DETECTED (A) NOT DETECTED Final    Comment: CRITICAL RESULT CALLED TO, READ BACK BY AND VERIFIED WITH: MAYA GEANNIE ROLLER [id number] @ 2120 FH     Staphylococcus aureus (BCID) NOT DETECTED NOT DETECTED Final   Staphylococcus epidermidis NOT DETECTED NOT DETECTED Final   Staphylococcus lugdunensis NOT DETECTED NOT DETECTED Final   Streptococcus species NOT DETECTED NOT DETECTED Final   Streptococcus agalactiae NOT DETECTED NOT DETECTED Final   Streptococcus pneumoniae NOT DETECTED NOT DETECTED Final   Streptococcus pyogenes NOT DETECTED NOT DETECTED Final   A.calcoaceticus-baumannii NOT DETECTED NOT DETECTED Final   Bacteroides fragilis NOT DETECTED NOT DETECTED Final   Enterobacterales NOT DETECTED NOT DETECTED Final   Enterobacter cloacae complex NOT DETECTED NOT DETECTED Final   Escherichia coli NOT DETECTED NOT DETECTED Final   Klebsiella aerogenes NOT DETECTED NOT DETECTED Final   Klebsiella oxytoca NOT DETECTED NOT DETECTED Final   Klebsiella pneumoniae NOT DETECTED NOT DETECTED Final   Proteus species NOT DETECTED NOT DETECTED Final   Salmonella species NOT DETECTED NOT DETECTED Final   Serratia marcescens NOT DETECTED NOT DETECTED Final    Haemophilus influenzae NOT DETECTED NOT DETECTED Final   Neisseria meningitidis NOT DETECTED NOT DETECTED Final   Pseudomonas aeruginosa NOT DETECTED NOT DETECTED Final   Stenotrophomonas maltophilia NOT DETECTED NOT DETECTED Final   Candida albicans NOT DETECTED NOT DETECTED Final   Candida auris NOT DETECTED NOT DETECTED Final   Candida glabrata NOT DETECTED NOT DETECTED Final   Candida krusei NOT DETECTED NOT DETECTED Final   Candida parapsilosis NOT DETECTED NOT DETECTED Final   Candida tropicalis NOT DETECTED NOT DETECTED Final   Cryptococcus neoformans/gattii NOT DETECTED NOT DETECTED Final    Comment: Performed at [facility], [address] [place], [place] [address]  Culture, blood (Routine X 2) w Reflex to ID Panel     Status: None   Collection Time: 04/25/23 12:47 PM   Specimen: BLOOD RIGHT ARM  Result Value Ref Range Status   Specimen Description   Final    BLOOD RIGHT ARM Performed at [facility], [address], [place], [place] [address]    Special Requests   Final    AEROBIC  BOTTLE ONLY Blood Culture adequate volume Performed at [facility], [address] [place], [place] [address]    Culture   Final    NO GROWTH 5 DAYS Performed at [facility], [address] [place], [place] [address]    Report Status 04/30/2023 FINAL  Final  Culture, blood (Routine X 2) w Reflex to ID Panel     Status: None   Collection Time: 04/25/23 12:47 PM   Specimen: BLOOD RIGHT HAND  Result Value Ref Range Status   Specimen Description   Final    BLOOD RIGHT HAND Performed at [facility], [address], [place], [place] [address]    Special Requests   Final    AEROBIC BOTTLE ONLY Blood Culture adequate volume Performed at [facility], [address] [place], [place] [address]    Culture   Final    NO GROWTH 5 DAYS Performed at [facility], [address] [place], [place]  [address]    Report Status 04/30/2023 FINAL  Final  Culture, blood (Routine X 2) w Reflex to ID Panel     Status: None (Preliminary result)   Collection Time: 04/29/23  3:16 PM   Specimen: BLOOD RIGHT ARM  Result Value Ref Range Status   Specimen Description   Final    BLOOD RIGHT ARM Performed at [facility], [address] [place], [place] [address]    Special Requests   Final    BOTTLES DRAWN AEROBIC AND ANAEROBIC Blood Culture results may not be optimal due to an inadequate volume of blood received in culture bottles Performed at [facility], [address] [place], [place] [address]    Culture   Final    NO GROWTH 2 DAYS Performed at [facility], [address], [place], [place] [address]    Report Status PENDING  Incomplete  Culture, blood (Routine X 2) w Reflex to ID Panel     Status: None (Preliminary result)   Collection Time: 04/29/23  3:25 PM   Specimen: BLOOD RIGHT HAND  Result Value Ref Range Status   Specimen Description   Final    BLOOD RIGHT HAND Performed at [facility], [address], [place], [place] [address]    Special Requests   Final    BOTTLES DRAWN AEROBIC AND ANAEROBIC Blood Culture results may not be optimal due to an inadequate volume of blood received in culture bottles Performed at [facility], [address], [place], [place] [address]    Culture   Final    NO GROWTH 2 DAYS Performed at [facility], [address], [place], [place] [address]    Report Status PENDING  Incomplete    Procedures/Studies: DG Chest Port 1 View Result Date: 04/29/2023 CLINICAL DATA:  Leukocytosis. EXAM: PORTABLE CHEST 1 VIEW COMPARISON:  Chest x-ray CT chest dated April 22, 2023. FINDINGS: The heart size and mediastinal contours are within normal limits. Both lungs are clear. The visualized skeletal structures are unremarkable. IMPRESSION: No active disease. Electronically Signed   By: Elsie ONEIDA Shoulder  M.D.   On: 04/29/2023 15:24   EEG adult Result Date: 04/24/2023 Shelton Arlin KIDD, MD     04/24/2023  4:36 PM Patient Name: Christian Crawford MRN: [id number] Epilepsy Attending: Arlin KIDD Shelton Referring Physician/Provider: Soledad Ligas, DO Date: 04/24/2023 Duration: 25.22 mins Patient history: 89yo M with syncope. EEG to evaluate for  seizure Level of alertness: Awake, asleep AEDs during EEG study: None Technical aspects: This EEG study was done with scalp electrodes positioned according to the 10-20 International system of electrode placement. Electrical activity was reviewed with band pass filter of 1-70Hz , sensitivity of 7 uV/mm, display speed of 73mm/sec with a 60Hz  notched filter applied as appropriate. EEG data were recorded continuously and digitally stored.  Video monitoring was available and reviewed as appropriate. Description: The posterior dominant rhythm consists of 8 Hz activity of moderate voltage (25-35 uV) seen predominantly in posterior head regions, symmetric and reactive to eye opening and eye closing. Sleep was characterized by vertex waves, maximal frontocentral region. Hyperventilation and photic stimulation were not performed.   IMPRESSION: This study is within normal limits. No seizures or epileptiform discharges were seen throughout the recording. A normal interictal EEG does not exclude the diagnosis of epilepsy. Arlin MALVA Krebs   ECHOCARDIOGRAM COMPLETE Result Date: 04/24/2023    ECHOCARDIOGRAM REPORT   Patient Name:   BRELAND TROUTEN Date of Exam: 04/24/2023 Medical Rec #:  [id number]      Height:       73.0 in Accession #:    [id number]     Weight:       174.2 lb Date of Birth:  October 01, 1933      BSA:          2.029 m Patient Age:    89 years       BP:           137/73 mmHg Patient Gender: M              HR:           85 bpm. Exam Location:  Inpatient Procedure: 2D Echo, Cardiac Doppler and Color Doppler Indications:    Syncope  History:        Patient has no prior history of Echocardiogram  examinations.                 Arrythmias:Atrial Fibrillation; Risk Factors:Former Smoker.  Sonographer:    Sabrina Gentry Referring Phys: [phone] AVA SWAYZE  Sonographer Comments: Image acquisition challenging due to respiratory motion. IMPRESSIONS  1. Left ventricular ejection fraction, by estimation, is 65 to 70%. The left ventricle has normal function. Left ventricular endocardial border not optimally defined to evaluate regional wall motion. Left ventricular diastolic parameters are consistent with Grade I diastolic dysfunction (impaired relaxation).  2. Right ventricular systolic function is normal. The right ventricular size is normal. Tricuspid regurgitation signal is inadequate for assessing PA pressure.  3. The mitral valve is degenerative. No evidence of mitral valve regurgitation. No evidence of mitral stenosis.  4. The aortic valve was not well visualized. Aortic valve regurgitation is not visualized.  5. The inferior vena cava is normal in size with greater than 50% respiratory variability, suggesting right atrial pressure of 3 mmHg. FINDINGS  Left Ventricle: Left ventricular ejection fraction, by estimation, is 65 to 70%. The left ventricle has normal function. Left ventricular endocardial border not optimally defined to evaluate regional wall motion. The left ventricular internal cavity size was normal in size. There is no left ventricular hypertrophy. Left ventricular diastolic parameters are consistent with Grade I diastolic dysfunction (impaired relaxation). Normal left ventricular filling pressure. Right Ventricle: The right ventricular size is normal. No increase in right ventricular wall thickness. Right ventricular systolic function is normal. Tricuspid regurgitation signal is inadequate for assessing PA pressure. Left Atrium: Left atrial size was normal in size. Right Atrium:  Right atrial size was normal in size. Pericardium: There is no evidence of pericardial effusion. Mitral Valve: The mitral  valve is degenerative in appearance. There is mild calcification of the anterior mitral valve leaflet(s). No evidence of mitral valve regurgitation. No evidence of mitral valve stenosis. MV peak gradient, 3.0 mmHg. The mean mitral valve gradient is 2.0 mmHg. Tricuspid Valve: The tricuspid valve is normal in structure. Tricuspid valve regurgitation is not demonstrated. No evidence of tricuspid stenosis. Aortic Valve: The aortic valve was not well visualized. Aortic valve regurgitation is not visualized. Aortic valve mean gradient measures 3.0 mmHg. Aortic valve peak gradient measures 6.0 mmHg. Aortic valve area, by VTI measures 1.46 cm. Pulmonic Valve: The pulmonic valve was normal in structure. Pulmonic valve regurgitation is not visualized. No evidence of pulmonic stenosis. Aorta: The aortic root is normal in size and structure. Venous: The inferior vena cava is normal in size with greater than 50% respiratory variability, suggesting right atrial pressure of 3 mmHg. IAS/Shunts: No atrial level shunt detected by color flow Doppler.  LEFT VENTRICLE PLAX 2D LVIDd:         3.80 cm      Diastology LVIDs:         2.60 cm      LV e' medial:    5.87 cm/s LV PW:         0.90 cm      LV E/e' medial:  10.7 LV IVS:        1.00 cm      LV e' lateral:   7.18 cm/s LVOT diam:     1.60 cm      LV E/e' lateral: 8.8 LV SV:         28 LV SV Index:   14 LVOT Area:     2.01 cm  LV Volumes (MOD) LV vol d, MOD A2C: 103.0 ml LV vol d, MOD A4C: 98.2 ml LV vol s, MOD A2C: 38.3 ml LV vol s, MOD A4C: 40.6 ml LV SV MOD A2C:     64.7 ml LV SV MOD A4C:     98.2 ml LV SV MOD BP:      62.0 ml RIGHT VENTRICLE             IVC RV Basal diam:  3.60 cm     IVC diam: 1.40 cm RV Mid diam:    2.50 cm RV S prime:     20.20 cm/s TAPSE (M-mode): 2.2 cm LEFT ATRIUM           Index       RIGHT ATRIUM           Index LA diam:      3.40 cm 1.68 cm/m  RA Area:     10.20 cm LA Vol (A2C): 17.0 ml 8.38 ml/m  RA Volume:   20.90 ml  10.30 ml/m LA Vol (A4C): 14.2  ml 7.00 ml/m  AORTIC VALVE                    PULMONIC VALVE AV Area (Vmax):    1.75 cm     PV Vmax:       0.91 m/s AV Area (Vmean):   1.03 cm     PV Peak grad:  3.3 mmHg AV Area (VTI):     1.46 cm AV Vmax:           122.00 cm/s AV Vmean:          83.800  cm/s AV VTI:            0.189 m AV Peak Grad:      6.0 mmHg AV Mean Grad:      3.0 mmHg LVOT Vmax:         106.00 cm/s LVOT Vmean:        42.900 cm/s LVOT VTI:          0.137 m LVOT/AV VTI ratio: 0.72  AORTA Ao Root diam: 3.50 cm MITRAL VALVE MV Area (PHT): 3.39 cm    SHUNTS MV Area VTI:   1.87 cm    Systemic VTI:  0.14 m MV Peak grad:  3.0 mmHg    Systemic Diam: 1.60 cm MV Mean grad:  2.0 mmHg MV Vmax:       0.87 m/s MV Vmean:      65.4 cm/s MV Decel Time: 224 msec MV E velocity: 62.90 cm/s MV A velocity: 82.50 cm/s MV E/A ratio:  0.76 Wilbert Bihari MD Electronically signed by Wilbert Bihari MD Signature Date/Time: 04/24/2023/10:19:14 AM    Final    US  Abdomen Limited RUQ (LIVER/GB) Result Date: 04/22/2023 CLINICAL DATA:  Pancreatitis, abnormal gallbladder imaging EXAM: ULTRASOUND ABDOMEN LIMITED RIGHT UPPER QUADRANT COMPARISON:  Same day CT abdomen and pelvis FINDINGS: Gallbladder: No gallstones or wall thickening visualized. No sonographic Murphy sign noted by sonographer. Common bile duct: Diameter: 7 mm.  No intrahepatic biliary dilation. Liver: No focal lesion identified. Increased parenchymal echogenicity. Portal vein is patent on color Doppler imaging with normal direction of blood flow towards the liver. Other: None. IMPRESSION: No evidence of acute cholecystitis. Electronically Signed   By: Norman Gatlin M.D.   On: 04/22/2023 23:46   MR BRAIN WO CONTRAST Result Date: 04/22/2023 CLINICAL DATA:  Mental status change, unknown cause EXAM: MRI HEAD WITHOUT CONTRAST TECHNIQUE: Multiplanar, multiecho pulse sequences of the brain and surrounding structures were obtained without intravenous contrast. COMPARISON:  CT head from today. FINDINGS: Brain: No  acute infarction, hemorrhage, hydrocephalus, extra-axial collection or mass lesion. Patchy T2/FLAIR hyperintensities in the white matter are nonspecific but compatible with chronic microvascular ischemic change. Cerebral atrophy. Vascular: Major arterial flow voids are maintained at the skull base. Skull and upper cervical spine: Normal marrow signal. Sinuses/Orbits: Clear sinuses.  No acute orbital findings. Other: No mastoid effusions. IMPRESSION: No evidence of acute intracranial abnormality. Electronically Signed   By: Gilmore GORMAN Molt M.D.   On: 04/22/2023 22:17   CT Angio Chest/Abd/Pel for Dissection W and/or W/WO Result Date: 04/22/2023 CLINICAL DATA:  Recent syncopal episode EXAM: CT ANGIOGRAPHY CHEST, ABDOMEN AND PELVIS TECHNIQUE: Non-contrast CT of the chest was initially obtained. Multidetector CT imaging through the chest, abdomen and pelvis was performed using the standard protocol during bolus administration of intravenous contrast. Multiplanar reconstructed images and MIPs were obtained and reviewed to evaluate the vascular anatomy. RADIATION DOSE REDUCTION: This exam was performed according to the departmental dose-optimization program which includes automated exposure control, adjustment of the mA and/or kV according to patient size and/or use of iterative reconstruction technique. CONTRAST:  OMNIPAQUE  IOHEXOL  350 MG/ML SOLN COMPARISON:  04/21/2023 FINDINGS: CTA CHEST FINDINGS Cardiovascular: Initial precontrast images demonstrate heavy atherosclerotic calcifications of the aorta. No aneurysmal dilatation is noted. Coronary calcifications are seen. Post-contrast images demonstrate no evidence of aortic dissection or aneurysmal dilatation. Pulmonary artery as visualized is within normal limits without evidence of pulmonary embolism. No cardiac enlargement is seen. Stable small pericardial effusion is noted. Mediastinum/Nodes: Thoracic inlet is within normal limits. No hilar  or mediastinal  adenopathy is noted. The esophagus as visualized is within normal limits. Lungs/Pleura: Lungs are well aerated bilaterally. Mild left basilar atelectasis is noted. No sizable effusion is seen. Stable 3 mm nodule is noted in the right upper lobe. Musculoskeletal: No acute rib abnormality is noted. Degenerative changes of the thoracic spine are seen. Review of the MIP images confirms the above findings. CTA ABDOMEN AND PELVIS FINDINGS VASCULAR Aorta: Atherosclerotic calcifications are noted without aneurysmal dilatation or dissection. Celiac: Patent without evidence of aneurysm, dissection, vasculitis or significant stenosis. SMA: Patent without evidence of aneurysm, dissection, vasculitis or significant stenosis. Renals: Both renal arteries are patent without evidence of aneurysm, dissection, vasculitis, fibromuscular dysplasia or significant stenosis. IMA: Patent without evidence of aneurysm, dissection, vasculitis or significant stenosis. Inflow: Iliacs demonstrate diffuse atherosclerotic calcification. Focal minimal dissection is noted in the right common iliac artery. No flow limitation is noted. No other arterial abnormality is seen. Veins: No obvious venous abnormality within the limitations of this arterial phase study. Review of the MIP images confirms the above findings. NON-VASCULAR Hepatobiliary: No focal liver abnormality is seen. No gallstones, gallbladder wall thickening, or biliary dilatation. Pancreas: Pancreas again demonstrates inflammatory change consistent with pancreatitis. Dilatation of the pancreatic duct is noted. The overall appearance is stable from the previous exam. Spleen: No splenic injury or perisplenic hematoma. Adrenals/Urinary Tract: Adrenal glands are within normal limits. Kidneys demonstrate a normal enhancement pattern bilaterally. No obstructive changes are seen. The bladder is partially distended with opacified urine. Stomach/Bowel: Scattered diverticular change of the colon is  noted. No diverticulitis is seen. The appendix is well visualized and within normal limits. Small bowel and stomach are within normal limits. Lymphatic: No sizable adenopathy is seen. Reproductive: Prostate is unremarkable. Other: No abdominal wall hernia or abnormality. No abdominopelvic ascites. Musculoskeletal: No acute or significant osseous findings. Review of the MIP images confirms the above findings. IMPRESSION: CTA of the chest: No evidence of pulmonary emboli. No evidence of aortic dissection. Stable 3 mm nodule in the right upper lobe. No follow-up is recommended. CTA of the abdomen and pelvis: No acute arterial abnormality is seen. Stable changes of acute pancreatitis. 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 Diverticulosis without diverticulitis. Electronically Signed   By: Oneil Devonshire M.D.   On: 04/22/2023 21:00   DG Chest Portable 1 View Result Date: 04/22/2023 CLINICAL DATA:  Syncope. EXAM: PORTABLE CHEST 1 VIEW COMPARISON:  Radiograph 04/18/2023 FINDINGS: Stable cardiomediastinal silhouette. Aortic atherosclerotic calcification. No focal consolidation, pleural effusion, or pneumothorax. No displaced rib fractures. IMPRESSION: No acute cardiopulmonary disease. Electronically Signed   By: Norman Gatlin M.D.   On: 04/22/2023 17:15   CT Head Wo Contrast Result Date: 04/22/2023 CLINICAL DATA:  Mental status change of unknown cause. Syncopal episode with trauma to the head. EXAM: CT HEAD WITHOUT CONTRAST TECHNIQUE: Contiguous axial images were obtained from the base of the skull through the vertex without intravenous contrast. RADIATION DOSE REDUCTION: This exam was performed according to the departmental dose-optimization program which includes automated exposure control, adjustment of the mA and/or kV according to patient size and/or use of iterative reconstruction technique. COMPARISON:  05/09/2019 FINDINGS: Brain: Age related volume loss. Chronic small-vessel  ischemic changes of the white matter. No sign of acute infarction, mass lesion, hemorrhage, hydrocephalus or extra-axial collection. Vascular: There is atherosclerotic calcification of the major vessels at the base of the brain. Skull: No skull fracture. Sinuses/Orbits: Clear/normal Other: None IMPRESSION: No acute CT finding. Age related volume  loss. Chronic small-vessel ischemic changes of the white matter. Electronically Signed   By: Oneil Officer M.D.   On: 04/22/2023 16:34   CT CHEST ABDOMEN PELVIS W CONTRAST Result Date: 04/21/2023 CLINICAL DATA:  Left chest wall bruise. Rule out bleeding or traumatic injury to the chest. Shortness of breath. EXAM: CT CHEST, ABDOMEN, AND PELVIS WITH CONTRAST TECHNIQUE: Multidetector CT imaging of the chest, abdomen and pelvis was performed following the standard protocol during bolus administration of intravenous contrast. RADIATION DOSE REDUCTION: This exam was performed according to the departmental dose-optimization program which includes automated exposure control, adjustment of the mA and/or kV according to patient size and/or use of iterative reconstruction technique. CONTRAST:  OMNIPAQUE  IOHEXOL  300 MG/ML  SOLN COMPARISON:  CT of the abdomen pelvis dated 04/16/2023. FINDINGS: CT CHEST FINDINGS Cardiovascular: There is no cardiomegaly. Small pericardial effusion measuring up to 8 mm in thickness anterior to the heart. There is 3 vessel coronary vascular calcification. Moderate atherosclerotic calcification of the thoracic aorta. No aneurysmal dilatation or dissection. No periaortic fluid collection. The central pulmonary arteries appear patent. Mediastinum/Nodes: No hilar or mediastinal adenopathy. The esophagus is grossly unremarkable. No mediastinal fluid collection. Lungs/Pleura: Linear atelectasis/scarring in the lingula. A 3 mm right apical nodule (29/6). No focal consolidation, pleural effusion, or pneumothorax. The central airways are patent.  Musculoskeletal: Degenerative changes of the spine and osteopenia. No acute osseous pathology. There is stranding of the fat plane of the left axilla and shoulder area. No large fluid collection or hematoma. Mild asymmetric enlargement of the left shoulder musculature involving the medial aspect of the subscapularis suspicious for a small intramuscular hematoma. CT ABDOMEN PELVIS FINDINGS No intra-abdominal free air or free fluid. Hepatobiliary: The liver is unremarkable. No biliary dilatation. The gallbladder is unremarkable. Pancreas: Inflammatory changes of the pancreas in keeping with no pancreatitis. There is dilatation of the main pancreatic duct as on the prior CT. No drainable fluid collection/abscess or pseudocyst. Spleen: Normal in size without focal abnormality.  The uterus Adrenals/Urinary Tract: Glands are unremarkable. There is no hydronephrosis on either side. There is symmetric enhancement and excretion of contrast by both kidneys. The visualized ureters and urinary bladder appear unremarkable. Stomach/Bowel: There is sigmoid diverticulosis without active inflammatory changes. There is no bowel obstruction or active inflammation. The appendix is normal. Vascular/Lymphatic: Moderate aortoiliac atherosclerotic disease. The IVC is unremarkable. No portal venous gas. There is no adenopathy. Reproductive: Mildly enlarged prostate gland measuring 6 cm in transverse axial diameter. The seminal vesicles are symmetric. Other: None Musculoskeletal: Degenerative changes of the hips bilaterally, left greater than right. Osteopenia. Similar appearance of a sclerotic area in the superior aspect of L3, likely a Schmorl's node. No acute osseous pathology. IMPRESSION: 1. Probable small intramuscular hematoma involving the medial left subscapularis muscle. No fluid collection or large hematoma. 2. Acute pancreatitis. No drainable fluid collection/abscess or pseudocyst. 3. Sigmoid diverticulosis. No bowel obstruction.  Normal appendix. 4.  Aortic Atherosclerosis (ICD10-I70.0). Electronically Signed   By: Vanetta Chou M.D.   On: 04/21/2023 12:39   DG Chest 2 View Result Date: 04/18/2023 CLINICAL DATA:  Shortness of breath. EXAM: CHEST - 2 VIEW COMPARISON:  11/21/2021. FINDINGS: Bilateral lung fields are clear. Bilateral costophrenic angles are clear. Normal cardio-mediastinal silhouette. No acute osseous abnormalities. The soft tissues are within normal limits. IMPRESSION: No active cardiopulmonary disease. Electronically Signed   By: Ree Molt M.D.   On: 04/18/2023 16:20   CT ABDOMEN PELVIS W CONTRAST Result Date: 04/17/2023 CLINICAL DATA:  Epigastric  abdominal pain. Nausea and vomiting. Acute pancreatitis. EXAM: CT ABDOMEN AND PELVIS WITH CONTRAST TECHNIQUE: Multidetector CT imaging of the abdomen and pelvis was performed using the standard protocol following bolus administration of intravenous contrast. RADIATION DOSE REDUCTION: This exam was performed according to the departmental dose-optimization program which includes automated exposure control, adjustment of the mA and/or kV according to patient size and/or use of iterative reconstruction technique. CONTRAST:  OMNIPAQUE  IOHEXOL  300 MG/ML  SOLN COMPARISON:  01/21/2023 FINDINGS: Lower Chest: No acute findings. Hepatobiliary: No suspicious hepatic masses identified. Gallbladder is unremarkable. No evidence of biliary ductal dilatation. Pancreas: Moderate acute pancreatitis is seen. A small low-attenuation focus in the tail measuring 1.4 cm may represent pancreatic necrosis or pseudocyst. Spleen: Within normal limits in size and appearance. Adrenals/Urinary Tract: No suspicious masses identified. No evidence of ureteral calculi or hydronephrosis. Stomach/Bowel: No evidence of obstruction, inflammatory process or abnormal fluid collections. Normal appendix visualized. Diverticulosis is seen mainly involving the sigmoid colon, however there is no evidence of  diverticulitis. Vascular/Lymphatic: No pathologically enlarged lymph nodes. No acute vascular findings. Reproductive:  Mildly enlarged prostate. Other:  None. Musculoskeletal:  No suspicious bone lesions identified. IMPRESSION: Moderate acute pancreatitis. 1.4 cm low-attenuation focus in pancreatic tail, which may represent pancreatic necrosis or small pseudocyst. Colonic diverticulosis, without radiographic evidence of diverticulitis. Mildly enlarged prostate. Electronically Signed   By: Norleen DELENA Kil M.D.   On: 04/17/2023 14:59    Labs: BNP (last 3 results) No results for input(s): BNP in the last 8760 hours. Basic Metabolic Panel: Recent Labs  Lab 04/27/23 0539 04/28/23 0521 04/29/23 0545 04/30/23 0537 05/01/23 0651  NA 136 137 136 136 135  K 3.8 3.9 3.4* 3.3* 3.9  CL 108 109 106 107 104  CO2 21* 21* 21* 22 23  GLUCOSE 102* 106* 124* 116* 105*  BUN 8 8 12 9  7*  CREATININE 0.63 0.67 0.63 0.41* 0.59*  CALCIUM  8.2* 8.4* 8.2* 8.1* 8.0*   Liver Function Tests: Recent Labs  Lab 04/28/23 0521  AST 18  ALT 10  ALKPHOS 67  BILITOT 1.2  PROT 6.0*  ALBUMIN 2.8*   No results for input(s): LIPASE, AMYLASE in the last 168 hours. No results for input(s): AMMONIA in the last 168 hours. CBC: Recent Labs  Lab 04/25/23 0920 04/26/23 0700 04/27/23 0539 04/28/23 0521 04/29/23 0545 04/30/23 0537 04/30/23 0937 04/30/23 1711 05/01/23 0651  WBC 14.1* 11.0* 12.4* 17.3* 19.2* 15.2*  --   --  10.5  NEUTROABS 9.2* 6.5 8.1*  --   --   --   --   --   --   HGB 7.9* 7.6* 7.9* 8.4* 7.5* 6.6* 6.6* 8.3* 7.9*  HCT 25.3* 24.1* 24.9* 26.8* 24.3* 21.3* 21.2* 25.9* 25.0*  MCV 88.8 88.3 88.6 88.2 88.0 90.3  --   --  88.3  PLT 602* 556* 559* 604* 557* 460*  --   --  436*   Cardiac Enzymes: No results for input(s): CKTOTAL, CKMB, CKMBINDEX, TROPONINI in the last 168 hours. BNP: Invalid input(s): POCBNP CBG: Recent Labs  Lab 04/30/23 1153 04/30/23 1648 04/30/23 2103  05/01/23 0738 05/01/23 1154  GLUCAP 163* 128* 135* 108* 165*   D-Dimer No results for input(s): DDIMER in the last 72 hours. Hgb A1c No results for input(s): HGBA1C in the last 72 hours. Lipid Profile No results for input(s): CHOL, HDL, LDLCALC, TRIG, CHOLHDL, LDLDIRECT in the last 72 hours. Thyroid function studies No results for input(s): TSH, T4TOTAL, T3FREE, THYROIDAB in the last 72  hours.  Invalid input(s): FREET3 Anemia work up No results for input(s): VITAMINB12, FOLATE, FERRITIN, TIBC, IRON, RETICCTPCT in the last 72 hours. Urinalysis    Component Value Date/Time   COLORURINE YELLOW 04/29/2023 1823   APPEARANCEUR HAZY (A) 04/29/2023 1823   LABSPEC 1.019 04/29/2023 1823   PHURINE 5.0 04/29/2023 1823   GLUCOSEU NEGATIVE 04/29/2023 1823   HGBUR NEGATIVE 04/29/2023 1823   BILIRUBINUR NEGATIVE 04/29/2023 1823   KETONESUR NEGATIVE 04/29/2023 1823   PROTEINUR 30 (A) 04/29/2023 1823   NITRITE NEGATIVE 04/29/2023 1823   LEUKOCYTESUR NEGATIVE 04/29/2023 1823   Sepsis Labs Recent Labs  Lab 04/28/23 0521 04/29/23 0545 04/30/23 0537 05/01/23 0651  WBC 17.3* 19.2* 15.2* 10.5   Microbiology Recent Results (from the past 240 hours)  Culture, blood (Routine X 2) w Reflex to ID Panel     Status: Abnormal   Collection Time: 04/22/23  9:53 PM   Specimen: BLOOD RIGHT FOREARM  Result Value Ref Range Status   Specimen Description   Final    BLOOD RIGHT FOREARM Performed at [facility], [address], [place], [place] [address]    Special Requests   Final    BOTTLES DRAWN AEROBIC AND ANAEROBIC Blood Culture results may not be optimal due to an inadequate volume of blood received in culture bottles Performed at [facility], [address] [place], [place] [address]    Culture  Setup Time   Final    GRAM POSITIVE COCCI IN CHAINS AEROBIC BOTTLE ONLY CRITICAL RESULT CALLED TO, READ BACK BY AND VERIFIED WITH:  PHARMD N. GLOGOVAC [id number] @ 2120 FH  Performed at [facility], [address], [place], [place] [address]    Culture VIRIDANS STREPTOCOCCUS (A)  Final   Report Status 04/26/2023 FINAL  Final   Organism ID, Bacteria VIRIDANS STREPTOCOCCUS  Final      Susceptibility   Viridans streptococcus - MIC*    PENICILLIN <=0.06 SENSITIVE Sensitive     CEFTRIAXONE  <=0.12 SENSITIVE Sensitive     LEVOFLOXACIN 4 INTERMEDIATE Intermediate     VANCOMYCIN  <=0.12 SENSITIVE Sensitive     * VIRIDANS STREPTOCOCCUS  Blood Culture ID Panel (Reflexed)     Status: Abnormal   Collection Time: 04/22/23  9:53 PM  Result Value Ref Range Status   Enterococcus faecalis NOT DETECTED NOT DETECTED Final   Enterococcus Faecium NOT DETECTED NOT DETECTED Final   Listeria monocytogenes NOT DETECTED NOT DETECTED Final   Staphylococcus species NOT DETECTED NOT DETECTED Final   Staphylococcus aureus (BCID) NOT DETECTED NOT DETECTED Final   Staphylococcus epidermidis NOT DETECTED NOT DETECTED Final   Staphylococcus lugdunensis NOT DETECTED NOT DETECTED Final   Streptococcus species DETECTED (A) NOT DETECTED Final    Comment: Not Enterococcus species, Streptococcus agalactiae, Streptococcus pyogenes, or Streptococcus pneumoniae. CRITICAL RESULT CALLED TO, READ BACK BY AND VERIFIED WITH: PHARMD N. GLOGOVAC [id number] @ 2120 FH     Streptococcus agalactiae NOT DETECTED NOT DETECTED Final   Streptococcus pneumoniae NOT DETECTED NOT DETECTED Final   Streptococcus pyogenes NOT DETECTED NOT DETECTED Final   A.calcoaceticus-baumannii NOT DETECTED NOT DETECTED Final   Bacteroides fragilis NOT DETECTED NOT DETECTED Final   Enterobacterales NOT DETECTED NOT DETECTED Final   Enterobacter cloacae complex NOT DETECTED NOT DETECTED Final   Escherichia coli NOT DETECTED NOT DETECTED Final   Klebsiella aerogenes NOT DETECTED NOT DETECTED Final   Klebsiella oxytoca NOT DETECTED NOT DETECTED Final   Klebsiella pneumoniae NOT DETECTED NOT  DETECTED Final  Proteus species NOT DETECTED NOT DETECTED Final   Salmonella species NOT DETECTED NOT DETECTED Final   Serratia marcescens NOT DETECTED NOT DETECTED Final   Haemophilus influenzae NOT DETECTED NOT DETECTED Final   Neisseria meningitidis NOT DETECTED NOT DETECTED Final   Pseudomonas aeruginosa NOT DETECTED NOT DETECTED Final   Stenotrophomonas maltophilia NOT DETECTED NOT DETECTED Final   Candida albicans NOT DETECTED NOT DETECTED Final   Candida auris NOT DETECTED NOT DETECTED Final   Candida glabrata NOT DETECTED NOT DETECTED Final   Candida krusei NOT DETECTED NOT DETECTED Final   Candida parapsilosis NOT DETECTED NOT DETECTED Final   Candida tropicalis NOT DETECTED NOT DETECTED Final   Cryptococcus neoformans/gattii NOT DETECTED NOT DETECTED Final    Comment: Performed at [facility], [address] [place], [place] [address]  Culture, blood (Routine X 2) w Reflex to ID Panel     Status: Abnormal   Collection Time: 04/22/23  9:54 PM   Specimen: BLOOD LEFT FOREARM  Result Value Ref Range Status   Specimen Description   Final    BLOOD LEFT FOREARM Performed at [facility], [address] [place], [place] [address]    Special Requests   Final    BOTTLES DRAWN AEROBIC ONLY Blood Culture results may not be optimal due to an inadequate volume of blood received in culture bottles Performed at [facility], [address] [place], [place] [address]    Culture  Setup Time   Final    GRAM POSITIVE COCCI IN CLUSTERS AEROBIC BOTTLE ONLY CRITICAL RESULT CALLED TO, READ BACK BY AND VERIFIED WITH: PHARMD N. GLOGOVAC [id number] @ 2120 FH     Culture (A)  Final    STAPHYLOCOCCUS HAEMOLYTICUS THE SIGNIFICANCE OF ISOLATING THIS ORGANISM FROM A SINGLE SET OF BLOOD CULTURES WHEN MULTIPLE SETS ARE DRAWN IS UNCERTAIN. PLEASE NOTIFY THE MICROBIOLOGY DEPARTMENT WITHIN ONE WEEK IF SPECIATION AND SENSITIVITIES ARE REQUIRED. Performed at [facility], [address] [place], [place] [address]    Report Status 04/25/2023 FINAL  Final  Respiratory (~20 pathogens) panel by PCR     Status: None   Collection Time: 04/22/23 11:00 PM   Specimen: Nasopharyngeal Swab; Respiratory  Result Value Ref Range Status   Adenovirus NOT DETECTED NOT DETECTED Final   Coronavirus 229E NOT DETECTED NOT DETECTED Final    Comment: (NOTE) The Coronavirus on the Respiratory Panel, DOES NOT test for the novel  Coronavirus (2019 nCoV)    Coronavirus HKU1 NOT DETECTED NOT DETECTED Final   Coronavirus NL63 NOT DETECTED NOT DETECTED Final   Coronavirus OC43 NOT DETECTED NOT DETECTED Final   Metapneumovirus NOT DETECTED NOT DETECTED Final   Rhinovirus / Enterovirus NOT DETECTED NOT DETECTED Final   Influenza A NOT DETECTED NOT DETECTED Final   Influenza B NOT DETECTED NOT DETECTED Final   Parainfluenza Virus 1 NOT DETECTED NOT DETECTED Final   Parainfluenza Virus 2 NOT DETECTED NOT DETECTED Final   Parainfluenza Virus 3 NOT DETECTED NOT DETECTED Final   Parainfluenza Virus 4 NOT DETECTED NOT DETECTED Final   Respiratory Syncytial Virus NOT DETECTED NOT DETECTED Final   Bordetella pertussis NOT DETECTED NOT DETECTED Final   Bordetella Parapertussis NOT DETECTED NOT DETECTED Final   Chlamydophila pneumoniae NOT DETECTED NOT DETECTED Final   Mycoplasma pneumoniae NOT DETECTED NOT DETECTED Final    Comment: Performed at [facility], [address], [place], [place] [address]  Blood Culture ID Panel (  Reflexed)     Status: Abnormal   Collection Time: 04/23/23  9:46 AM  Result Value Ref Range Status   Enterococcus faecalis NOT DETECTED NOT DETECTED Final   Enterococcus Faecium NOT DETECTED NOT DETECTED Final   Listeria monocytogenes NOT DETECTED NOT DETECTED Final   Staphylococcus species DETECTED (A) NOT DETECTED Final    Comment: CRITICAL RESULT CALLED TO, READ BACK BY AND VERIFIED WITH: MAYA GEANNIE ROLLER [id number] @ 2120 FH     Staphylococcus aureus  (BCID) NOT DETECTED NOT DETECTED Final   Staphylococcus epidermidis NOT DETECTED NOT DETECTED Final   Staphylococcus lugdunensis NOT DETECTED NOT DETECTED Final   Streptococcus species NOT DETECTED NOT DETECTED Final   Streptococcus agalactiae NOT DETECTED NOT DETECTED Final   Streptococcus pneumoniae NOT DETECTED NOT DETECTED Final   Streptococcus pyogenes NOT DETECTED NOT DETECTED Final   A.calcoaceticus-baumannii NOT DETECTED NOT DETECTED Final   Bacteroides fragilis NOT DETECTED NOT DETECTED Final   Enterobacterales NOT DETECTED NOT DETECTED Final   Enterobacter cloacae complex NOT DETECTED NOT DETECTED Final   Escherichia coli NOT DETECTED NOT DETECTED Final   Klebsiella aerogenes NOT DETECTED NOT DETECTED Final   Klebsiella oxytoca NOT DETECTED NOT DETECTED Final   Klebsiella pneumoniae NOT DETECTED NOT DETECTED Final   Proteus species NOT DETECTED NOT DETECTED Final   Salmonella species NOT DETECTED NOT DETECTED Final   Serratia marcescens NOT DETECTED NOT DETECTED Final   Haemophilus influenzae NOT DETECTED NOT DETECTED Final   Neisseria meningitidis NOT DETECTED NOT DETECTED Final   Pseudomonas aeruginosa NOT DETECTED NOT DETECTED Final   Stenotrophomonas maltophilia NOT DETECTED NOT DETECTED Final   Candida albicans NOT DETECTED NOT DETECTED Final   Candida auris NOT DETECTED NOT DETECTED Final   Candida glabrata NOT DETECTED NOT DETECTED Final   Candida krusei NOT DETECTED NOT DETECTED Final   Candida parapsilosis NOT DETECTED NOT DETECTED Final   Candida tropicalis NOT DETECTED NOT DETECTED Final   Cryptococcus neoformans/gattii NOT DETECTED NOT DETECTED Final    Comment: Performed at [facility], [address] [place], [place] [address]  Culture, blood (Routine X 2) w Reflex to ID Panel     Status: None   Collection Time: 04/25/23 12:47 PM   Specimen: BLOOD RIGHT ARM  Result Value Ref Range Status   Specimen Description   Final    BLOOD RIGHT ARM Performed at  [facility], [address], [place], [place] [address]    Special Requests   Final    AEROBIC BOTTLE ONLY Blood Culture adequate volume Performed at [facility], [address], [place], [place] [address]    Culture   Final    NO GROWTH 5 DAYS Performed at [facility], [address] [place], [place] [address]    Report Status 04/30/2023 FINAL  Final  Culture, blood (Routine X 2) w Reflex to ID Panel     Status: None   Collection Time: 04/25/23 12:47 PM   Specimen: BLOOD RIGHT HAND  Result Value Ref Range Status   Specimen Description   Final    BLOOD RIGHT HAND Performed at [facility], [address] [place], [place] [address]    Special Requests   Final    AEROBIC BOTTLE ONLY Blood Culture adequate volume Performed at [facility], [address], [place], [place] [address]    Culture   Final    NO GROWTH 5 DAYS Performed at [facility], [address], [place], [place] [address]    Report Status 04/30/2023 FINAL  Final  Culture, blood (Routine X 2) w Reflex to ID Panel     Status: None (Preliminary result)   Collection Time: 04/29/23  3:16 PM   Specimen: BLOOD RIGHT ARM  Result Value Ref Range Status   Specimen Description   Final    BLOOD RIGHT ARM Performed at [facility], [address] [place], [place] [address]    Special Requests   Final    BOTTLES DRAWN AEROBIC AND ANAEROBIC Blood Culture results may not be optimal due to an inadequate volume of blood received in culture bottles Performed at [facility], [address], [place], [place] [address]    Culture   Final    NO GROWTH 2 DAYS Performed at [facility], [address] [place], [place] [address]    Report Status PENDING  Incomplete  Culture, blood (Routine X 2) w Reflex to ID Panel     Status: None (Preliminary result)   Collection Time: 04/29/23  3:25 PM   Specimen: BLOOD  RIGHT HAND  Result Value Ref Range Status   Specimen Description   Final    BLOOD RIGHT HAND Performed at [facility], [address] [place], [place] [address]    Special Requests   Final    BOTTLES DRAWN AEROBIC AND ANAEROBIC Blood Culture results may not be optimal due to an inadequate volume of blood received in culture bottles Performed at [facility], [address], [place], [place] [address]    Culture   Final    NO GROWTH 2 DAYS Performed at [facility], [address] [place], [place] [address]    Report Status PENDING  Incomplete   Time coordinating discharge: 35 minutes  SIGNED: Mennie LAMY, MD  [facility] 05/01/2023, 12:17 PM  If 7PM-7AM, please contact night-coverage [email or web address]

## 2023-05-01 NOTE — Progress Notes (Signed)
 Pt discharged home with 2 daughters/RN walked pt to main entrance and helped pt into car safely. Discharge paperwork looked at with pt and both daughters. [name] S Christian Crawford

## 2023-05-01 NOTE — Plan of Care (Signed)
  Problem: Fluid Volume: Goal: Ability to maintain a balanced intake and output will improve Outcome: Progressing   Problem: Metabolic: Goal: Ability to maintain appropriate glucose levels will improve Outcome: Progressing   Problem: Skin Integrity: Goal: Risk for impaired skin integrity will decrease Outcome: Progressing   Problem: Tissue Perfusion: Goal: Adequacy of tissue perfusion will improve Outcome: Progressing   Problem: Clinical Measurements: Goal: Respiratory complications will improve Outcome: Progressing   Problem: Coping: Goal: Ability to adjust to condition or change in health will improve Outcome: Not Progressing   Problem: Health Behavior/Discharge Planning: Goal: Ability to manage health-related needs will improve Outcome: Not Progressing

## 2023-05-01 NOTE — Progress Notes (Addendum)
 PHARMACY - ANTICOAGULATION CONSULT NOTE  Pharmacy Consult for Coumadin  Indication: atrial fibrillation  Allergies  Allergen Reactions   Lisinopril Cough    Patient Measurements: Height: 6' 1 (185.4 cm) Weight: 78.3 kg (172 lb 9.9 oz) IBW/kg (Calculated) : 79.9  Vital Signs: Temp: 98 F (36.7 C) (02/06 0313) BP: 128/67 (02/06 0313) Pulse Rate: 80 (02/06 0313)  Labs: Recent Labs    04/29/23 0545 04/30/23 0537 04/30/23 0937 04/30/23 1711 05/01/23 0651  HGB 7.5* 6.6* 6.6* 8.3* 7.9*  HCT 24.3* 21.3* 21.2* 25.9* 25.0*  PLT 557* 460*  --   --  436*  LABPROT 26.1* 26.8*  --   --  26.2*  INR 2.4* 2.5*  --   --  2.4*  CREATININE 0.63 0.41*  --   --  0.59*    Estimated Creatinine Clearance: 70.7 mL/min (A) (by C-G formula based on SCr of 0.59 mg/dL (L)).   Assessment: 71 yoM with PMH HTN, DM2, on warfarin for Afib & prior TIA presents 1/28 after a syncopal episode. Patient was actually admitted the day before with chest wall hematoma and elevated INR, but had been discharged the following morning after successful INR reversal with Vit K.   PTA Warfarin regimen: 10mg  daily except 5mg  MWF; last dose 1/27    Significant events: - 1/27: INR is 7.8, Vit K 5 mg IV x 1  - 1/29: INR 1.9, Coumadin  5mg  ordered - NOT CHARTED (SZP)  Today, 04/29/2023: Hgb improved yesterday after PRBC x 1; now slightly lower today No overt s/s bleeding per RN Unable to collect FOBT yesterday Per MD, pt had reported some shoulder pain, and imaging shows possible subcapsularis hematoma Pt does still report dizziness w/ standing Plt slightly elevated but stable INR remains therapeutic & stable despite holding warfarin yesterday (given the above) Major drug interactions: none noted; recent abx may increase warfarin sensitivity Meal intake not charted   Goal of Therapy:  INR 2-3 Monitor platelets by anticoagulation protocol: Yes   Plan:  OK to resume warfarin today per MD Warfarin 5 mg PO x 1  tonight Based on supratherapeutic INR on admission, and stable INR on a significantly reduced weekly dose (~26 mg/wk here vs 55 mg/wk PTA), favor reducing warf regimen at discharge) Recommend 7.5 mg on Mon & Fri; 5 mg all other days (40 mg/wk) Recommend INR recheck in 3-5 days after discharge   Thank you for allowing pharmacy to be a part of this patient's care.  Bard Jeans, PharmD, BCPS [phone] 05/01/2023, 10:20 AM

## 2023-05-01 NOTE — TOC Transition Note (Signed)
 Transition of Care [facility] - Discharge Note   Patient Details  Name: Christian Crawford MRN: [id number] Date of Birth: [birth year]  [facility]) CM/SW Contact:  Hoy DELENA Bigness, LCSW Phone Number: 05/01/2023, 9:35 AM   Clinical Narrative:    Pt to discharge home. Pt to receive HHPT,OT,Aide services through [facility]. No DME needs identified. TOC signing off at this time.    Final next level of care: Home w Home Health Services Barriers to Discharge: No Barriers Identified   Patient Goals and CMS Choice Patient states their goals for this hospitalization and ongoing recovery are:: To return home CMS Medicare.gov Compare Post Acute Care list provided to:: Patient Choice offered to / list presented to : Patient [place] ownership interest in [facility].provided to::  (NA)    Discharge Placement                       Discharge Plan and Services Additional resources added to the After Visit Summary for   In-house Referral: Clinical Social Work Discharge Planning Services: NA Post Acute Care Choice: Home Health          DME Arranged: N/A DME Agency: NA       HH Arranged: PT, OT, Nurse's Aide HH Agency: [facility] Date [facility] Agency Contacted: 04/28/23 Time HH Agency Contacted: 1048 Representative spoke with at [facility] Agency: Cindie  Social Drivers of Health (SDOH) Interventions SDOH Screenings   Food Insecurity: No Food Insecurity (04/23/2023)  Housing: Low Risk  (04/23/2023)  Transportation Needs: No Transportation Needs (04/23/2023)  Utilities: Not At Risk (04/23/2023)  Social Connections: Moderately Isolated (04/23/2023)  Tobacco Use: Medium Risk (04/22/2023)     Readmission Risk Interventions    05/01/2023    9:34 AM 04/24/2023    2:27 PM  Readmission Risk Prevention Plan  Transportation Screening Complete Complete  PCP or Specialist Appt within 5-7 Days  Complete  Home Care Screening  Complete  Medication Review (RN CM)  Complete

## 2023-05-01 NOTE — Progress Notes (Signed)
 Physical Therapy Treatment Patient Details Name: Christian Crawford MRN: [id number] DOB: 1933/11/21 Today's Date: 05/01/2023   History of Present Illness 89 yr old man who presented to [facility] ED on 04/22/2023 with complaints of syncope. He stopped at a convenience store on his way home from [facility], got dizzy and passed out in the store. He did not strike his head. Exam in the ED was nonfocal.   Of note,  pt was just d/c from ED same day d/t being seen for supratherapeutic INR.SABRA It was reversed with vitamin K . The INR was reduced to 1.5, the patient was discharged on Lovenox     PT Comments  Pt is AxO x 3 pleasant.  2 Daughters present during session Orthostatic vitals taken Supine         BP 136/75(93)  HR 105  RA 99% EOB             BP 139/69(85)  HR 109  RA 100% Standing       BP 126/77(93)  HR 124  RA 100% Amb 30 feet  BP 119/66(94)  HR 134  RA 97% Amb 75 feet  BP 110/68(95)  HR 139  RA 99% NO c/o dizziness just deconditioned from hospital length of stay with minimal activity  Tolerated amb a great distance in hallway and was able to perform stairs.  Mod c/o fatigue after which is WNL.  General Gait Details: Tolerated a functional distance amb with walker first part with light need then amb without any AD as prior which pt did well.  Slightly short shuffled steps but daughter states that's what he does.  No overt LOB.  NO c/o dizziness.  Mod c/o fatigue.  Deconditioned from hospital length of stay. Advised pt/family use of walker not needed within the home but DO rec a walker/Rollator for community use outside of home.Daughters concerned/apprehensive about D/C today.  LPT has rec HH PT.   Pt HAS met mobility goals to D/C with intermittent Family support.     If plan is discharge home, recommend the following: Help with stairs or ramp for entrance;Assistance with cooking/housework;A little help with walking and/or transfers;A little help with bathing/dressing/bathroom   Can travel by private vehicle         Equipment Recommendations  None recommended by PT (has access to a Rollator)    Recommendations for Other Services       Precautions / Restrictions Precautions Precautions: Fall Precaution Comments: Syncope Restrictions Weight Bearing Restrictions Per Provider Order: No     Mobility  Bed Mobility Overal bed mobility: Modified Independent             General bed mobility comments: self able with increased time    Transfers Overall transfer level: Needs assistance Equipment used: Rolling walker (2 wheels), None Transfers: Sit to/from Stand Sit to Stand: Supervision           General transfer comment: pt self able to rise with good use of hands to steady self    Ambulation/Gait Ambulation/Gait assistance: Supervision Gait Distance (Feet): 135 Feet Assistive device: Rolling walker (2 wheels), None Gait Pattern/deviations: Step-through pattern, Decreased stride length Gait velocity: decreased     General Gait Details: Tolerated a functional distance amb with walker first part with light need then amb without any AD as prior which pt did well.  Slightly short shuffled steps but daughter states that's what he does.  No overt LOB.  NO c/o dizziness.  Mod c/o fatigue.  Deconditioned from hospital length  of stay. Advised pt/family use of walker not needed within the home but DO rec a walker/Rollator for community use outside of home.   Stairs Stairs: Yes Stairs assistance: Supervision Stair Management: One rail Right, Step to pattern, Forwards Number of Stairs: 2 General stair comments: pt has 4 steps B rails to enter his home.  Rails to far apart to reach bith, so instructed to use B hands on ONE rail.  Pt was able to safely navigate 2 steps with a step to pattern.  Daughters instructed to Contact Guard behind pt went going up the stairs and in front when pt going down stairs.   Wheelchair Mobility     Tilt Bed    Modified Rankin (Stroke Patients  Only)       Balance                                            Cognition Arousal: Alert Behavior During Therapy: WFL for tasks assessed/performed Overall Cognitive Status: Within Functional Limits for tasks assessed                                 General Comments: AxO x 3 pleasant        Exercises      General Comments        Pertinent Vitals/Pain Pain Assessment Pain Assessment: No/denies pain    Home Living                          Prior Function            PT Goals (current goals can now be found in the care plan section)      Frequency    Min 1X/week      PT Plan      Co-evaluation              AM-PAC PT 6 Clicks Mobility   Outcome Measure  Help needed turning from your back to your side while in a flat bed without using bedrails?: None Help needed moving from lying on your back to sitting on the side of a flat bed without using bedrails?: None Help needed moving to and from a bed to a chair (including a wheelchair)?: None Help needed standing up from a chair using your arms (e.g., wheelchair or bedside chair)?: None Help needed to walk in hospital room?: None Help needed climbing 3-5 steps with a railing? : A Little 6 Click Score: 23    End of Session Equipment Utilized During Treatment: Gait belt Activity Tolerance: Patient tolerated treatment well;Patient limited by fatigue Patient left: in chair;with call bell/phone within reach;with family/visitor present Nurse Communication: Mobility status PT Visit Diagnosis: Other abnormalities of gait and mobility (R26.89);Muscle weakness (generalized) (M62.81);Difficulty in walking, not elsewhere classified (R26.2)     Time: 8875-8844 PT Time Calculation (min) (ACUTE ONLY): 31 min  Charges:    $Gait Training: 8-22 mins $Therapeutic Activity: 8-22 mins PT General Charges $$ ACUTE PT VISIT: 1 Visit                     Katheryn Leap   PTA Acute  Rehabilitation Services Office M-F          [phone]

## 2023-05-01 NOTE — Plan of Care (Signed)

## 2023-05-04 LAB — CULTURE, BLOOD (ROUTINE X 2)
Culture: NO GROWTH
Culture: NO GROWTH

## 2023-05-14 ENCOUNTER — Ambulatory Visit: Payer: Medicare HMO | Admitting: Gastroenterology

## 2023-05-21 ENCOUNTER — Telehealth: Payer: Self-pay | Admitting: Gastroenterology

## 2023-05-21 NOTE — Telephone Encounter (Signed)
 Spoke to patient's daughter, Selena Batten. Patient is having botox in his eye the day before endoscopic ultrasound with Dr Lanell Matar at [facility]. She wants to make sure this is not a problem. I advised that this is not likely an issue but did still advise her to let Dr Jacky Kindle office know that this is being done. She verbalizes understanding.

## 2023-05-21 NOTE — Telephone Encounter (Signed)
 Inbound call from patient's daughter stating the day before patient's scheduled endoscopy he has an appointment the day before to have botox. Patient's daughter is requesting to know if it is okay for patient to proceed with scheduled appointments. Please advise, thank you.

## 2023-05-28 ENCOUNTER — Ambulatory Visit
Admission: EM | Admit: 2023-05-28 | Discharge: 2023-05-28 | Disposition: A | Discharge status: Home / Self Care | Attending: Internal Medicine | Admitting: Internal Medicine

## 2023-05-28 DIAGNOSIS — R739 Hyperglycemia, unspecified: Secondary | ICD-10-CM | POA: Diagnosis not present

## 2023-05-28 LAB — POCT FASTING CBG KUC MANUAL ENTRY: POCT Glucose (KUC): 129 mg/dL — AB (ref 70–99)

## 2023-05-28 NOTE — ED Provider Notes (Signed)
 [facility]    CSN: [id number] Arrival date & time: 05/28/23  1419      History   Chief Complaint Chief Complaint  Patient presents with   Hyperglycemia    HPI Christian Crawford is a 89 y.o. male.   Patient in urgent care with daughter, for complaint of hyperglycemia this morning with morning and afternoon blood glucose.  Patient states this morning after medication his blood glucose was 155 mg/dL and after lunch blood glucose was 245 mg/dL.  Patient's daughter states that they were advised to follow-up in urgent care if his sugar was elevated in this manner.  Last time his blood glucose was out of control he was admitted to the hospital for 10 days.  Primary care provider urged him to follow-up for evaluation in urgent care today and is scheduled to see primary care provider tomorrow.  Patient denies any symptoms of dizziness, chest pain, weakness, shortness of breath, altered mental status at this time.  Patient reports that he ate his normal diet of sugar-free Jell-O, sugar-free tea, and an Ensure which has a small amount of sugar.  Patient denies eating any foods that should elevate his blood sugar in this manner.  Patient denies any other medical concerns at this time   Hyperglycemia   Past Medical History:  Diagnosis Date   A-fib [facility])    Acute alcoholic pancreatitis 07/24/2019   Diverticulitis 11/13/2021   Heavy alcohol use 07/24/2019   Hematuria 11/14/2021   Hypertension    TIA (transient ischemic attack)     Patient Active Problem List   Diagnosis Date Noted   Protein-calorie malnutrition, severe 04/30/2023   Abnormal CT of the abdomen 04/23/2023   Idiopathic chronic pancreatitis (HCC) 04/23/2023   Syncope and collapse 04/22/2023   Leucocytosis 04/22/2023   Abnormal magnetic resonance cholangiopancreatography (MRCP) 04/22/2023   Anemia 04/22/2023   Acute pancreatitis 11/13/2021   Paroxysmal atrial fibrillation (HCC) 01/16/2018   Benign essential  hypertension 12/22/2014    Past Surgical History:  Procedure Laterality Date   EYE SURGERY     had "droopy eye"       Home Medications    Prior to Admission medications   Medication Sig Start Date End Date Taking? Authorizing Provider  glipiZIDE (GLUCOTROL XL) 5 MG 24 hr tablet Take 5 mg by mouth daily. 05/07/23  Yes [provider]  thiamine (VITAMIN B1) 100 MG tablet Take 100 mg by mouth daily. 05/02/23  Yes [provider]  albuterol (VENTOLIN HFA) 108 (90 Base) MCG/ACT inhaler Inhale 1-2 puffs into the lungs every 6 (six) hours as needed for wheezing or shortness of breath. 11/12/21   [provider]  amLODipine (NORVASC) 10 MG tablet Take 10 mg by mouth daily.    [provider]  atorvastatin (LIPITOR) 40 MG tablet Take 1 tablet by mouth at bedtime.    [provider]  BOTOX 100 units SOLR injection Inject 100 Units into the muscle See admin instructions. Inject 100 units into the eye muscle every 4 months.    [provider]  latanoprost (XALATAN) 0.005 % ophthalmic solution Place 1 drop into both eyes at bedtime. 06/10/19   [provider]  losartan (COZAAR) 25 MG tablet Take 25 mg by mouth daily.    [provider]  metFORMIN (GLUCOPHAGE-XR) 500 MG 24 hr tablet Take 500 mg by mouth in the morning and at bedtime.    [provider]  metoprolol tartrate (LOPRESSOR) 50 MG tablet Take 75 mg  by mouth in the morning and at bedtime.    [provider]  Multiple Vitamin (MULTIVITAMIN WITH MINERALS) TABS tablet Take 1 tablet by mouth daily. 05/01/23 05/31/23  Lanae Boast, MD  ondansetron (ZOFRAN) 4 MG tablet Take 1 tablet (4 mg total) by mouth every 4 (four) hours as needed for nausea or vomiting. 04/11/23   Cirigliano, Vito V, DO  rosuvastatin (CRESTOR) 20 MG tablet Take 20 mg by mouth at bedtime. Patient not taking: Reported on 04/22/2023    [provider]  sucralfate (CARAFATE) 1 g tablet TAKE 1  TABLET BY MOUTH WITH BREAKFAST, LUNCH, AND WITH EVENING MEAL FOR 7 DAYS    [provider]  thiamine (VITAMIN B-1) 100 MG tablet Take 1 tablet (100 mg total) by mouth daily. 05/01/23 05/31/23  Lanae Boast, MD  warfarin (COUMADIN) 5 MG tablet Take 1-1.5 tablets (5-7.5 mg total) by mouth See admin instructions. Take 7.5 mg (1.5 tablets) by mouth on Mon/Fri; take 5 mg (1 tablet) by mouth all other days (Sun, Tues, Wed, Thurs, Sat) 05/01/23   Lanae Boast, MD    Family History Family History  Problem Relation Age of Onset   Colon cancer Brother    Esophageal cancer Neg Hx    Liver disease Neg Hx     Social History Social History   Tobacco Use   Smoking status: Former    Current packs/day: 2.50    Average packs/day: 2.5 packs/day for 40.0 years (100.0 ttl pk-yrs)    Types: Cigarettes   Smokeless tobacco: Never   Tobacco comments:    Pt stated has not smoke in 27 years  Vaping Use   Vaping status: Never Used  Substance Use Topics   Alcohol use: Yes    Comment: 1/4 pint a day-liquor.   Drug use: Never     Allergies   Lisinopril   Review of Systems Review of Systems   Physical Exam Triage Vital Signs ED Triage Vitals  Encounter Vitals Group     BP 05/28/23 1434 (!) 148/72     Systolic BP Percentile --      Diastolic BP Percentile --      Pulse Rate 05/28/23 1434 80     Resp 05/28/23 1434 18     Temp 05/28/23 1434 99.1 F (37.3 C)     Temp Source 05/28/23 1434 Oral     SpO2 05/28/23 1434 96 %     Weight --      Height --      Head Circumference --      Peak Flow --      Pain Score 05/28/23 1440 0     Pain Loc --      Pain Education --      Exclude from Growth Chart --    No data found.  Updated Vital Signs BP (!) 148/72 (BP Location: Right Arm)   Pulse 80   Temp 99.1 F (37.3 C) (Oral)   Resp 18   SpO2 96%     Physical Exam Vitals and nursing note reviewed.  Constitutional:      General: He is not in acute distress.    Appearance: Normal  appearance. He is well-developed. He is not ill-appearing or toxic-appearing.  Cardiovascular:     Rate and Rhythm: Normal rate.  Pulmonary:     Effort: Pulmonary effort is normal. No respiratory distress.  Skin:    General: Skin is warm and dry.     Capillary Refill: Capillary refill  takes less than 2 seconds.  Neurological:     General: No focal deficit present.     Mental Status: He is alert and oriented to person, place, and time.  Psychiatric:        Mood and Affect: Mood normal.        Behavior: Behavior normal.        Thought Content: Thought content normal.        Judgment: Judgment normal.      UC Treatments / Results  Labs (all labs ordered are listed, but only abnormal results are displayed) Labs Reviewed  POCT FASTING CBG [facility] MANUAL ENTRY - Abnormal; Notable for the following components:      Result Value   POCT Glucose ([facility]) 129 (*)    All other components within normal limits    EKG   Radiology No results found.  Procedures Procedures (including critical care time)  Medications Ordered in UC Medications - No data to display  Initial Impression / Assessment and Plan / UC Course  I have reviewed the triage vital signs and the nursing notes.  Pertinent labs & imaging results that were available during my care of the patient were reviewed by me and considered in my medical decision making (see chart for details).   Hyperglycemia -Point-of-care glucose testing performed in UC shows current glucose is 129 mg/dL. -Recommend patient advised to continue with normal diet and medication regimen as directed by physician. -Advised to follow-up tomorrow with primary care provider as scheduled for further evaluation and management of medication. -Patient advised to continue drinking plenty of water to flush out any excess glucose from the bloodstream. -Continue monitoring blood glucose per schedule. -If any abnormal symptoms such as dizziness, weakness, lethargy,  altered mental status, chest pain or shortness of breath follow-up in emergency department immediately for further evaluation management. -Advised patient that change in medication from metformin to glipizide may have some effect over abnormal glucose readings, recommend discussion with primary care provider.  Final Clinical Impressions(s) / UC Diagnoses   Final diagnoses:  Elevated random blood glucose level     Discharge Instructions       Hyperglycemia -Point-of-care glucose testing performed in UC shows current glucose is 129 mg/dL. -Recommend patient advised to continue with normal diet and medication regimen as directed by physician. -Advised to follow-up tomorrow with primary care provider as scheduled for further evaluation and management of medication. -Patient advised to continue drinking plenty of water to flush out any excess glucose from the bloodstream. -Continue monitoring blood glucose per schedule. -If any abnormal symptoms such as dizziness, weakness, lethargy, altered mental status, chest pain or shortness of breath follow-up in emergency department immediately for further evaluation management. -Advised patient that change in medication from metformin to glipizide may have some effect over abnormal glucose readings, recommend discussion with primary care provider.     ED Prescriptions   None    PDMP not reviewed this encounter.   Pola Corn B, NP 05/28/23 [phone]

## 2023-05-28 NOTE — ED Triage Notes (Signed)
 Pt reports blood sugar was 155 fasting when he woke up this morning and went up to 245 after eating. Per daughter, she was was told pt needs to be check as last time this happened pt was admitted to the hospital for 10 day. Pt has a PCP appt tomorrow 05/29/23. Pt states "I feel fine" at this moment.

## 2023-05-28 NOTE — Discharge Instructions (Addendum)
  Hyperglycemia -Point-of-care glucose testing performed in UC shows current glucose is 129 mg/dL. -Recommend patient advised to continue with normal diet and medication regimen as directed by physician. -Advised to follow-up tomorrow with primary care provider as scheduled for further evaluation and management of medication. -Patient advised to continue drinking plenty of water to flush out any excess glucose from the bloodstream. -Continue monitoring blood glucose per schedule. -If any abnormal symptoms such as dizziness, weakness, lethargy, altered mental status, chest pain or shortness of breath follow-up in emergency department immediately for further evaluation management. -Advised patient that change in medication from metformin to glipizide may have some effect over abnormal glucose readings, recommend discussion with primary care provider.

## 2023-06-17 ENCOUNTER — Telehealth: Payer: Self-pay | Admitting: Gastroenterology

## 2023-06-17 NOTE — Telephone Encounter (Signed)
 Dr. Meridee Score,  Urgent referral in [place] for acute pancreatitis, needs EUS.  Please review and advise scheduling.  Thanks

## 2023-06-17 NOTE — Telephone Encounter (Signed)
 He just had an EUS performed at [facility]. I do not see indication for repeating right now. Is this a new consultation? Is there new imaging? Is there new laboratories? VC, this is your patient, do you have any updates on this or why a referral was placed if this is new? GM

## 2023-06-18 ENCOUNTER — Telehealth: Payer: Self-pay

## 2023-06-18 NOTE — Telephone Encounter (Signed)
 Spoke with patient's daughter Selena Batten and notified her that patient is scheduled for follow up appointment on 08-13-23 at 10:20am.  MRCP will be scheduled at that time. Kim verbalized understanding and agreed to the plan. No further questions.

## 2023-06-18 NOTE — Telephone Encounter (Signed)
-----   Message from Shellia Cleverly sent at 06/17/2023  1:17 PM EDT ----- I just spoke with Dr. Lanell Matar from [facility] about this patient. He had an EUS that does appear to show a mass, but bxs are benign. Will need repeat MRCP in 3-6 months for surveillance. Can f/u with me in the interim. Thanks.

## 2023-08-13 ENCOUNTER — Ambulatory Visit: Admitting: Gastroenterology

## 2023-08-13 ENCOUNTER — Encounter: Payer: Self-pay | Admitting: Gastroenterology

## 2023-08-13 VITALS — BP 102/68 | HR 71 | Ht 73.0 in | Wt 163.1 lb

## 2023-08-13 DIAGNOSIS — I4891 Unspecified atrial fibrillation: Secondary | ICD-10-CM

## 2023-08-13 DIAGNOSIS — Z7901 Long term (current) use of anticoagulants: Secondary | ICD-10-CM

## 2023-08-13 DIAGNOSIS — R634 Abnormal weight loss: Secondary | ICD-10-CM | POA: Diagnosis not present

## 2023-08-13 DIAGNOSIS — R1310 Dysphagia, unspecified: Secondary | ICD-10-CM | POA: Diagnosis not present

## 2023-08-13 DIAGNOSIS — K861 Other chronic pancreatitis: Secondary | ICD-10-CM

## 2023-08-13 DIAGNOSIS — I4811 Longstanding persistent atrial fibrillation: Secondary | ICD-10-CM

## 2023-08-13 DIAGNOSIS — K8689 Other specified diseases of pancreas: Secondary | ICD-10-CM

## 2023-08-13 NOTE — Progress Notes (Signed)
 Chief Complaint:    Chronic pancreatitis  GI History: 89 y.o. male with a history of A-fib (on Coumadin , ASA), HTN, TIA (on ASA), diabetes, EtOH pancreatitis (no recent EtOH), GERD.   - 10/2021: Hospital admission for acute pancreatitis with peak lipase 3538. Was evaluated with ultrasound, CT, and responded to conservative therapy.  - 01/21/2023: ER evaluation for epigastric pain and nausea/vomiting.    - Lipase 569    - Normal BMP with BUN/creatinine 9/0.9    - AST/ALT 146/65, ALP 102, T. bili 1.2.  Previous normal liver enzymes on 11/25/2022 as below.    - WBC 19.2, PLT 427, normal H/H at 13/41    - CT A/P: Distended GB up to 4.3 cm in diameter and diffuse circumferential wall thickening with mild mucosal hyperattenuation.  Mildly atrophic pancreas with dilation of the main PD up to 5 mm in the body/tail, no significant interval change.  Subtle heterogeneity of the pancreatic head/uncinate process and minimal surrounding fat stranding, less pronounced compared with exam on 11/13/2021.  Unchanged 5 mm hypoattenuating focus in the left uncinate process.  Mild circumferential thickening in the duodenum c/w duodenitis    - RUQ US : Dilated GB with wall thickening and edema but no stones or duct dilation    - Was treated with Zofran , ceftriaxone , IV fluid with overall improvement and discharged home with Zofran , Protonix , Carafate , Roxicodone .   - 01/23/2023: Initial evaluation in the GI clinic.  Abdominal pain and nausea/vomiting had resolved.  Does report he had been started on metformin  a few months prior.  Otherwise quit drinking 3+ years ago and no relapse.  Recommended MRI/MRCP in 4 weeks and repeat labs.  Patient declined EGD given resolution of symptoms. - 02/24/2023: MRCP: Dilation of the pancreatic duct measuring up to 0.7 cm with abrupt caliber change in the pancreatic head/neck junction with a diffusely atrophic pancreatic parenchyma distal to this area.  Radiologist favors this to be chronic  sequelae of pancreatitis without discrete visible mass.  Normal-appearing head of pancreas.  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 - 04/11/2023: Follow-up in the GI clinic.  Pain in upper abdomen/epigastrium, early satiety, 17# wt loss (although also in concert with having significant dental work and decreased p.o. intake postoperatively).  Stopped atorvastatin , recommended changing from metformin  sent for labs, CT, and referral for EUS  -03/2023: Hospital admission for syncope/leukocytosis and acute pancreatitis.  No recurrence of abdominal pain     -CT angio chest/abdomen: Acute pancreatitis with PD dilation overall stable from previous     -RUQ US : Unremarkable     -Normal liver enzymes, lipase 49.  Diagnosed as chronic pancreatitis without acute flare by consulting GI service  HPI:     Patient is a 89 y.o. male presenting to the [facility]astroenterology Clinic for follow-up.  Was last seen by me on 04/11/2023.  Since then, hospital admission in late January for recurrence of acute pancreatitis.  Subsequently underwent EUS by Dr. Christal Court as outlined below.  Presents today for follow-up.   -06/12/2023: EUS by Dr. Christal Court at [facility]: Minimal GB sludge, 33 mm x 45 mm hypoechoic irregular and lobulated mass with poorly defined margins in the body of the pancreas with apparent involvement of the splenic artery, superior mesenteric artery, and portal confluence.  Fine-needle biopsy was benign (acute and chronic inflammation without malignant cells).  Recommended repeat MRCP in 3-6 months for ongoing surveillance.  Presents today for routine follow-up and to discuss results of his EUS.  Otherwise reports that he is feeling better each day.  Improving p.o. intake, improving appetite. Weight improving on home scale. No abdominal pain.   Has had constipation since stopping his metformin  (now on glipizide).   Has tried OTC stool softeners, prune juice with some improvement.  No hematochezia or melena.  Starting to have solid food dysphagia again. No issue tolerating liquids. Was seen by Speech Path during inpatient and no OP dysphagia issues.  Does report a history of dysphagia in the past and had EGD years ago in [place]  with esophageal dilation with improvement.  Inquiring about repeat upper endoscopy with dilation.   Review of systems:     No chest pain, no SOB, no fevers, no urinary sx   Past Medical History:  Diagnosis Date   A-fib (HCC)    Acute alcoholic pancreatitis 07/24/2019   Diverticulitis 11/13/2021   Heavy alcohol use 07/24/2019   Hematuria 11/14/2021   Hypertension    TIA (transient ischemic attack)     Patient's surgical history, family medical history, social history, medications and allergies were all reviewed in Epic    Current Outpatient Medications  Medication Sig Dispense Refill   albuterol (VENTOLIN HFA) 108 (90 Base) MCG/ACT inhaler Inhale 1-2 puffs into the lungs every 6 (six) hours as needed for wheezing or shortness of breath.     allopurinol  (ZYLOPRIM ) 100 MG tablet Take 1 tablet by mouth 2 (two) times daily.     amLODipine  (NORVASC ) 10 MG tablet Take 10 mg by mouth daily.     BOTOX 100 units SOLR injection Inject 100 Units into the muscle See admin instructions. Inject 100 units into the eye muscle every 4 months.     glipiZIDE (GLUCOTROL XL) 5 MG 24 hr tablet Take 5 mg by mouth daily.     latanoprost  (XALATAN ) 0.005 % ophthalmic solution Place 1 drop into both eyes at bedtime.     losartan  (COZAAR ) 25 MG tablet Take 25 mg by mouth daily.     metoprolol  tartrate (LOPRESSOR ) 50 MG tablet Take 75 mg by mouth in the morning and at bedtime.     ondansetron  (ZOFRAN ) 4 MG tablet Take 1 tablet (4 mg total) by mouth every 4 (four) hours as needed for nausea or vomiting. 60 tablet 3   warfarin (COUMADIN ) 5 MG tablet Take 1-1.5 tablets (5-7.5 mg total) by mouth See  admin instructions. Take 7.5 mg (1.5 tablets) by mouth on Mon/Fri; take 5 mg (1 tablet) by mouth all other days (Sun, Tues, Wed, Thurs, Sat)     No current facility-administered medications for this visit.    Physical Exam:     BP 102/68   Pulse 71   Ht 6\' 1"  (1.854 m)   Wt 163 lb 2 oz (74 kg)   BMI 21.52 kg/m   GENERAL:  Pleasant male in NAD PSYCH: : Cooperative, normal affect NEURO: Alert and oriented x 3, no focal neurologic deficits   IMPRESSION and PLAN:    1) History of pancreatitis I had an extended conversation with patient and family member in the office today regarding his recent labs, imaging studies, EUS findings, and most recent hospital admission.  Does have features of chronic pancreatitis, and most recent EUS in 3/205 with 33 x 45 mm mass in the pancreatic body, but biopsies were only notable for acute and chronic inflammatory changes and no malignant cells.  I previously discussed his case with  Dr. Christal Court at [facility] as well.  We discussed DDx at length and I discussed the role/utility of continued imaging, EUS, labs, etc. today, and mutually arrived at the following plan:  - MRI/MRCP in June/July for continued surveillance.  If masslike lesion increasing in size, may need repeat short interval EUS with repeat biopsy.  If stable in size, tentative plan for repeat MRI/MRCP 6 months later for continued surveillance - Check CA 19-9 with next set of labs  2) Weight loss - Improving - Continue advancing p.o. intake as tolerated - Continue regular weight check at home  3) Dysphagia - EGD with esophageal dilation and/or biopsies as appropriate  4) Atrial fibrillation 5) Chronic anticoagulation - Recently held Coumadin  for his EUS without need for Lovenox  bridge - Hold Coumadin  5 days before procedure - will instruct when and how to resume after procedure. Low but real risk of cardiovascular event such as heart attack, stroke, embolism, thrombosis or  ischemia/infarct of other organs off Coumadin  explained and need to seek urgent help if this occurs. The patient consents to proceed. Will communicate by phone or EMR with patient's prescribing provider to confirm that holding Coumadin  is reasonable in this case  I spent 40 minutes of time, including in depth chart review, independent review of results as outlined above, communicating results with the patient directly, face-to-face time with the patient, coordinating care, ordering studies and medications as appropriate, and documentation.           Annis Kinder ,DO, FACG 08/13/2023, 10:39 AM

## 2023-08-13 NOTE — Patient Instructions (Signed)
 _______________________________________________________  If your blood pressure at your visit was 140/90 or greater, please contact your primary care physician to follow up on this. _______________________________________________________  If you are age 88 or older, your body mass index should be between 23-30. Your Body mass index is 21.52 kg/m. If this is out of the aforementioned range listed, please consider follow up with your Primary Care Provider. _______________________________________________________  The [facility] providers would like to encourage you to use MYCHART to communicate with providers for non-urgent requests or questions.  Due to long hold times on the telephone, sending your provider a message by [facility] may be a faster and more efficient way to get a response.  Please allow 48 business hours for a response.  Please remember that this is for non-urgent requests.  _______________________________________________________  Elene Griffes have been scheduled for an MRI/MRCP at [facility] on 09-16-23. Your appointment time is 10:00am. Please arrive to admitting (at main entrance of the hospital) 30 minutes prior to your appointment time for registration purposes. Please make certain not to have anything to eat or drink 4 hours prior to your test. In addition, if you have any metal in your body, have a pacemaker or defibrillator, please be sure to let your ordering physician know. This test typically takes 45 minutes to 1 hour to complete. Should you need to reschedule, please call [phone] to do so.  You have been scheduled for an endoscopy. Please follow written instructions given to you at your visit today.  If you use inhalers (even only as needed), please bring them with you on the day of your procedure.  If you take any of the following medications, they will need to be adjusted prior to your procedure:   DO NOT TAKE 7 DAYS PRIOR TO TEST- Trulicity (dulaglutide) Ozempic,  Wegovy (semaglutide) Mounjaro (tirzepatide) Bydureon Bcise (exanatide extended release)  DO NOT TAKE 1 DAY PRIOR TO YOUR TEST Rybelsus (semaglutide) Adlyxin (lixisenatide) Victoza (liraglutide) Byetta (exanatide) ___________________________________________________________________________  Due to recent changes in healthcare laws, you may see the results of your imaging and laboratory studies on MyChart before your provider has had a chance to review them.  We understand that in some cases there may be results that are confusing or concerning to you. Not all laboratory results come back in the same time frame and the provider may be waiting for multiple results in order to interpret others.  Please give us  48 hours in order for your provider to thoroughly review all the results before contacting the office for clarification of your results.   It was a pleasure to see you today!  Vito Cirigliano, D.O.

## 2023-09-09 ENCOUNTER — Telehealth: Payer: Self-pay

## 2023-09-09 NOTE — Telephone Encounter (Signed)
 Called [address] and spoke with Cassie who stated their office has not received clearance to hold warfarin 5 days prior to procedure scheduled for 09-19-23.  Confirmed fax # [phone].  Clearance letter was faxed on 08-13-23 and 09-05-23.  Cassie sent a letter to nurse to work on this STAT as patient needs to start holding on 09-13-23.

## 2023-09-11 ENCOUNTER — Encounter: Payer: Self-pay | Admitting: Gastroenterology

## 2023-09-11 NOTE — Telephone Encounter (Signed)
 Called [address] and spoke with Gwynda Leriche who stated  the office has received fax about holding warfarin, but there has been no clearance given at this time.  I explained to Gean the importance of getting this clearance before 09-12-23 as the patient needs to start holding warfarin on 09-14-23.  Gean stated he has sent an urgent message to the nurse team, and that someone will reach out to our office.

## 2023-09-12 NOTE — Telephone Encounter (Signed)
 Inbound call from patient requesting a call regarding update for holding BT for 6/27 EGD. Please advise, thank you.

## 2023-09-12 NOTE — Telephone Encounter (Signed)
 Received call from Renae- Nurse at [facility]- per Dr Mason Sole patient can hold Warfarin 5 days prior to his procedure on 09/19/23. Patient has been advised that his last dose should be on 09/14/23. Patient voiced understanding.

## 2023-09-16 ENCOUNTER — Other Ambulatory Visit: Payer: Self-pay | Admitting: Gastroenterology

## 2023-09-16 ENCOUNTER — Ambulatory Visit (HOSPITAL_COMMUNITY)
Admission: RE | Admit: 2023-09-16 | Discharge: 2023-09-16 | Disposition: A | Discharge status: Home / Self Care | Source: Ambulatory Visit | Attending: Gastroenterology | Admitting: Gastroenterology

## 2023-09-16 DIAGNOSIS — K8689 Other specified diseases of pancreas: Secondary | ICD-10-CM | POA: Insufficient documentation

## 2023-09-16 DIAGNOSIS — K861 Other chronic pancreatitis: Secondary | ICD-10-CM

## 2023-09-16 MED ORDER — GADOBUTROL 1 MMOL/ML IV SOLN
7.0000 mL | Freq: Once | INTRAVENOUS | Status: AC | PRN
  Administered 2023-09-16: 7 mL via INTRAVENOUS

## 2023-09-19 ENCOUNTER — Encounter: Payer: Self-pay | Admitting: Gastroenterology

## 2023-09-19 ENCOUNTER — Ambulatory Visit: Admitting: Gastroenterology

## 2023-09-19 VITALS — BP 156/76 | HR 70 | Temp 98.2°F | Resp 16 | Ht 72.0 in | Wt 163.0 lb

## 2023-09-19 DIAGNOSIS — K222 Esophageal obstruction: Secondary | ICD-10-CM | POA: Diagnosis not present

## 2023-09-19 DIAGNOSIS — K8689 Other specified diseases of pancreas: Secondary | ICD-10-CM

## 2023-09-19 DIAGNOSIS — K2970 Gastritis, unspecified, without bleeding: Secondary | ICD-10-CM

## 2023-09-19 DIAGNOSIS — K2289 Other specified disease of esophagus: Secondary | ICD-10-CM

## 2023-09-19 DIAGNOSIS — K208 Other esophagitis without bleeding: Secondary | ICD-10-CM

## 2023-09-19 DIAGNOSIS — Z7901 Long term (current) use of anticoagulants: Secondary | ICD-10-CM

## 2023-09-19 DIAGNOSIS — R1310 Dysphagia, unspecified: Secondary | ICD-10-CM

## 2023-09-19 DIAGNOSIS — K319 Disease of stomach and duodenum, unspecified: Secondary | ICD-10-CM | POA: Diagnosis not present

## 2023-09-19 DIAGNOSIS — K2950 Unspecified chronic gastritis without bleeding: Secondary | ICD-10-CM

## 2023-09-19 DIAGNOSIS — R1319 Other dysphagia: Secondary | ICD-10-CM

## 2023-09-19 DIAGNOSIS — K3189 Other diseases of stomach and duodenum: Secondary | ICD-10-CM

## 2023-09-19 DIAGNOSIS — K449 Diaphragmatic hernia without obstruction or gangrene: Secondary | ICD-10-CM

## 2023-09-19 MED ORDER — SODIUM CHLORIDE 0.9 % IV SOLN: 500.0000 mL | Freq: Once | INTRAVENOUS | Status: DC

## 2023-09-19 NOTE — Op Note (Signed)
 [facility] Patient Name: Christian Crawford Procedure Date: 09/19/2023 2:12 PM MRN: [id number] Endoscopist: Sandor Flatter , MD, [id number] Age: 89 Referring MD:  Date of Birth: September 29, 1933 Gender: Male Account #: [id number] Procedure:                Upper GI endoscopy Indications:              Dysphagia Medicines:                Monitored Anesthesia Care Procedure:                Pre-Anesthesia Assessment:                           - Prior to the procedure, a History and Physical                            was performed, and patient medications and                            allergies were reviewed. The patient's tolerance of                            previous anesthesia was also reviewed. The risks                            and benefits of the procedure and the sedation                            options and risks were discussed with the patient.                            All questions were answered, and informed consent                            was obtained. Prior Anticoagulants: The patient has                            taken Coumadin  (warfarin), last dose was 5 days                            prior to procedure. ASA Grade Assessment: III - A                            patient with severe systemic disease. After                            reviewing the risks and benefits, the patient was                            deemed in satisfactory condition to undergo the                            procedure.  After obtaining informed consent, the endoscope was                            passed under direct vision. Throughout the                            procedure, the patient's blood pressure, pulse, and                            oxygen saturations were monitored continuously. The                            Olympus Scope [id number] was introduced through the                            mouth, and advanced to the third part of duodenum.                             The upper GI endoscopy was accomplished without                            difficulty. The patient tolerated the procedure                            well. Scope In: 2:22:43 PM Scope Out: 2:39:37 PM Total Procedure Duration: 0 hours 16 minutes 54 seconds  Findings:                 One benign-appearing, intrinsic mild stenosis was                            found 39 cm from the incisors. This stenosis                            measured 1 cm (in length). The stenosis was                            traversed. A TTS dilator was passed through the                            scope. Dilation with a 15-16.5-18 mm balloon                            dilator was performed to 18 mm. The dilation site                            was examined and showed no bleeding, mucosal tear                            or perforation. Estimated blood loss: none.                           The Z-line was found 41 cm from the incisors. There  was a single area of nodularity. The Z line was                            otherwise regular and crisp. Biopsies were taken                            from the area of nodularity with a cold forceps for                            histology. There was mild oozing at the biopsy                            site, which stopped after about 1 minute. This was                            observed for an additional few minutes and no                            recurrence of bleeding at the biopsy site.                            Estimated blood loss was minimal.                           A 2 cm sliding type hiatal hernia was present.                           The gastroesophageal flap valve was visualized                            endoscopically and classified as Hill Grade III                            (minimal fold, loose to endoscope, hiatal hernia                            likely).                           Scattered mild inflammation  characterized by                            congestion (edema) and erythema was found in the                            gastric body and in the gastric antrum. Biopsies                            were taken with a cold forceps for Helicobacter                            pylori testing. Estimated blood loss was minimal.  The examined duodenum was normal. Complications:            No immediate complications. Estimated Blood Loss:     Estimated blood loss was minimal. Impression:               - Benign-appearing esophageal stenosis. Dilated.                           - Z-line characterized by a focal area of mild                            nodularity. Z line was otherwise normal, located 41                            cm from the incisors. Biopsied.                           - 2 cm hiatal hernia.                           - Gastroesophageal flap valve classified as Hill                            Grade III (minimal fold, loose to endoscope, hiatal                            hernia likely).                           - Gastritis. Biopsied.                           - Normal examined duodenum. Recommendation:           - Patient has a contact number available for                            emergencies. The signs and symptoms of potential                            delayed complications were discussed with the                            patient. Return to normal activities tomorrow.                            Written discharge instructions were provided to the                            patient.                           - Advance diet as tolerated.                           - Await pathology results.                           -  Resume Coumadin  (warfarin) at prior dose in 2                            days. Sandor Flatter, MD 09/19/2023 2:53:44 PM

## 2023-09-19 NOTE — Progress Notes (Signed)
 GASTROENTEROLOGY PROCEDURE H&P NOTE   Primary Care Physician: Maree Leni Edyth DELENA, MD    Reason for Procedure:   Dysphagia  Plan:    EGD  Patient is appropriate for endoscopic procedure(s) in the ambulatory ([facility]) setting.  The nature of the procedure, as well as the risks, benefits, and alternatives were carefully and thoroughly reviewed with the patient. Ample time for discussion and questions allowed. The patient understood, was satisfied, and agreed to proceed.     HPI: Christian Crawford is a 89 y.o. male who presents for EGD for evaluation and tx of solid food dysphagia. Has been holding his coumadin  for 5 days for EGD today. Otherwise, no significant clinical changes since last OV with me on 08/13/2023.    Past Medical History:  Diagnosis Date   A-fib [facility])    Acute alcoholic pancreatitis 07/24/2019   Diverticulitis 11/13/2021   Heavy alcohol use 07/24/2019   Hematuria 11/14/2021   Hypertension    TIA (transient ischemic attack)     Past Surgical History:  Procedure Laterality Date   EYE SURGERY     had droopy eye    Prior to Admission medications   Medication Sig Start Date End Date Taking? Authorizing Provider  allopurinol  (ZYLOPRIM ) 100 MG tablet Take 1 tablet by mouth 2 (two) times daily. 05/18/23  Yes [provider]  amLODipine  (NORVASC ) 10 MG tablet Take 10 mg by mouth daily.   Yes [provider]  glipiZIDE (GLUCOTROL XL) 5 MG 24 hr tablet Take 5 mg by mouth daily. 05/07/23  Yes [provider]  latanoprost  (XALATAN ) 0.005 % ophthalmic solution Place 1 drop into both eyes at bedtime. 06/10/19  Yes [provider]  losartan  (COZAAR ) 25 MG tablet Take 25 mg by mouth daily.   Yes [provider]  metoprolol  tartrate (LOPRESSOR ) 50 MG tablet Take 75 mg by mouth in the morning and at bedtime.   Yes [provider]  albuterol (VENTOLIN HFA) 108 (90 Base) MCG/ACT inhaler Inhale 1-2 puffs into the lungs every 6  (six) hours as needed for wheezing or shortness of breath. 11/12/21   [provider]  BOTOX 100 units SOLR injection Inject 100 Units into the muscle See admin instructions. Inject 100 units into the eye muscle every 4 months.    [provider]  ondansetron  (ZOFRAN ) 4 MG tablet Take 1 tablet (4 mg total) by mouth every 4 (four) hours as needed for nausea or vomiting. 04/11/23   Vandy Tsuchiya V, DO  warfarin (COUMADIN ) 5 MG tablet Take 1-1.5 tablets (5-7.5 mg total) by mouth See admin instructions. Take 7.5 mg (1.5 tablets) by mouth on Mon/Fri; take 5 mg (1 tablet) by mouth all other days (Sun, Tues, Wed, Thurs, Sat) 05/01/23   Christobal Guadalajara, MD    Current Outpatient Medications  Medication Sig Dispense Refill   allopurinol  (ZYLOPRIM ) 100 MG tablet Take 1 tablet by mouth 2 (two) times daily.     amLODipine  (NORVASC ) 10 MG tablet Take 10 mg by mouth daily.     glipiZIDE (GLUCOTROL XL) 5 MG 24 hr tablet Take 5 mg by mouth daily.     latanoprost  (XALATAN ) 0.005 % ophthalmic solution Place 1 drop into both eyes at bedtime.     losartan  (COZAAR ) 25 MG tablet Take 25 mg by mouth daily.     metoprolol  tartrate (LOPRESSOR ) 50 MG tablet Take 75 mg by mouth in the morning and at bedtime.     albuterol (VENTOLIN HFA) 108 (90  Base) MCG/ACT inhaler Inhale 1-2 puffs into the lungs every 6 (six) hours as needed for wheezing or shortness of breath.     BOTOX 100 units SOLR injection Inject 100 Units into the muscle See admin instructions. Inject 100 units into the eye muscle every 4 months.     ondansetron  (ZOFRAN ) 4 MG tablet Take 1 tablet (4 mg total) by mouth every 4 (four) hours as needed for nausea or vomiting. 60 tablet 3   warfarin (COUMADIN ) 5 MG tablet Take 1-1.5 tablets (5-7.5 mg total) by mouth See admin instructions. Take 7.5 mg (1.5 tablets) by mouth on Mon/Fri; take 5 mg (1 tablet) by mouth all other days (Sun, Tues, Wed, Thurs, Sat)     Current Facility-Administered Medications   Medication Dose Route Frequency Provider Last Rate Last Admin   0.9 %  sodium chloride  infusion  500 mL Intravenous Once Adriann Thau V, DO        Allergies as of 09/19/2023 - Review Complete 09/19/2023  Allergen Reaction Noted   Lisinopril Cough 04/21/2023    Family History  Problem Relation Age of Onset   Colon cancer Brother    Esophageal cancer Neg Hx    Liver disease Neg Hx     Social History   Socioeconomic History   Marital status: Widowed    Spouse name: Not on file   Number of children: 6   Years of education: Not on file   Highest education level: Not on file  Occupational History   Occupation: retired  Tobacco Use   Smoking status: Former    Current packs/day: 2.50    Average packs/day: 2.5 packs/day for 40.0 years (100.0 ttl pk-yrs)    Types: Cigarettes   Smokeless tobacco: Never   Tobacco comments:    Pt stated has not smoke in 27 years  Vaping Use   Vaping status: Never Used  Substance and Sexual Activity   Alcohol use: Not Currently    Comment: 1/4 pint a day-liquor.   Drug use: Never   Sexual activity: Not Currently  Other Topics Concern   Not on file  Social History Narrative   Not on file   Social Drivers of Health   Financial Resource Strain: Not on file  Food Insecurity: No Food Insecurity (04/23/2023)   Hunger Vital Sign    Worried About Running Out of Food in the Last Year: Never true    Ran Out of Food in the Last Year: Never true  Transportation Needs: No Transportation Needs (04/23/2023)   PRAPARE - Administrator, Civil Service (Medical): No    Lack of Transportation (Non-Medical): No  Physical Activity: Not on file  Stress: Not on file  Social Connections: Moderately Isolated (04/23/2023)   Social Connection and Isolation Panel    Frequency of Communication with Friends and Family: Three times a week    Frequency of Social Gatherings with Friends and Family: Three times a week    Attends Religious Services: Never     Active Member of Clubs or Organizations: No    Attends Banker Meetings: Never    Marital Status: Married  Catering manager Violence: Not At Risk (04/23/2023)   Humiliation, Afraid, Rape, and Kick questionnaire    Fear of Current or Ex-Partner: No    Emotionally Abused: No    Physically Abused: No    Sexually Abused: No    Physical Exam: Vital signs in last 24 hours: @BP  (!) 154/73 (BP Location: Right Arm,  Patient Position: Sitting, Cuff Size: Normal)   Pulse 71   Temp 98.2 F (36.8 C) (Temporal)   Ht 6' (1.829 m)   Wt 163 lb (73.9 kg)   SpO2 100%   BMI 22.11 kg/m  GEN: NAD EYE: Sclerae anicteric ENT: MMM CV: Non-tachycardic Pulm: CTA b/l GI: Soft, NT/ND NEURO:  Alert & Oriented x 3   Sandor Flatter, DO [facility]   09/19/2023 2:06 PM

## 2023-09-19 NOTE — Patient Instructions (Signed)

## 2023-09-19 NOTE — Progress Notes (Signed)
 Vss nad trans to pacu

## 2023-09-19 NOTE — Progress Notes (Signed)
 Vitals-JF  Pt's states no medical or surgical changes since previsit or office visit.

## 2023-09-19 NOTE — Progress Notes (Signed)
 Called to room to assist during endoscopic procedure.  Patient ID and intended procedure confirmed with present staff. Received instructions for my participation in the procedure from the performing physician.

## 2023-09-22 ENCOUNTER — Ambulatory Visit: Payer: Self-pay | Admitting: Gastroenterology

## 2023-09-22 ENCOUNTER — Telehealth: Payer: Self-pay

## 2023-09-22 NOTE — Telephone Encounter (Signed)
 Unable to reach pt.  Left HIPAA compliant voicemail.

## 2023-09-23 ENCOUNTER — Telehealth: Payer: Self-pay | Admitting: Gastroenterology

## 2023-09-23 NOTE — Telephone Encounter (Signed)
 Patient and his daughter Christian Crawford aware that new referral has been faxed to Dr Jonel office and they should hear about an appointment soon.

## 2023-09-23 NOTE — Telephone Encounter (Signed)
 Inbound call from patient daughter stating she contacted [facility] to get patient scheduled but was advised he needs a referral sent over. Please advise, thank you

## 2023-09-25 ENCOUNTER — Ambulatory Visit: Payer: Self-pay | Admitting: Gastroenterology

## 2023-09-25 LAB — SURGICAL PATHOLOGY

## 2023-09-30 ENCOUNTER — Telehealth: Payer: Self-pay | Admitting: Gastroenterology

## 2023-09-30 NOTE — Telephone Encounter (Signed)
 Called to [facility] and spoke with Burnard at Dr Jonel office.  Per Burnard, referral and notes were received at their office but no appointment has been made.  I made Burnard aware that the patient needs to be seen sooner rather than later, and she stated that she would need to send patient information to the physician for review.  [name], patient's daughter, notified of this information.  I advised [name] to make me aware if she has not heard from Dr Jonel office by the end of the week.  Kim agreed to plan and verbalized understanding.  No further questions.

## 2023-09-30 NOTE — Telephone Encounter (Signed)
 Inbound call from patients daughter Christian Crawford stated she has not received a call from [facility] Mishra's office to schedule appointment and would like to speak to someone in regards to that. Stated she's be in contact with Nat and was advised to call back after the holiday if she still hasn't heard back. Please advise  Thank you

## 2023-12-16 ENCOUNTER — Ambulatory Visit: Admission: EM | Admit: 2023-12-16 | Discharge: 2023-12-16 | Disposition: A | Discharge status: Home / Self Care

## 2023-12-16 DIAGNOSIS — R739 Hyperglycemia, unspecified: Secondary | ICD-10-CM

## 2023-12-16 LAB — GLUCOSE, POCT (MANUAL RESULT ENTRY): POCT Glucose (KUC): 342 mg/dL — AB (ref 70–99)

## 2023-12-16 NOTE — ED Provider Notes (Signed)
 [facility]  Note:  This document was prepared using Dragon voice recognition software and may include unintentional dictation errors.  MRN: [id number] DOB: 07-Jun-1933  Subjective:   Christian Crawford is a 89 y.o. male presenting for high blood glucose readings today at home.  Patient reports that around lunch she had a blood glucose of 330 mg/dL, 1 hour ago he rechecked his glucose 346 mg/dL glucose.  This prompted patient to be evaluated here in urgent care.  Patient brought by his daughter he states that he has been having difficulty regulating his blood glucose recently.  Patient takes glipizide 5 mg daily for glucose control.  Patient denies any chest pain, shortness of breath, weakness, dizziness, nausea/vomiting, dizziness, fatigue.  No current facility-administered medications for this encounter.  Current Outpatient Medications:    albuterol (VENTOLIN HFA) 108 (90 Base) MCG/ACT inhaler, Inhale 1-2 puffs into the lungs every 6 (six) hours as needed for wheezing or shortness of breath., Disp: , Rfl:    allopurinol  (ZYLOPRIM ) 100 MG tablet, Take 1 tablet by mouth 2 (two) times daily., Disp: , Rfl:    amLODipine  (NORVASC ) 10 MG tablet, Take 10 mg by mouth daily., Disp: , Rfl:    BOTOX 100 units SOLR injection, Inject 100 Units into the muscle See admin instructions. Inject 100 units into the eye muscle every 4 months., Disp: , Rfl:    glipiZIDE (GLUCOTROL XL) 5 MG 24 hr tablet, Take 5 mg by mouth daily., Disp: , Rfl:    latanoprost  (XALATAN ) 0.005 % ophthalmic solution, Place 1 drop into both eyes at bedtime., Disp: , Rfl:    losartan  (COZAAR ) 25 MG tablet, Take 25 mg by mouth daily., Disp: , Rfl:    metoprolol  tartrate (LOPRESSOR ) 50 MG tablet, Take 75 mg by mouth in the morning and at bedtime., Disp: , Rfl:    ondansetron  (ZOFRAN ) 4 MG tablet, Take 1 tablet (4 mg total) by mouth every 4 (four) hours as needed for nausea or vomiting., Disp: 60 tablet, Rfl: 3   warfarin  (COUMADIN ) 5 MG tablet, Take 1-1.5 tablets (5-7.5 mg total) by mouth See admin instructions. Take 7.5 mg (1.5 tablets) by mouth on Mon/Fri; take 5 mg (1 tablet) by mouth all other days (Sun, Tues, Wed, Thurs, Sat), Disp: , Rfl:    Allergies  Allergen Reactions   Lisinopril Cough    Past Medical History:  Diagnosis Date   A-fib ([facility])    Acute alcoholic pancreatitis 07/24/2019   Diverticulitis 11/13/2021   Heavy alcohol use 07/24/2019   Hematuria 11/14/2021   Hypertension    TIA (transient ischemic attack)      Past Surgical History:  Procedure Laterality Date   EYE SURGERY     had droopy eye    Family History  Problem Relation Age of Onset   Colon cancer Brother    Esophageal cancer Neg Hx    Liver disease Neg Hx     Social History   Tobacco Use   Smoking status: Former    Current packs/day: 2.50    Average packs/day: 2.5 packs/day for 40.0 years (100.0 ttl pk-yrs)    Types: Cigarettes   Smokeless tobacco: Never   Tobacco comments:    Pt stated has not smoke in 27 years  Vaping Use   Vaping status: Never Used  Substance Use Topics   Alcohol use: Not Currently    Comment: 1/4 pint a day-liquor.   Drug use: Never    ROS Refer to HPI for  ROS details.  Objective:   Vitals: BP (!) 164/79 (BP Location: Right Arm)   Pulse 74   Temp 98.2 F (36.8 C) (Oral)   Resp 16   SpO2 96%   Physical Exam Vitals and nursing note reviewed.  Constitutional:      General: He is not in acute distress.    Appearance: Normal appearance. He is well-developed. He is not ill-appearing or toxic-appearing.  HENT:     Head: Normocephalic.     Nose: Nose normal.     Mouth/Throat:     Mouth: Mucous membranes are moist.  Eyes:     General:        Right eye: No discharge.        Left eye: No discharge.     Extraocular Movements: Extraocular movements intact.     Conjunctiva/sclera: Conjunctivae normal.  Cardiovascular:     Rate and Rhythm: Normal rate.  Pulmonary:      Effort: Pulmonary effort is normal. No respiratory distress.     Breath sounds: No stridor. No wheezing.  Abdominal:     Palpations: Abdomen is soft.     Tenderness: There is no abdominal tenderness. There is no right CVA tenderness or left CVA tenderness.  Skin:    General: Skin is warm and dry.  Neurological:     General: No focal deficit present.     Mental Status: He is alert and oriented to person, place, and time.  Psychiatric:        Mood and Affect: Mood normal.        Behavior: Behavior normal.     Procedures  Results for orders placed or performed during the hospital encounter of 12/16/23 (from the past 24 hours)  POCT CBG (manual entry)     Status: Abnormal   Collection Time: 12/16/23  6:50 PM  Result Value Ref Range   POCT Glucose ([facility]) 342 (A) 70 - 99 mg/dL    No results found.   Assessment and Plan :     Discharge Instructions       1. Hyperglycemia (Primary) - POCT CBG completed in UC shows glucose as 342 mg/dL, slightly lower than 1 hour ago at home (346 mg/dL). -Repeat point-of-care glucose at home if blood glucose is greater than 380 mg/dL go to the emergency department for further evaluation. - Based on current blood glucose and current asymptomatic state, continue to monitor symptoms and blood glucose at home. - Drink plenty of water, eat low carbohydrate and low sugar diet, and monitor for any symptoms of hyperglycemia such as dizziness, increased urinary output, increased thirst, confusion, near syncope. - If you experience any escalation of current symptoms or development of new symptoms follow-up directly to the emergency department for further evaluation and management.      Christian Crawford Christian Crawford   Christian Crawford, [place], [place] 12/16/23 1914

## 2023-12-16 NOTE — ED Triage Notes (Signed)
 Pt presents due to blood sugar was in 200s. After taking medications and eating around 1 it was 339 About 1 hr ago was 346

## 2023-12-16 NOTE — Discharge Instructions (Addendum)
  1. Hyperglycemia (Primary) - POCT CBG completed in UC shows glucose as 342 mg/dL, slightly lower than 1 hour ago at home (346 mg/dL). -Repeat point-of-care glucose at home if blood glucose is greater than 380 mg/dL go to the emergency department for further evaluation. - Based on current blood glucose and current asymptomatic state, continue to monitor symptoms and blood glucose at home. - Drink plenty of water, eat low carbohydrate and low sugar diet, and monitor for any symptoms of hyperglycemia such as dizziness, increased urinary output, increased thirst, confusion, near syncope. - If you experience any escalation of current symptoms or development of new symptoms follow-up directly to the emergency department for further evaluation and management.

## 2023-12-30 ENCOUNTER — Emergency Department (HOSPITAL_COMMUNITY)
Admission: EM | Admit: 2023-12-30 | Discharge: 2023-12-30 | Disposition: A | Discharge status: Home / Self Care | Attending: Emergency Medicine | Admitting: Emergency Medicine

## 2023-12-30 ENCOUNTER — Other Ambulatory Visit: Payer: Self-pay

## 2023-12-30 ENCOUNTER — Encounter (HOSPITAL_COMMUNITY): Payer: Self-pay

## 2023-12-30 DIAGNOSIS — R739 Hyperglycemia, unspecified: Secondary | ICD-10-CM | POA: Diagnosis present

## 2023-12-30 LAB — COMPREHENSIVE METABOLIC PANEL WITH GFR
ALT: 50 U/L — ABNORMAL HIGH (ref 0–44)
AST: 40 U/L (ref 15–41)
Albumin: 4.1 g/dL (ref 3.5–5.0)
Alkaline Phosphatase: 166 U/L — ABNORMAL HIGH (ref 38–126)
Anion gap: 11 (ref 5–15)
BUN: 10 mg/dL (ref 8–23)
CO2: 24 mmol/L (ref 22–32)
Calcium: 9.5 mg/dL (ref 8.9–10.3)
Chloride: 99 mmol/L (ref 98–111)
Creatinine, Ser: 0.84 mg/dL (ref 0.61–1.24)
GFR, Estimated: >60 mL/min (ref >60)
Glucose, Bld: 420 mg/dL — ABNORMAL HIGH (ref 70–99)
Potassium: 4 mmol/L (ref 3.5–5.1)
Sodium: 134 mmol/L — ABNORMAL LOW (ref 135–145)
Total Bilirubin: 0.3 mg/dL (ref 0.0–1.2)
Total Protein: 7.2 g/dL (ref 6.5–8.1)

## 2023-12-30 LAB — CBC WITH DIFFERENTIAL/PLATELET
Abs Immature Granulocytes: 0.02 K/uL (ref 0.00–0.07)
Basophils Absolute: 0 K/uL (ref 0.0–0.1)
Basophils Relative: 1 %
Eosinophils Absolute: 0.6 K/uL — ABNORMAL HIGH (ref 0.0–0.5)
Eosinophils Relative: 6 %
HCT: 35.2 % — ABNORMAL LOW (ref 39.0–52.0)
Hemoglobin: 11.3 g/dL — ABNORMAL LOW (ref 13.0–17.0)
Immature Granulocytes: 0 %
Lymphocytes Relative: 26 %
Lymphs Abs: 2.3 K/uL (ref 0.7–4.0)
MCH: 27 pg (ref 26.0–34.0)
MCHC: 32.1 g/dL (ref 30.0–36.0)
MCV: 84.2 fL (ref 80.0–100.0)
Monocytes Absolute: 0.8 K/uL (ref 0.1–1.0)
Monocytes Relative: 9 %
Neutro Abs: 5.1 K/uL (ref 1.7–7.7)
Neutrophils Relative %: 58 %
Platelets: 340 K/uL (ref 150–400)
RBC: 4.18 MIL/uL — ABNORMAL LOW (ref 4.22–5.81)
RDW: 14.4 % (ref 11.5–15.5)
WBC: 8.8 K/uL (ref 4.0–10.5)
nRBC: 0 % (ref 0.0–0.2)

## 2023-12-30 LAB — CBG MONITORING, ED
Glucose-Capillary: 386 mg/dL — ABNORMAL HIGH (ref 70–99)
Glucose-Capillary: 236 mg/dL — ABNORMAL HIGH (ref 70–99)

## 2023-12-30 MED ORDER — INSULIN ASPART 100 UNIT/ML IJ SOLN
5.0000 [IU] | Freq: Once | INTRAMUSCULAR | Status: AC
  Administered 2023-12-30: 5 [IU] via SUBCUTANEOUS
  Filled 2023-12-30: qty 0.05

## 2023-12-30 MED ORDER — SODIUM CHLORIDE 0.9 % IV BOLUS
500.0000 mL | Freq: Once | INTRAVENOUS | Status: AC
  Administered 2023-12-30: 500 mL via INTRAVENOUS

## 2023-12-30 MED ORDER — GLIPIZIDE ER 5 MG PO TB24: 10.0000 mg | ORAL_TABLET | Freq: Every day | ORAL | 0 refills | Status: DC

## 2023-12-30 NOTE — Discharge Instructions (Addendum)
 I increased your medication glipizide to 10 mg once daily in the morning.  Please follow-up with your primary care doctor about your high blood sugars.

## 2023-12-30 NOTE — ED Provider Notes (Signed)
 [place] EMERGENCY DEPARTMENT AT [facility] Provider Note   CSN: [id number] Arrival date & time: 12/30/23  1754     Patient presents with: Hyperglycemia   Len Azeez is a 89 y.o. male here with high BS.  On Januvia and Glipizide (5 mg) but BS 300's at home the past month.  Eating low carb diet.  Denies headache, confusion, thirstiness or excessive urination.  Januvia is new medication.  A1c level reportedly around 9 according to daughter   HPI     Prior to Admission medications   Medication Sig Start Date End Date Taking? Authorizing Provider  albuterol (VENTOLIN HFA) 108 (90 Base) MCG/ACT inhaler Inhale 1-2 puffs into the lungs every 6 (six) hours as needed for wheezing or shortness of breath. 11/12/21   [provider]  allopurinol  (ZYLOPRIM ) 100 MG tablet Take 1 tablet by mouth 2 (two) times daily. 05/18/23   [provider]  amLODipine  (NORVASC ) 10 MG tablet Take 10 mg by mouth daily.    [provider]  BOTOX 100 units SOLR injection Inject 100 Units into the muscle See admin instructions. Inject 100 units into the eye muscle every 4 months.    [provider]  glipiZIDE (GLUCOTROL XL) 5 MG 24 hr tablet Take 2 tablets (10 mg total) by mouth daily with breakfast. 12/30/23 01/29/24  Cottie Donnice PARAS, MD  latanoprost  (XALATAN ) 0.005 % ophthalmic solution Place 1 drop into both eyes at bedtime. 06/10/19   [provider]  losartan  (COZAAR ) 25 MG tablet Take 25 mg by mouth daily.    [provider]  metoprolol  tartrate (LOPRESSOR ) 50 MG tablet Take 75 mg by mouth in the morning and at bedtime.    [provider]  ondansetron  (ZOFRAN ) 4 MG tablet Take 1 tablet (4 mg total) by mouth every 4 (four) hours as needed for nausea or vomiting. 04/11/23   Cirigliano, Vito V, DO  warfarin (COUMADIN ) 5 MG tablet Take 1-1.5 tablets (5-7.5 mg total) by mouth See admin instructions. Take 7.5 mg (1.5 tablets) by mouth on  Mon/Fri; take 5 mg (1 tablet) by mouth all other days (Sun, Tues, Wed, Thurs, Sat) 05/01/23   Christobal Guadalajara, MD    Allergies: Lisinopril    Review of Systems  Updated Vital Signs BP (!) 155/75   Pulse 67   Temp 98.2 F (36.8 C) (Oral)   Resp 18   SpO2 100%   Physical Exam Constitutional:      General: He is not in acute distress. HENT:     Head: Normocephalic and atraumatic.  Eyes:     Conjunctiva/sclera: Conjunctivae normal.     Pupils: Pupils are equal, round, and reactive to light.  Cardiovascular:     Rate and Rhythm: Normal rate and regular rhythm.  Pulmonary:     Effort: Pulmonary effort is normal. No respiratory distress.  Abdominal:     General: There is no distension.     Tenderness: There is no abdominal tenderness.  Skin:    General: Skin is warm and dry.  Neurological:     General: No focal deficit present.     Mental Status: He is alert. Mental status is at baseline.  Psychiatric:        Mood and Affect: Mood normal.        Behavior: Behavior normal.     (all labs ordered are listed, but only abnormal results are displayed) Labs Reviewed  CBC WITH DIFFERENTIAL/PLATELET - Abnormal; Notable for the following  components:      Result Value   RBC 4.18 (*)    Hemoglobin 11.3 (*)    HCT 35.2 (*)    Eosinophils Absolute 0.6 (*)    All other components within normal limits  COMPREHENSIVE METABOLIC PANEL WITH GFR - Abnormal; Notable for the following components:   Sodium 134 (*)    Glucose, Bld 420 (*)    ALT 50 (*)    Alkaline Phosphatase 166 (*)    All other components within normal limits  CBG MONITORING, ED - Abnormal; Notable for the following components:   Glucose-Capillary 386 (*)    All other components within normal limits  CBG MONITORING, ED - Abnormal; Notable for the following components:   Glucose-Capillary 236 (*)    All other components within normal limits    EKG: None  Radiology: No results found.   Procedures   Medications  Ordered in the ED  sodium chloride  0.9 % bolus 500 mL (500 mLs Intravenous New Bag/Given 12/30/23 2215)  insulin  aspart (novoLOG ) injection 5 Units (5 Units Subcutaneous Given 12/30/23 2219)    Clinical Course as of 12/30/23 2319  Tue Dec 30, 2023  2301 BS 236 after treatment - okay for discharge [MT]    Clinical Course User Index [MT] Keeghan Bialy, Donnice PARAS, MD                                 Medical Decision Making Amount and/or Complexity of Data Reviewed Labs: ordered.  Risk Prescription drug management.   Asymptomatic hyperglycemia Likely diabetes type 2 Can increase home glipizide to 10 mg XR daily and continue januvia  No evidence of DKA, infection, stroke  Given IV fluids and 5 units insulin  in ED with improved BS No indication for hospitalization at this time Advised PCP follow up      Final diagnoses:  Hyperglycemia    ED Discharge Orders          Ordered    glipiZIDE (GLUCOTROL XL) 5 MG 24 hr tablet  Daily with breakfast        12/30/23 2300               Cottie Donnice PARAS, MD 12/30/23 2319

## 2023-12-30 NOTE — ED Triage Notes (Signed)
 Pt reports that his blood sugar was 392 at home. Pt denies symptoms.

## 2023-12-31 ENCOUNTER — Telehealth: Payer: Self-pay | Admitting: Gastroenterology

## 2023-12-31 NOTE — Telephone Encounter (Signed)
 Inbound call from daughter advising patient was seen  in the ED yesterday. Primary care requesting a sooner apt with provider. Daughter also states patient blood sugar is over 400.   Scheduled for next available. Please advise.

## 2023-12-31 NOTE — Telephone Encounter (Signed)
 Spoke with Christian Crawford. Patient will need to follow up with PCP regarding hyperglycemia and ER visit.

## 2024-01-07 ENCOUNTER — Ambulatory Visit: Admitting: Physician Assistant

## 2024-01-30 ENCOUNTER — Emergency Department (HOSPITAL_COMMUNITY)
Admission: EM | Admit: 2024-01-30 | Discharge: 2024-01-30 | Disposition: A | Discharge status: Home / Self Care | Attending: Emergency Medicine | Admitting: Emergency Medicine

## 2024-01-30 DIAGNOSIS — R739 Hyperglycemia, unspecified: Secondary | ICD-10-CM

## 2024-01-30 DIAGNOSIS — Z79899 Other long term (current) drug therapy: Secondary | ICD-10-CM | POA: Insufficient documentation

## 2024-01-30 DIAGNOSIS — E1165 Type 2 diabetes mellitus with hyperglycemia: Secondary | ICD-10-CM | POA: Insufficient documentation

## 2024-01-30 DIAGNOSIS — I10 Essential (primary) hypertension: Secondary | ICD-10-CM | POA: Insufficient documentation

## 2024-01-30 DIAGNOSIS — Z7901 Long term (current) use of anticoagulants: Secondary | ICD-10-CM | POA: Diagnosis not present

## 2024-01-30 DIAGNOSIS — E871 Hypo-osmolality and hyponatremia: Secondary | ICD-10-CM | POA: Insufficient documentation

## 2024-01-30 DIAGNOSIS — Z7984 Long term (current) use of oral hypoglycemic drugs: Secondary | ICD-10-CM | POA: Diagnosis not present

## 2024-01-30 LAB — URINALYSIS, ROUTINE W REFLEX MICROSCOPIC
Bacteria, UA: NONE SEEN
Bilirubin Urine: NEGATIVE
Glucose, UA: >=500 mg/dL — AB
Hgb urine dipstick: NEGATIVE
Ketones, ur: NEGATIVE mg/dL
Leukocytes,Ua: NEGATIVE
Nitrite: NEGATIVE
Protein, ur: NEGATIVE mg/dL
Specific Gravity, Urine: 1.012 (ref 1.005–1.030)
pH: 6 (ref 5.0–8.0)

## 2024-01-30 LAB — CBG MONITORING, ED
Glucose-Capillary: 366 mg/dL — ABNORMAL HIGH (ref 70–99)
Glucose-Capillary: 316 mg/dL — ABNORMAL HIGH (ref 70–99)
Glucose-Capillary: 193 mg/dL — ABNORMAL HIGH (ref 70–99)

## 2024-01-30 LAB — COMPREHENSIVE METABOLIC PANEL WITH GFR
ALT: 32 U/L (ref 0–44)
AST: 33 U/L (ref 15–41)
Albumin: 4 g/dL (ref 3.5–5.0)
Alkaline Phosphatase: 292 U/L — ABNORMAL HIGH (ref 38–126)
Anion gap: 9 (ref 5–15)
BUN: 11 mg/dL (ref 8–23)
CO2: 25 mmol/L (ref 22–32)
Calcium: 9.3 mg/dL (ref 8.9–10.3)
Chloride: 99 mmol/L (ref 98–111)
Creatinine, Ser: 0.81 mg/dL (ref 0.61–1.24)
GFR, Estimated: >60 mL/min (ref >60)
Glucose, Bld: 386 mg/dL — ABNORMAL HIGH (ref 70–99)
Potassium: 3.9 mmol/L (ref 3.5–5.1)
Sodium: 134 mmol/L — ABNORMAL LOW (ref 135–145)
Total Bilirubin: 0.3 mg/dL (ref 0.0–1.2)
Total Protein: 7.2 g/dL (ref 6.5–8.1)

## 2024-01-30 LAB — I-STAT CHEM 8, ED
BUN: 11 mg/dL (ref 8–23)
Calcium, Ion: 1.21 mmol/L (ref 1.15–1.40)
Chloride: 98 mmol/L (ref 98–111)
Creatinine, Ser: 0.7 mg/dL (ref 0.61–1.24)
Glucose, Bld: 375 mg/dL — ABNORMAL HIGH (ref 70–99)
HCT: 37 % — ABNORMAL LOW (ref 39.0–52.0)
Hemoglobin: 12.6 g/dL — ABNORMAL LOW (ref 13.0–17.0)
Potassium: 3.9 mmol/L (ref 3.5–5.1)
Sodium: 135 mmol/L (ref 135–145)
TCO2: 24 mmol/L (ref 22–32)

## 2024-01-30 LAB — CBC
HCT: 37.3 % — ABNORMAL LOW (ref 39.0–52.0)
Hemoglobin: 11.3 g/dL — ABNORMAL LOW (ref 13.0–17.0)
MCH: 25.6 pg — ABNORMAL LOW (ref 26.0–34.0)
MCHC: 30.3 g/dL (ref 30.0–36.0)
MCV: 84.4 fL (ref 80.0–100.0)
Platelets: 359 K/uL (ref 150–400)
RBC: 4.42 MIL/uL (ref 4.22–5.81)
RDW: 13.8 % (ref 11.5–15.5)
WBC: 8.9 K/uL (ref 4.0–10.5)
nRBC: 0 % (ref 0.0–0.2)

## 2024-01-30 MED ORDER — GLIPIZIDE ER 5 MG PO TB24: 15.0000 mg | ORAL_TABLET | Freq: Every day | ORAL | 0 refills | Status: DC

## 2024-01-30 MED ORDER — SODIUM CHLORIDE 0.9 % IV BOLUS
500.0000 mL | Freq: Once | INTRAVENOUS | Status: AC
  Administered 2024-01-30: 500 mL via INTRAVENOUS

## 2024-01-30 MED ORDER — INSULIN ASPART 100 UNIT/ML IJ SOLN
5.0000 [IU] | Freq: Once | INTRAMUSCULAR | Status: AC
  Administered 2024-01-30: 5 [IU] via SUBCUTANEOUS
  Filled 2024-01-30: qty 5

## 2024-01-30 NOTE — Discharge Instructions (Signed)
 As discussed, please follow-up with your GI provider early next week.  Please also follow-up with your primary care provider.  Seek emergency care if experiencing any new or worsening symptoms.

## 2024-01-30 NOTE — ED Triage Notes (Signed)
 Type 2 diabetic - CBG at home was 456. Started Januvia over a month ago, but CBG has not been stable since then. Patient does not have any changes to his symptoms. Does not notice his CBG is high. Has an appt with GI next Tuesday to check his Pancreas. Has times where his CBG is up and down.

## 2024-01-30 NOTE — ED Provider Notes (Signed)
 [place] EMERGENCY DEPARTMENT AT [facility] Provider Note   CSN: [id number] Arrival date & time: 01/30/24  1548     Patient presents with: Hyperglycemia   Christian Crawford is a 89 y.o. male with PMHx chronic pancreatitis, HTN, DM, PAF, who presents to the ED concerned for asymptomatic hyperglycemia. Patient seen in ED last month for same symptoms and his glipizide dose was increased. Patient's family member at bedside stating that his blood sugars continue to be in the 300's at home. Patient endorsing blood sugar in the 400's earlier today. Patient with appointment with GI provider this Tuesday because PCP believes that his difficulty controlling his DM could be related to his chronic pancreas problems. Patient endorses compliance with his home DM medications. Patient stating that he will intermittently have increased urination frequency, but denies any other urinary symptoms today.  Family member denying any confusion.  There patient denies any fevers, chest pain, shortness of breath, nausea, vomiting, diarrhea.    Hyperglycemia      Prior to Admission medications   Medication Sig Start Date End Date Taking? Authorizing Provider  albuterol (VENTOLIN HFA) 108 (90 Base) MCG/ACT inhaler Inhale 1-2 puffs into the lungs every 6 (six) hours as needed for wheezing or shortness of breath. 11/12/21   [provider]  allopurinol  (ZYLOPRIM ) 100 MG tablet Take 1 tablet by mouth 2 (two) times daily. 05/18/23   [provider]  amLODipine  (NORVASC ) 10 MG tablet Take 10 mg by mouth daily.    [provider]  BOTOX 100 units SOLR injection Inject 100 Units into the muscle See admin instructions. Inject 100 units into the eye muscle every 4 months.    [provider]  glipiZIDE (GLUCOTROL XL) 5 MG 24 hr tablet Take 2 tablets (10 mg total) by mouth daily with breakfast. 12/30/23 01/29/24  Cottie Donnice PARAS, MD  latanoprost  (XALATAN ) 0.005 % ophthalmic solution  Place 1 drop into both eyes at bedtime. 06/10/19   [provider]  losartan  (COZAAR ) 25 MG tablet Take 25 mg by mouth daily.    [provider]  metoprolol  tartrate (LOPRESSOR ) 50 MG tablet Take 75 mg by mouth in the morning and at bedtime.    [provider]  ondansetron  (ZOFRAN ) 4 MG tablet Take 1 tablet (4 mg total) by mouth every 4 (four) hours as needed for nausea or vomiting. 04/11/23   Cirigliano, Vito V, DO  warfarin (COUMADIN ) 5 MG tablet Take 1-1.5 tablets (5-7.5 mg total) by mouth See admin instructions. Take 7.5 mg (1.5 tablets) by mouth on Mon/Fri; take 5 mg (1 tablet) by mouth all other days (Sun, Tues, Wed, Thurs, Sat) 05/01/23   Christobal Guadalajara, MD    Allergies: Lisinopril    Review of Systems  Constitutional:        Hyperglycemia    Updated Vital Signs BP (!) 149/67   Pulse 64   Temp 98.2 F (36.8 C) (Oral)   Resp 18   SpO2 100%   Physical Exam Vitals and nursing note reviewed.  Constitutional:      General: He is not in acute distress.    Appearance: He is not ill-appearing or toxic-appearing.  HENT:     Head: Normocephalic and atraumatic.     Mouth/Throat:     Mouth: Mucous membranes are moist.  Eyes:     General: No scleral icterus.       Right eye: No discharge.        Left eye: No discharge.  Conjunctiva/sclera: Conjunctivae normal.  Cardiovascular:     Rate and Rhythm: Normal rate and regular rhythm.     Pulses: Normal pulses.     Heart sounds: Normal heart sounds. No murmur heard. Pulmonary:     Effort: Pulmonary effort is normal. No respiratory distress.     Breath sounds: Normal breath sounds. No wheezing, rhonchi or rales.  Abdominal:     General: Abdomen is flat. Bowel sounds are normal. There is no distension.     Palpations: Abdomen is soft. There is no mass.     Tenderness: There is no abdominal tenderness.  Musculoskeletal:     Right lower leg: No edema.     Left lower leg: No edema.  Skin:    General: Skin is  warm and dry.     Findings: No rash.  Neurological:     General: No focal deficit present.     Mental Status: He is alert and oriented to person, place, and time. Mental status is at baseline.  Psychiatric:        Mood and Affect: Mood normal.        Behavior: Behavior normal.     (all labs ordered are listed, but only abnormal results are displayed) Labs Reviewed  CBC - Abnormal; Notable for the following components:      Result Value   Hemoglobin 11.3 (*)    HCT 37.3 (*)    MCH 25.6 (*)    All other components within normal limits  URINALYSIS, ROUTINE W REFLEX MICROSCOPIC - Abnormal; Notable for the following components:   Color, Urine STRAW (*)    Glucose, UA >=500 (*)    All other components within normal limits  COMPREHENSIVE METABOLIC PANEL WITH GFR - Abnormal; Notable for the following components:   Sodium 134 (*)    Glucose, Bld 386 (*)    Alkaline Phosphatase 292 (*)    All other components within normal limits  CBG MONITORING, ED - Abnormal; Notable for the following components:   Glucose-Capillary 366 (*)    All other components within normal limits  I-STAT CHEM 8, ED - Abnormal; Notable for the following components:   Glucose, Bld 375 (*)    Hemoglobin 12.6 (*)    HCT 37.0 (*)    All other components within normal limits  CBG MONITORING, ED - Abnormal; Notable for the following components:   Glucose-Capillary 316 (*)    All other components within normal limits    EKG: None  Radiology: No results found.   Procedures   Medications Ordered in the ED  sodium chloride  0.9 % bolus 500 mL (0 mLs Intravenous Stopped 01/30/24 1745)  insulin  aspart (novoLOG ) injection 5 Units (5 Units Subcutaneous Given 01/30/24 1644)                                    Medical Decision Making Amount and/or Complexity of Data Reviewed Labs: ordered.  Risk Prescription drug management.   This patient presents to the ED for concern of hyperglycemia, this involves an  extensive number of treatment options, and is a complaint that carries with it a high risk of complications and morbidity.  The differential diagnosis includes AMS, DKA, HHS, etc.   Co morbidities that complicate the patient evaluation  chronic pancreatitis, HTN, DM, PAF   Additional history obtained:  Additional history obtained from 10/7 ED note: patient seen for same complaint. Patient with  reassuring workup and discharged in safe condition.    Problem List / ED Course / Critical interventions / Medication management  Patient presents to ED concern for hypoglycemia.  Patient endorsing intermittent urinary frequency but denies any acute symptoms currently.  Physical exam reassuring.  Patient afebrile with stable vitals. I Ordered, and personally interpreted labs.  UA not concerning for infection.  CBC without leukocytosis.  There is mild anemia with hemoglobin 11.3.  CMP with mild hyponatremia 134.  ALP elevated at 292.  CBG initially 366 downtrending to 193 after IV fluids and 5 units insulin .  It does not appear that patient is in DKA or having any other emergent process occurring at this time. Patient will be following up with his GI provider this next Tuesday. His GI provider seems to be appropriate to also help patient follow up with his elevated ALP level.  Will increase patient's dose of glipizide from 10mg  to 15mg  ER daily.  Patient to continue checking blood sugar levels and also follow-up with PCP.  Patient agreeable to plan and is ready to go home. Staffed with Dr. Cottie who agrees with plan. I have reviewed the patients home medicines and have made adjustments as needed The patient has been appropriately medically screened and/or stabilized in the ED. I have low suspicion for any other emergent medical condition which would require further screening, evaluation or treatment in the ED or require inpatient management. At time of discharge the patient is hemodynamically stable and  in no acute distress. I have discussed work-up results and diagnosis with patient and answered all questions. Patient is agreeable with discharge plan. We discussed strict return precautions for returning to the emergency department and they verbalized understanding.     Social Determinants of Health:  geriatric      Final diagnoses:  Hyperglycemia    ED Discharge Orders     None          Hoy Nidia FALCON, [place] 01/30/24 1917    Cottie Donnice PARAS, MD 01/30/24 2111

## 2024-02-23 ENCOUNTER — Emergency Department (HOSPITAL_COMMUNITY)

## 2024-02-23 ENCOUNTER — Other Ambulatory Visit: Payer: Self-pay

## 2024-02-23 ENCOUNTER — Emergency Department (HOSPITAL_COMMUNITY): Admission: EM | Admit: 2024-02-23 | Discharge: 2024-02-24 | Disposition: A

## 2024-02-23 DIAGNOSIS — I482 Chronic atrial fibrillation, unspecified: Secondary | ICD-10-CM | POA: Insufficient documentation

## 2024-02-23 DIAGNOSIS — J449 Chronic obstructive pulmonary disease, unspecified: Secondary | ICD-10-CM | POA: Diagnosis not present

## 2024-02-23 DIAGNOSIS — Z7984 Long term (current) use of oral hypoglycemic drugs: Secondary | ICD-10-CM | POA: Diagnosis not present

## 2024-02-23 DIAGNOSIS — Z7901 Long term (current) use of anticoagulants: Secondary | ICD-10-CM | POA: Insufficient documentation

## 2024-02-23 DIAGNOSIS — R748 Abnormal levels of other serum enzymes: Secondary | ICD-10-CM | POA: Insufficient documentation

## 2024-02-23 DIAGNOSIS — R739 Hyperglycemia, unspecified: Secondary | ICD-10-CM

## 2024-02-23 DIAGNOSIS — Z79899 Other long term (current) drug therapy: Secondary | ICD-10-CM | POA: Diagnosis not present

## 2024-02-23 DIAGNOSIS — I10 Essential (primary) hypertension: Secondary | ICD-10-CM | POA: Insufficient documentation

## 2024-02-23 DIAGNOSIS — Z7951 Long term (current) use of inhaled steroids: Secondary | ICD-10-CM | POA: Diagnosis not present

## 2024-02-23 DIAGNOSIS — E1165 Type 2 diabetes mellitus with hyperglycemia: Secondary | ICD-10-CM | POA: Insufficient documentation

## 2024-02-23 LAB — CBC
HCT: 35.1 % — ABNORMAL LOW (ref 39.0–52.0)
Hemoglobin: 11.3 g/dL — ABNORMAL LOW (ref 13.0–17.0)
MCH: 26.6 pg (ref 26.0–34.0)
MCHC: 32.2 g/dL (ref 30.0–36.0)
MCV: 82.6 fL (ref 80.0–100.0)
Platelets: 510 K/uL — ABNORMAL HIGH (ref 150–400)
RBC: 4.25 MIL/uL (ref 4.22–5.81)
RDW: 14.1 % (ref 11.5–15.5)
WBC: 10.2 K/uL (ref 4.0–10.5)
nRBC: 0 % (ref 0.0–0.2)

## 2024-02-23 LAB — COMPREHENSIVE METABOLIC PANEL WITH GFR
ALT: 454 U/L — ABNORMAL HIGH (ref 0–44)
AST: 357 U/L — ABNORMAL HIGH (ref 15–41)
Albumin: 3.8 g/dL (ref 3.5–5.0)
Alkaline Phosphatase: 932 U/L — ABNORMAL HIGH (ref 38–126)
Anion gap: 10 (ref 5–15)
BUN: 15 mg/dL (ref 8–23)
CO2: 23 mmol/L (ref 22–32)
Calcium: 9.4 mg/dL (ref 8.9–10.3)
Chloride: 98 mmol/L (ref 98–111)
Creatinine, Ser: 0.8 mg/dL (ref 0.61–1.24)
GFR, Estimated: >60 mL/min (ref >60)
Glucose, Bld: 446 mg/dL — ABNORMAL HIGH (ref 70–99)
Potassium: 4.5 mmol/L (ref 3.5–5.1)
Sodium: 132 mmol/L — ABNORMAL LOW (ref 135–145)
Total Bilirubin: 0.4 mg/dL (ref 0.0–1.2)
Total Protein: 6.8 g/dL (ref 6.5–8.1)

## 2024-02-23 LAB — URINALYSIS, ROUTINE W REFLEX MICROSCOPIC
Bacteria, UA: NONE SEEN
Bilirubin Urine: NEGATIVE
Glucose, UA: >=500 mg/dL — AB
Hgb urine dipstick: NEGATIVE
Ketones, ur: NEGATIVE mg/dL
Leukocytes,Ua: NEGATIVE
Nitrite: NEGATIVE
Protein, ur: NEGATIVE mg/dL
Specific Gravity, Urine: 1.025 (ref 1.005–1.030)
pH: 6 (ref 5.0–8.0)

## 2024-02-23 LAB — APTT: aPTT: 43 s — ABNORMAL HIGH (ref 24–36)

## 2024-02-23 LAB — PROTIME-INR
INR: 2.5 — ABNORMAL HIGH (ref 0.8–1.2)
Prothrombin Time: 28.5 s — ABNORMAL HIGH (ref 11.4–15.2)

## 2024-02-23 LAB — CBG MONITORING, ED
Glucose-Capillary: 185 mg/dL — ABNORMAL HIGH (ref 70–99)
Glucose-Capillary: 261 mg/dL — ABNORMAL HIGH (ref 70–99)
Glucose-Capillary: 401 mg/dL — ABNORMAL HIGH (ref 70–99)

## 2024-02-23 LAB — LIPASE, BLOOD: Lipase: 32 U/L (ref 11–51)

## 2024-02-23 MED ORDER — SODIUM CHLORIDE 0.9 % IV BOLUS
1000.0000 mL | Freq: Once | INTRAVENOUS | Status: AC
  Administered 2024-02-23: 1000 mL via INTRAVENOUS

## 2024-02-23 MED ORDER — INSULIN ASPART 100 UNIT/ML IJ SOLN
5.0000 [IU] | Freq: Once | INTRAMUSCULAR | Status: AC
  Administered 2024-02-23: 5 [IU] via SUBCUTANEOUS
  Filled 2024-02-23: qty 5

## 2024-02-23 MED ORDER — INSULIN ASPART PROT & ASPART (70-30 MIX) 100 UNIT/ML ~~LOC~~ SUSP: 5.0000 [IU] | Freq: Once | SUBCUTANEOUS | Status: DC

## 2024-02-23 NOTE — ED Provider Notes (Signed)
 [place] EMERGENCY DEPARTMENT AT [facility] Provider Note   CSN: [id number] Arrival date & time: 02/23/24  1933     Patient presents with: Hyperglycemia   Christian Crawford is a 89 y.o. male.  89 year old male presents ED with complaints of hyperglycemia x 1 day.  Patient has a significant history of type 2 diabetes, chronic pancreatitis, COPD, A-fib controlled with warfarin, hypertension, diverticulitis, and history of alcohol abuse (quit several years ago ).  Patient reports he has had some mild abdominal pain and reported sluggishness over the last day or 2.  Today is the first day he has noted elevated glucose.  Last time he was in the ED he was increased on his glipizide  to 3 times per day.  He does have a GI appointment December 11 for management of chronic pancreatitis and diabetes.  Patient reports being compliant with medication.  Patient denies dizziness, nausea, vomiting, chest pain, extremity swelling, or syncope.     Prior to Admission medications   Medication Sig Start Date End Date Taking? Authorizing Provider  albuterol (VENTOLIN HFA) 108 (90 Base) MCG/ACT inhaler Inhale 1-2 puffs into the lungs every 6 (six) hours as needed for wheezing or shortness of breath. 11/12/21   [provider]  allopurinol  (ZYLOPRIM ) 100 MG tablet Take 1 tablet by mouth 2 (two) times daily. 05/18/23   [provider]  amLODipine  (NORVASC ) 10 MG tablet Take 10 mg by mouth daily.    [provider]  BOTOX 100 units SOLR injection Inject 100 Units into the muscle See admin instructions. Inject 100 units into the eye muscle every 4 months.    [provider]  glipiZIDE  (GLUCOTROL  XL) 5 MG 24 hr tablet Take 3 tablets (15 mg total) by mouth daily with breakfast. 01/30/24 02/29/24  Hoy Nidia FALCON, PA-C  latanoprost  (XALATAN ) 0.005 % ophthalmic solution Place 1 drop into both eyes at bedtime. 06/10/19   [provider]  losartan  (COZAAR ) 25 MG  tablet Take 25 mg by mouth daily.    [provider]  metoprolol  tartrate (LOPRESSOR ) 50 MG tablet Take 75 mg by mouth in the morning and at bedtime.    [provider]  ondansetron  (ZOFRAN ) 4 MG tablet Take 1 tablet (4 mg total) by mouth every 4 (four) hours as needed for nausea or vomiting. 04/11/23   Cirigliano, Vito V, DO  warfarin (COUMADIN ) 5 MG tablet Take 1-1.5 tablets (5-7.5 mg total) by mouth See admin instructions. Take 7.5 mg (1.5 tablets) by mouth on Mon/Fri; take 5 mg (1 tablet) by mouth all other days (Sun, Tues, Wed, [place], Sat) 05/01/23   Christobal Guadalajara, MD    Allergies: Lisinopril    Review of Systems  Constitutional:  Positive for fatigue.  Gastrointestinal:  Positive for abdominal pain.  All other systems reviewed and are negative.   Updated Vital Signs BP 129/67 (BP Location: Left Arm)   Pulse 85   Temp 98.2 F (36.8 C) (Oral)   Resp 18   SpO2 100%   Physical Exam Vitals and nursing note reviewed.  Constitutional:      Appearance: Normal appearance.  HENT:     Head: Normocephalic and atraumatic.     Nose: Nose normal.  Eyes:     Extraocular Movements: Extraocular movements intact.     Conjunctiva/sclera: Conjunctivae normal.     Pupils: Pupils are equal, round, and reactive to light.  Cardiovascular:     Rate and Rhythm: Normal rate.  Pulmonary:  Effort: Pulmonary effort is normal. No respiratory distress.     Breath sounds: Normal breath sounds.  Abdominal:     General: Abdomen is flat.     Palpations: Abdomen is soft.     Tenderness: There is abdominal tenderness. There is no guarding.     Comments: On deep palpation throughout abdomen patient reported some mild discomfort.  Negative Murphy's and negative McBurney's.  Musculoskeletal:        General: Normal range of motion.     Cervical back: Normal range of motion.  Skin:    General: Skin is warm.     Capillary Refill: Capillary refill takes less than 2 seconds.  Neurological:      General: No focal deficit present.     Mental Status: He is alert.  Psychiatric:        Mood and Affect: Mood normal.        Behavior: Behavior normal.     (all labs ordered are listed, but only abnormal results are displayed) Labs Reviewed  CBC - Abnormal; Notable for the following components:      Result Value   Hemoglobin 11.3 (*)    HCT 35.1 (*)    Platelets 510 (*)    All other components within normal limits  URINALYSIS, ROUTINE W REFLEX MICROSCOPIC - Abnormal; Notable for the following components:   Glucose, UA >=500 (*)    All other components within normal limits  COMPREHENSIVE METABOLIC PANEL WITH GFR - Abnormal; Notable for the following components:   Sodium 132 (*)    Glucose, Bld 446 (*)    AST 357 (*)    ALT 454 (*)    Alkaline Phosphatase 932 (*)    All other components within normal limits  PROTIME-INR - Abnormal; Notable for the following components:   Prothrombin Time 28.5 (*)    INR 2.5 (*)    All other components within normal limits  APTT - Abnormal; Notable for the following components:   aPTT 43 (*)    All other components within normal limits  CBG MONITORING, ED - Abnormal; Notable for the following components:   Glucose-Capillary 401 (*)    All other components within normal limits  CBG MONITORING, ED - Abnormal; Notable for the following components:   Glucose-Capillary 261 (*)    All other components within normal limits  CBG MONITORING, ED - Abnormal; Notable for the following components:   Glucose-Capillary 185 (*)    All other components within normal limits  LIPASE, BLOOD  CBG MONITORING, ED    EKG: None  Radiology: US  Abdomen Limited RUQ (LIVER/GB) Result Date: 02/23/2024 CLINICAL DATA:  Right upper quadrant pain x3 weeks. EXAM: ULTRASOUND ABDOMEN LIMITED RIGHT UPPER QUADRANT COMPARISON:  April 22, 2023 FINDINGS: Gallbladder: No gallstones or wall thickening visualized (2.3 mm). No sonographic Murphy sign noted by sonographer.  Common bile duct: Diameter: 16.7 mm Liver: 1.5 cm x 1.1 cm x 1.5 cm and 1.7 cm x 1.0 cm x 1.5 cm well-defined areas of heterogeneous hypoechogenicity are seen within the left lobe of the liver. A similar appearing 0.7 cm x 1.2 cm x 1.3 cm hypoechoic area is seen within the right lobe of the liver. There is diffusely increased echogenicity of the liver parenchyma. Portal vein is patent on color Doppler imaging with normal direction of blood flow towards the liver. Other: A 4.7 cm x 3.5 cm x 3.6 cm heterogeneous, mass-like area is seen within the region of the pancreatic head. Amount of perihepatic  ascites is noted. IMPRESSION: 1. Multiple heterogeneous low-attenuation liver lesions. While these may represent complex hepatic cysts sys hemangiomas, sequelae associated with an underlying neoplastic process cannot be excluded. Nonemergent MRI correlation is recommended. 2. Large mass-like area within the region of the pancreatic head which may represent a large pancreatic mass. Nonemergent MRI correlation is recommended, as sequelae associated with an underlying neoplasm cannot be excluded. 3. Hepatic steatosis. 4. Ascites. Electronically Signed   By: Suzen Dials M.D.   On: 02/23/2024 22:26     Procedures   Medications Ordered in the ED  sodium chloride  0.9 % bolus 1,000 mL (0 mLs Intravenous Stopped 02/23/24 2357)  insulin  aspart (novoLOG ) injection 5 Units (5 Units Subcutaneous Given 02/23/24 2118)    89 y.o. male presents to the ED with complaints of hyperglycemia with mild abdominal pain and associated general fatigue, The differential diagnosis includes but is not limited to hyperglycemia, DKA, pancreatitis, cholecystitis, colitis, other viral illness, (Ddx)  On arrival pt is nontoxic, vitals unremarkable. Exam significant for mild pain to palpation throughout abdomen with deep palpation.  I ordered medication and insulin  aspart for hyperglycemia.  Lab Tests:  I Ordered, reviewed, and  interpreted labs, which included: CMP, CBC, PT/INR, APTT, UA, CBG, lipase.  Liver enzymes noted to be elevated significantly increased from 3 weeks ago.  Lipase unremarkable and PT/INR and APTT within appropriate range.  Hemoglobin decreased near patient's baseline and improved from several months ago.  Imaging Studies ordered:  I ordered imaging studies which included right upper quadrant ultrasound.  Ultrasound noted multiple liver lesions, hepatic cyst, and large mass structure in pancreatic head, hepatic steatosis, and ascites.  With multiple lesions and large masslike structure within the pancreatic head it was recommended for nonemergent MRI.  ED Course:   Ultrasound results were discussed with patient and family at bedside.  They report he has had a biopsy of the pancreatic mass and several scans but is being scheduled for further imaging for monitoring and further evaluation of mass.  He was advised to follow-up outpatient with MRI.  Family agreed for outpatient imaging.  Family advised they were advised by PCP to go to the emergency room if glucose elevated above 380 and PCP we will not prescribe insulin  until patient is seen by GI on 11th of December.  Family was advised of the limitations of the diabetic management from the ED and recommended to follow-up with PCP this week to establish further imaging and get further recommendation of glucose control.  Family agreed with treatment plan and was comfortable discharge at this time.   Portions of this note were generated with Scientist, clinical (histocompatibility and immunogenetics). Dictation errors may occur despite best attempts at proofreading.   Final diagnoses:  Hyperglycemia    ED Discharge Orders     None

## 2024-02-23 NOTE — Discharge Instructions (Addendum)
 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  It is recommended to get MRI in the outpatient setting for further evaluation of lesions.  Lab work today suggestive for high blood glucose. Please continue to take medication as prescribed by PCP.  It is recommended to follow-up with PCP to discuss ED visit and further recommendations for glucose control. If you have new or concerning symptoms please return to ED for further evaluation.

## 2024-02-23 NOTE — ED Provider Notes (Incomplete)
 [place] EMERGENCY DEPARTMENT AT [facility] Provider Note   CSN: [id number] Arrival date & time: 02/23/24  1933     Patient presents with: Hyperglycemia   Christian Crawford is a 89 y.o. male.  89 year old male presents ED with complaints of hyperglycemia x 1 day.  Patient has a significant history of type 2 diabetes, chronic pancreatitis, COPD, A-fib controlled with warfarin, hypertension, diverticulitis, and history of alcohol abuse (quit several years ago ).  Patient reports he has had some mild abdominal pain and reported sluggishness over the last day or 2.  Today is the first day he has noted elevated glucose.  Last time he was in the ED he was increased on his glipizide  to 3 times per day.  He does have a GI appointment December 11 for management of chronic pancreatitis and diabetes.  Patient reports being compliant with medication.  Patient denies dizziness, nausea, vomiting, chest pain, extremity swelling, or syncope.     Prior to Admission medications   Medication Sig Start Date End Date Taking? Authorizing Provider  albuterol (VENTOLIN HFA) 108 (90 Base) MCG/ACT inhaler Inhale 1-2 puffs into the lungs every 6 (six) hours as needed for wheezing or shortness of breath. 11/12/21   [provider]  allopurinol  (ZYLOPRIM ) 100 MG tablet Take 1 tablet by mouth 2 (two) times daily. 05/18/23   [provider]  amLODipine  (NORVASC ) 10 MG tablet Take 10 mg by mouth daily.    [provider]  BOTOX 100 units SOLR injection Inject 100 Units into the muscle See admin instructions. Inject 100 units into the eye muscle every 4 months.    [provider]  glipiZIDE  (GLUCOTROL  XL) 5 MG 24 hr tablet Take 3 tablets (15 mg total) by mouth daily with breakfast. 01/30/24 02/29/24  Hoy Nidia FALCON, PA-C  latanoprost  (XALATAN ) 0.005 % ophthalmic solution Place 1 drop into both eyes at bedtime. 06/10/19   [provider]  losartan  (COZAAR ) 25 MG  tablet Take 25 mg by mouth daily.    [provider]  metoprolol  tartrate (LOPRESSOR ) 50 MG tablet Take 75 mg by mouth in the morning and at bedtime.    [provider]  ondansetron  (ZOFRAN ) 4 MG tablet Take 1 tablet (4 mg total) by mouth every 4 (four) hours as needed for nausea or vomiting. 04/11/23   Cirigliano, Vito V, DO  warfarin (COUMADIN ) 5 MG tablet Take 1-1.5 tablets (5-7.5 mg total) by mouth See admin instructions. Take 7.5 mg (1.5 tablets) by mouth on Mon/Fri; take 5 mg (1 tablet) by mouth all other days (Sun, Tues, Wed, [place], Sat) 05/01/23   Christobal Guadalajara, MD    Allergies: Lisinopril    Review of Systems  Constitutional:  Positive for fatigue.  Gastrointestinal:  Positive for abdominal pain.  All other systems reviewed and are negative.   Updated Vital Signs BP 128/79 (BP Location: Left Arm)   Pulse 89   Temp 98.3 F (36.8 C) (Oral)   Resp 16   SpO2 100%   Physical Exam Vitals and nursing note reviewed.  Constitutional:      Appearance: Normal appearance.  HENT:     Head: Normocephalic and atraumatic.     Nose: Nose normal.  Eyes:     Extraocular Movements: Extraocular movements intact.     Conjunctiva/sclera: Conjunctivae normal.     Pupils: Pupils are equal, round, and reactive to light.  Cardiovascular:     Rate and Rhythm: Normal rate.  Pulmonary:  Effort: Pulmonary effort is normal. No respiratory distress.     Breath sounds: Normal breath sounds.  Abdominal:     General: Abdomen is flat.     Palpations: Abdomen is soft.     Tenderness: There is abdominal tenderness. There is no guarding.     Comments: On deep palpation throughout abdomen patient reported some mild discomfort.  Negative Murphy's and negative McBurney's.  Musculoskeletal:        General: Normal range of motion.     Cervical back: Normal range of motion.  Skin:    General: Skin is warm.     Capillary Refill: Capillary refill takes less than 2 seconds.  Neurological:      General: No focal deficit present.     Mental Status: He is alert.  Psychiatric:        Mood and Affect: Mood normal.        Behavior: Behavior normal.     (all labs ordered are listed, but only abnormal results are displayed) Labs Reviewed  CBC - Abnormal; Notable for the following components:      Result Value   Hemoglobin 11.3 (*)    HCT 35.1 (*)    Platelets 510 (*)    All other components within normal limits  COMPREHENSIVE METABOLIC PANEL WITH GFR - Abnormal; Notable for the following components:   Sodium 132 (*)    Glucose, Bld 446 (*)    AST 357 (*)    ALT 454 (*)    Alkaline Phosphatase 932 (*)    All other components within normal limits  CBG MONITORING, ED - Abnormal; Notable for the following components:   Glucose-Capillary 401 (*)    All other components within normal limits  URINALYSIS, ROUTINE W REFLEX MICROSCOPIC  CBG MONITORING, ED    EKG: None  Radiology: No results found.   Procedures   Medications Ordered in the ED - No data to display  89 y.o. male presents to the ED with complaints of hyperglycemia with mild abdominal pain and associated general fatigue, The differential diagnosis includes but is not limited to hyperglycemia, DKA, pancreatitis, cholecystitis, colitis, other viral illness, (Ddx)  On arrival pt is nontoxic, vitals unremarkable. Exam significant for mild pain to palpation throughout abdomen with deep palpation.  I ordered medication and insulin  aspart for hyperglycemia.  Lab Tests:  I Ordered, reviewed, and interpreted labs, which included: CMP, CBC, PT/INR, APTT, UA, CBG, lipase.  Liver enzymes noted to be elevated significantly increased from 3 weeks ago.  Lipase unremarkable and PT/INR and APTT within appropriate range.  Hemoglobin decreased near patient's baseline and improved from several months ago.  Imaging Studies ordered:  I ordered imaging studies which included right upper quadrant ultrasound.  Ultrasound noted  multiple liver lesions, hepatic cyst, and large mass structure in pancreatic head, hepatic steatosis, and ascites.  With multiple lesions and large masslike structure within the pancreatic head it was recommended for nonemergent MRI.  ED Course:   Ultrasound results were discussed with patient and family at bedside.  They report he has had a biopsy of the pancreatic mass and several scans but is being scheduled for further imaging for monitoring and further evaluation of mass.  He was advised to follow-up outpatient with MRI.  Family agreed for outpatient imaging.  Family advised they were advised by PCP to go to the emergency room if glucose elevated above 380 and PCP we will not prescribe insulin  until patient is seen by GI on 11th of  December.  Family was advised of the limitations of the diabetic management from the ED and recommended to follow-up with PCP this week to establish further imaging and get further recommendation of glucose control.  Family agreed with treatment plan and was comfortable discharge at this time.   Portions of this note were generated with Scientist, clinical (histocompatibility and immunogenetics). Dictation errors may occur despite best attempts at proofreading.   Final diagnoses:  None    ED Discharge Orders     None

## 2024-02-23 NOTE — ED Triage Notes (Signed)
 Patient c/o hyperglycemia (456) at home. Patient states his been compliant with medication. Family report his dose was increase 3 weeks ago with he came in with same complain. Patient denies N/V/D. Hx DM 2 and Pancreatitis.

## 2024-02-26 ENCOUNTER — Other Ambulatory Visit: Payer: Self-pay | Admitting: Family Medicine

## 2024-02-26 ENCOUNTER — Telehealth: Payer: Self-pay

## 2024-02-26 DIAGNOSIS — K861 Other chronic pancreatitis: Secondary | ICD-10-CM

## 2024-02-26 NOTE — Telephone Encounter (Signed)
 Patient is scheduled to follow up in our office next week. He has been seen in the ED on two separate occasions and was treated for hyperglycemia.  Last ED visit on 02-23-24, patient had RUQ abd ultrasound which revealed Large mass-like area within the region of the pancreatic head which may represent a large pancreatic mass.  Daughter called stating patient was to see PCP and have MRI ordered.  She wanted to see if you had any further recommendations, prior to appointment next week.   Please advise.

## 2024-02-27 ENCOUNTER — Encounter: Payer: Self-pay | Admitting: Family Medicine

## 2024-02-27 NOTE — Telephone Encounter (Signed)
 Kim aware that this has been dicussed with Dr San and we further discussed his recommendations going forward.  [name] will plan to discuss this further with Dr San in more detail at appointment next week.

## 2024-03-04 ENCOUNTER — Encounter: Payer: Self-pay | Admitting: Gastroenterology

## 2024-03-04 ENCOUNTER — Ambulatory Visit: Admitting: Gastroenterology

## 2024-03-04 ENCOUNTER — Other Ambulatory Visit (INDEPENDENT_AMBULATORY_CARE_PROVIDER_SITE_OTHER)

## 2024-03-04 VITALS — BP 106/66 | HR 100 | Ht 72.0 in | Wt 159.0 lb

## 2024-03-04 DIAGNOSIS — R630 Anorexia: Secondary | ICD-10-CM

## 2024-03-04 DIAGNOSIS — K8689 Other specified diseases of pancreas: Secondary | ICD-10-CM

## 2024-03-04 DIAGNOSIS — K5909 Other constipation: Secondary | ICD-10-CM

## 2024-03-04 DIAGNOSIS — R748 Abnormal levels of other serum enzymes: Secondary | ICD-10-CM | POA: Diagnosis not present

## 2024-03-04 DIAGNOSIS — R634 Abnormal weight loss: Secondary | ICD-10-CM | POA: Diagnosis not present

## 2024-03-04 DIAGNOSIS — R7989 Other specified abnormal findings of blood chemistry: Secondary | ICD-10-CM

## 2024-03-04 LAB — HEPATIC FUNCTION PANEL
ALT: 62 U/L — ABNORMAL HIGH (ref 0–53)
AST: 60 U/L — ABNORMAL HIGH (ref 0–37)
Albumin: 3.5 g/dL (ref 3.5–5.2)
Alkaline Phosphatase: 664 U/L — ABNORMAL HIGH (ref 39–117)
Bilirubin, Direct: 0.1 mg/dL (ref 0.0–0.3)
Total Bilirubin: 0.6 mg/dL (ref 0.2–1.2)
Total Protein: 6.5 g/dL (ref 6.0–8.3)

## 2024-03-04 NOTE — Patient Instructions (Addendum)
 _______________________________________________________  If your blood pressure at your visit was 140/90 or greater, please contact your primary care physician to follow up on this.  _______________________________________________________  If you are age 88 or older, your body mass index should be between 23-30. Your Body mass index is 21.56 kg/m. If this is out of the aforementioned range listed, please consider follow up with your Primary Care Provider.  If you are age 43 or younger, your body mass index should be between 19-25. Your Body mass index is 21.56 kg/m. If this is out of the aformentioned range listed, please consider follow up with your Primary Care Provider.   ________________________________________________________  The [place] [facility] providers would like to encourage you to use MYCHART to communicate with providers for non-urgent requests or questions.  Due to long hold times on the telephone, sending your provider a message by [facility] may be a faster and more efficient way to get a response.  Please allow 48 business hours for a response.  Please remember that this is for non-urgent requests.  _______________________________________________________  [facility] is using a team-based approach to care.  Your team is made up of your doctor and two to three APPS. Our APPS (Nurse Practitioners and Physician Assistants) work with your physician to ensure care continuity for you. They are fully qualified to address your health concerns and develop a treatment plan. They communicate directly with your gastroenterologist to care for you. Seeing the Advanced Practice Practitioners on your physician's team can help you by facilitating care more promptly, often allowing for earlier appointments, access to diagnostic testing, procedures, and other specialty referrals.   Your provider has requested that you go to the basement level for lab work before leaving today. Press B on the  elevator. The lab is located at the first door on the left as you exit the elevator.  Due to recent changes in healthcare laws, you may see the results of your imaging and laboratory studies on MyChart before your provider has had a chance to review them.  We understand that in some cases there may be results that are confusing or concerning to you. Not all laboratory results come back in the same time frame and the provider may be waiting for multiple results in order to interpret others.  Please give us  48 hours in order for your provider to thoroughly review all the results before contacting the office for clarification of your results.   It was a pleasure to see you today!  Vito Cirigliano, D.O.

## 2024-03-04 NOTE — Progress Notes (Signed)
 Chief Complaint:    Abnormal imaging, chronic pancreatitis  GI History: 89 y.o. male with a history of A-fib (on Coumadin , ASA), HTN, TIA (on ASA), diabetes, EtOH pancreatitis (no recent EtOH), GERD.   - 10/2021: Hospital admission for acute pancreatitis with peak lipase 3538. Was evaluated with ultrasound, CT, and responded to conservative therapy.  - 01/21/2023: ER evaluation for epigastric pain and nausea/vomiting.    - Lipase 569    - Normal BMP with BUN/creatinine 9/0.9    - AST/ALT 146/65, ALP 102, T. bili 1.2.  Previous normal liver enzymes on 11/25/2022 as below.    - WBC 19.2, PLT 427, normal H/H at 13/41    - CT A/P: Distended GB up to 4.3 cm in diameter and diffuse circumferential wall thickening with mild mucosal hyperattenuation.  Mildly atrophic pancreas with dilation of the main PD up to 5 mm in the body/tail, no significant interval change.  Subtle heterogeneity of the pancreatic head/uncinate process and minimal surrounding fat stranding, less pronounced compared with exam on 11/13/2021.  Unchanged 5 mm hypoattenuating focus in the left uncinate process.  Mild circumferential thickening in the duodenum c/w duodenitis    - RUQ US : Dilated GB with wall thickening and edema but no stones or duct dilation    - Was treated with Zofran , ceftriaxone , IV fluid with overall improvement and discharged home with Zofran , Protonix , Carafate , Roxicodone .   - 01/23/2023: Initial evaluation in the GI clinic.  Abdominal pain and nausea/vomiting had resolved.  Does report he had been started on metformin  a few months prior.  Otherwise quit drinking 3+ years ago and no relapse.  Recommended MRI/MRCP in 4 weeks and repeat labs.  Patient declined EGD given resolution of symptoms. - 02/24/2023: MRCP: Dilation of the pancreatic duct measuring up to 0.7 cm with abrupt caliber change in the pancreatic head/neck junction with a diffusely atrophic pancreatic parenchyma distal to this area.  Radiologist favors  this to be chronic sequelae of pancreatitis without discrete visible mass.  Normal-appearing head of pancreas.  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 - 04/11/2023: Follow-up in the GI clinic.  Pain in upper abdomen/epigastrium, early satiety, 17# wt loss (although also in concert with having significant dental work and decreased p.o. intake postoperatively).  Stopped atorvastatin , recommended changing from metformin  sent for labs, CT, and referral for EUS -06/12/2023: E[facility] by Dr. Missie at [facility]: Minimal GB sludge, 33 mm x 45 mm hypoechoic irregular and lobulated mass with poorly defined margins in the body of the pancreas with apparent involvement of the splenic artery, superior mesenteric artery, and portal confluence.  Fine-needle biopsy was benign (acute and chronic inflammation without malignant cells).  Recommended repeat MRCP in 3-6 months for ongoing surveillance. - 09/16/2023: MRI/MRCP: 2.4 cm hypoattenuating mass lesion in the pancreatic head, 2.6 cm intramural hyperenhancing lesion in the posterior wall of the distal gastric body suspicious for gastric ulcer or neoplasm - 09/19/2023: EGD: Stricture at 39 cm dilated with 18 mm TTS balloon, single area of nodularity at the Z-line (path benign), 2 cm sliding hiatal hernia, Hill grade 3 valve, mild non-H. pylori gastritis    -03/2023: Hospital admission for syncope/leukocytosis and acute pancreatitis.  No recurrence of abdominal pain     -CT angio chest/abdomen: Acute pancreatitis with PD dilation overall stable from previous     -RUQ US : Unremarkable     -Normal liver enzymes, lipase  49.  Diagnosed as chronic pancreatitis without acute flare by consulting GI service  HPI:     Patient is a 89 y.o. male presenting to the Gastroenterology Clinic for follow-up.  He was last seen by me on 08/13/2023.  Was having dysphagia at that time and  underwent EGD with dilation 08/2023 as above.  Repeat MRI in 09/2023 which was previously reviewed by Dr. Missie.  Lesion largely unchanged and per conversation with family there was no plan to repeat EUS with repeat biopsies.  Went to the ER on 02/23/2024 with hyperglycemia and fatigue.  Evaluation notable for the following: - BG 446 - AST/ALT 357/454, ALP 932, T. bili 0.4 - Normal BUN/creatinine, lipase - H/H 11.3/35.1 (at baseline) with normal WBC.  PLT 510 - INR 2.5 (on Coumadin ) - BG improved to 185 with treatment - RUQ US : Multiple heterogenous low-attenuation liver lesions may represent complex hepatic cyst vs hemangiomas but cannot rule out underlying neoplastic process and recommend MRI.  Large 4.7 x 3.5 x 3.6 cm heterogenous, masslike area in the region of the pancreatic head  Reviewed comparison liver enzymes from 01/30/2024: - AST/ALT 33/32, ALP 292, T. bili 0.3  Scheduled for MRI tomorrow.  Today, states still feeling fatigued. Some mild pain in the RLQ for the last 2 weeks. Not associated with PO intake or BM. Worse when he tries to lay on his right side.  No associated fever, chills.  .   Now on insulin  and BG has been better controlled at home. BG was 123 this AM. Appetite overall down for the last 3 months. Has bene doing mostly Ensure, Boost, etc to maintain caloric intake. Weight down 9# from baseline. No steatorhea.     Review of systems:     No chest pain, no SOB, no fevers, no urinary sx   Past Medical History:  Diagnosis Date   A-fib (HCC)    Acute alcoholic pancreatitis 07/24/2019   Diverticulitis 11/13/2021   Heavy alcohol use 07/24/2019   Hematuria 11/14/2021   Hypertension    TIA (transient ischemic attack)     Patient's surgical history, family medical history, social history, medications and allergies were all reviewed in Epic    Current Outpatient Medications  Medication Sig Dispense Refill   allopurinol  (ZYLOPRIM ) 100 MG tablet Take 1 tablet by  mouth 2 (two) times daily.     amLODipine  (NORVASC ) 10 MG tablet Take 10 mg by mouth daily.     BOTOX 100 units SOLR injection Inject 100 Units into the muscle See admin instructions. Inject 100 units into the eye muscle every 4 months.     glipiZIDE  (GLUCOTROL  XL) 5 MG 24 hr tablet Take 3 tablets (15 mg total) by mouth daily with breakfast. (Patient taking differently: Take 15 mg by mouth daily with breakfast. Pt taking 10 mg) 90 tablet 0   LANTUS SOLOSTAR 100 UNIT/ML Solostar Pen SMARTSIG:10 Unit(s) SUB-Q Daily     latanoprost  (XALATAN ) 0.005 % ophthalmic solution Place 1 drop into both eyes at bedtime.     losartan  (COZAAR ) 25 MG tablet Take 25 mg by mouth daily.     metoprolol  tartrate (LOPRESSOR ) 50 MG tablet Take 75 mg by mouth in the morning and at bedtime.     warfarin (COUMADIN ) 5 MG tablet Take 1-1.5 tablets (5-7.5 mg total) by mouth See admin instructions. Take 7.5 mg (1.5 tablets) by mouth on Mon/Fri; take 5 mg (1 tablet) by mouth all other days (Sun, Tues, Wed, Thurs, Sat)  albuterol (VENTOLIN HFA) 108 (90 Base) MCG/ACT inhaler Inhale 1-2 puffs into the lungs every 6 (six) hours as needed for wheezing or shortness of breath. (Patient not taking: Reported on 03/04/2024)     ondansetron  (ZOFRAN ) 4 MG tablet Take 1 tablet (4 mg total) by mouth every 4 (four) hours as needed for nausea or vomiting. (Patient not taking: Reported on 03/04/2024) 60 tablet 3   No current facility-administered medications for this visit.    Physical Exam:     BP 106/66   Pulse 100   Ht 6' (1.829 m)   Wt 159 lb (72.1 kg)   BMI 21.56 kg/m   GENERAL:  Pleasant male in NAD PSYCH: : Cooperative, normal affect CARDIAC:  RRR, no murmur heard, no peripheral edema PULM: Normal respiratory effort, lungs CTA bilaterally, no wheezing ABDOMEN: Very mild discomfort with deep palpation of the right hemiabdomen.  Abdomen otherwise nondistended, soft.  No peritoneal signs.  No rebound or guarding.  Normal bowel  sounds SKIN:  turgor, no lesions seen Musculoskeletal:  Normal muscle tone, normal strength NEURO: Alert and oriented x 3, no focal neurologic deficits   IMPRESSION and PLAN:    1) Pancreatic mass 2) Decreased appetite 3) Hyperglycemia 4) Weight loss 5) Elevated liver enzymes I again had an in-depth conversation with Nathanie and his daughter in the office today.  Reviewed most recent imaging, ultrasound from ER evaluation earlier this month which was notable for a 4.7 x 3.5 x 3.6 cm heterogenous masslike area in the head of the pancreas.  Allowing for differences in study modalities, this does represent overall increase in size compared with his MRCP from 08/2023 which showed a 2.4 cm lesion.  Previously underwent EUS and while index of suspicion for pancreatic malignancy was elevated, pathology was nondiagnostic.  We again discussed the high probability for pancreatic cancer.  Patient reiterates that he would not want to proceed with any surgical intervention.  Along the same lines, states he would be unlikely to want to pursue repeat EUS to establish tissue diagnosis.  - Scheduled for MRI/MRCP tomorrow.  Will look for those results - Check CA 19-9 - Repeat liver enzymes - Depending on liver enzymes and MRI/MRCP findings, we did discuss consideration of referral to Oncology despite not having a tissue diagnosis to at least discuss whether or not nonsurgical treatment can be employed. - Does have reduced appetite, but otherwise not endorsing obstructive symptoms  6) Chronic constipation - Continue Miralax, prune juice - Continue Colace prn  I spent 45 minutes of time, including in depth chart review, independent review of results as outlined above, communicating results with the patient directly, face-to-face time with the patient, coordinating care, ordering studies and medications as appropriate, and documentation.           Christian Crawford ,DO, FACG 03/04/2024, 8:44 AM

## 2024-03-05 ENCOUNTER — Encounter (HOSPITAL_COMMUNITY): Payer: Self-pay

## 2024-03-05 ENCOUNTER — Emergency Department (HOSPITAL_COMMUNITY)

## 2024-03-05 ENCOUNTER — Observation Stay (HOSPITAL_COMMUNITY): Admission: EM | Admit: 2024-03-05 | Discharge: 2024-03-08 | DRG: 435 | Disposition: A | Source: Ambulatory Visit

## 2024-03-05 ENCOUNTER — Inpatient Hospital Stay: Admission: RE | Admit: 2024-03-05 | Discharge: 2024-03-05 | Attending: Family Medicine | Admitting: Family Medicine

## 2024-03-05 ENCOUNTER — Other Ambulatory Visit: Payer: Self-pay

## 2024-03-05 ENCOUNTER — Ambulatory Visit: Payer: Self-pay | Admitting: Gastroenterology

## 2024-03-05 DIAGNOSIS — D75839 Thrombocytosis, unspecified: Secondary | ICD-10-CM | POA: Diagnosis present

## 2024-03-05 DIAGNOSIS — D649 Anemia, unspecified: Secondary | ICD-10-CM | POA: Diagnosis present

## 2024-03-05 DIAGNOSIS — R1084 Generalized abdominal pain: Principal | ICD-10-CM

## 2024-03-05 DIAGNOSIS — K861 Other chronic pancreatitis: Secondary | ICD-10-CM

## 2024-03-05 DIAGNOSIS — E876 Hypokalemia: Secondary | ICD-10-CM | POA: Diagnosis present

## 2024-03-05 DIAGNOSIS — K8689 Other specified diseases of pancreas: Secondary | ICD-10-CM | POA: Diagnosis present

## 2024-03-05 DIAGNOSIS — K831 Obstruction of bile duct: Secondary | ICD-10-CM | POA: Diagnosis present

## 2024-03-05 DIAGNOSIS — C787 Secondary malignant neoplasm of liver and intrahepatic bile duct: Secondary | ICD-10-CM | POA: Diagnosis not present

## 2024-03-05 DIAGNOSIS — E785 Hyperlipidemia, unspecified: Secondary | ICD-10-CM | POA: Diagnosis present

## 2024-03-05 DIAGNOSIS — R7989 Other specified abnormal findings of blood chemistry: Secondary | ICD-10-CM | POA: Diagnosis present

## 2024-03-05 DIAGNOSIS — J449 Chronic obstructive pulmonary disease, unspecified: Secondary | ICD-10-CM | POA: Diagnosis present

## 2024-03-05 DIAGNOSIS — H40009 Preglaucoma, unspecified, unspecified eye: Secondary | ICD-10-CM | POA: Diagnosis present

## 2024-03-05 DIAGNOSIS — I5189 Other ill-defined heart diseases: Secondary | ICD-10-CM

## 2024-03-05 DIAGNOSIS — I10 Essential (primary) hypertension: Secondary | ICD-10-CM | POA: Diagnosis present

## 2024-03-05 DIAGNOSIS — R791 Abnormal coagulation profile: Secondary | ICD-10-CM | POA: Diagnosis present

## 2024-03-05 DIAGNOSIS — I480 Paroxysmal atrial fibrillation: Secondary | ICD-10-CM | POA: Diagnosis present

## 2024-03-05 DIAGNOSIS — K219 Gastro-esophageal reflux disease without esophagitis: Secondary | ICD-10-CM | POA: Diagnosis present

## 2024-03-05 LAB — URINALYSIS, ROUTINE W REFLEX MICROSCOPIC
Bacteria, UA: NONE SEEN
Bilirubin Urine: NEGATIVE
Glucose, UA: NEGATIVE mg/dL
Hgb urine dipstick: NEGATIVE
Ketones, ur: NEGATIVE mg/dL
Leukocytes,Ua: NEGATIVE
Nitrite: NEGATIVE
Protein, ur: 100 mg/dL — AB
Specific Gravity, Urine: 1.014 (ref 1.005–1.030)
pH: 6 (ref 5.0–8.0)

## 2024-03-05 LAB — CBC WITH DIFFERENTIAL/PLATELET
Abs Immature Granulocytes: 0.06 K/uL (ref 0.00–0.07)
Basophils Absolute: 0.1 K/uL (ref 0.0–0.1)
Basophils Relative: 0 %
Eosinophils Absolute: 0.5 K/uL (ref 0.0–0.5)
Eosinophils Relative: 3 %
HCT: 37.1 % — ABNORMAL LOW (ref 39.0–52.0)
Hemoglobin: 11.6 g/dL — ABNORMAL LOW (ref 13.0–17.0)
Immature Granulocytes: 0 %
Lymphocytes Relative: 12 %
Lymphs Abs: 1.8 K/uL (ref 0.7–4.0)
MCH: 25.9 pg — ABNORMAL LOW (ref 26.0–34.0)
MCHC: 31.3 g/dL (ref 30.0–36.0)
MCV: 82.8 fL (ref 80.0–100.0)
Monocytes Absolute: 1.6 K/uL — ABNORMAL HIGH (ref 0.1–1.0)
Monocytes Relative: 11 %
Neutro Abs: 11.1 K/uL — ABNORMAL HIGH (ref 1.7–7.7)
Neutrophils Relative %: 74 %
Platelets: 615 K/uL — ABNORMAL HIGH (ref 150–400)
RBC: 4.48 MIL/uL (ref 4.22–5.81)
RDW: 14.3 % (ref 11.5–15.5)
WBC: 15.1 K/uL — ABNORMAL HIGH (ref 4.0–10.5)
nRBC: 0 % (ref 0.0–0.2)

## 2024-03-05 LAB — COMPREHENSIVE METABOLIC PANEL WITH GFR
ALT: 76 U/L — ABNORMAL HIGH (ref 0–44)
AST: 78 U/L — ABNORMAL HIGH (ref 15–41)
Albumin: 3.7 g/dL (ref 3.5–5.0)
Alkaline Phosphatase: 853 U/L — ABNORMAL HIGH (ref 38–126)
Anion gap: 11 (ref 5–15)
BUN: 12 mg/dL (ref 8–23)
CO2: 27 mmol/L (ref 22–32)
Calcium: 9.6 mg/dL (ref 8.9–10.3)
Chloride: 102 mmol/L (ref 98–111)
Creatinine, Ser: 0.86 mg/dL (ref 0.61–1.24)
GFR, Estimated: >60 mL/min (ref >60)
Glucose, Bld: 108 mg/dL — ABNORMAL HIGH (ref 70–99)
Potassium: 3.4 mmol/L — ABNORMAL LOW (ref 3.5–5.1)
Sodium: 140 mmol/L (ref 135–145)
Total Bilirubin: 0.5 mg/dL (ref 0.0–1.2)
Total Protein: 7.3 g/dL (ref 6.5–8.1)

## 2024-03-05 LAB — APTT: aPTT: 74 s — ABNORMAL HIGH (ref 24–36)

## 2024-03-05 LAB — MAGNESIUM: Magnesium: 2.2 mg/dL (ref 1.7–2.4)

## 2024-03-05 LAB — LIPASE, BLOOD: Lipase: 18 U/L (ref 11–51)

## 2024-03-05 LAB — PROTIME-INR
INR: 4.9 (ref 0.8–1.2)
Prothrombin Time: 47.7 s — ABNORMAL HIGH (ref 11.4–15.2)

## 2024-03-05 LAB — TYPE AND SCREEN
ABO/RH(D): AB POS
Antibody Screen: NEGATIVE

## 2024-03-05 LAB — PHOSPHORUS: Phosphorus: 2.9 mg/dL (ref 2.5–4.6)

## 2024-03-05 MED ORDER — PIPERACILLIN-TAZOBACTAM 3.375 G IVPB 30 MIN
3.3750 g | Freq: Once | INTRAVENOUS | Status: AC
  Administered 2024-03-05: 3.375 g via INTRAVENOUS
  Filled 2024-03-05: qty 50

## 2024-03-05 MED ORDER — PIPERACILLIN-TAZOBACTAM 3.375 G IVPB
3.3750 g | Freq: Three times a day (TID) | INTRAVENOUS | Status: DC
  Administered 2024-03-05 – 2024-03-08 (×8): 3.375 g via INTRAVENOUS
  Filled 2024-03-05 (×8): qty 50

## 2024-03-05 MED ORDER — LOSARTAN POTASSIUM 50 MG PO TABS
25.0000 mg | ORAL_TABLET | Freq: Every day | ORAL | Status: DC
  Administered 2024-03-05 – 2024-03-08 (×4): 25 mg via ORAL
  Filled 2024-03-05 (×4): qty 1

## 2024-03-05 MED ORDER — METOPROLOL TARTRATE 50 MG PO TABS
75.0000 mg | ORAL_TABLET | Freq: Two times a day (BID) | ORAL | Status: DC
  Administered 2024-03-05 – 2024-03-08 (×6): 75 mg via ORAL
  Filled 2024-03-05 (×6): qty 1

## 2024-03-05 MED ORDER — ONDANSETRON HCL 4 MG/2ML IJ SOLN: 4.0000 mg | Freq: Four times a day (QID) | INTRAMUSCULAR | Status: DC | PRN

## 2024-03-05 MED ORDER — POTASSIUM CHLORIDE CRYS ER 20 MEQ PO TBCR
40.0000 meq | EXTENDED_RELEASE_TABLET | Freq: Once | ORAL | Status: AC
  Administered 2024-03-05: 40 meq via ORAL
  Filled 2024-03-05: qty 2

## 2024-03-05 MED ORDER — SODIUM CHLORIDE 0.9 % IV BOLUS
1000.0000 mL | Freq: Once | INTRAVENOUS | Status: AC
  Administered 2024-03-05: 1000 mL via INTRAVENOUS

## 2024-03-05 MED ORDER — ALLOPURINOL 100 MG PO TABS
100.0000 mg | ORAL_TABLET | Freq: Every day | ORAL | Status: DC
  Administered 2024-03-05 – 2024-03-08 (×4): 100 mg via ORAL
  Filled 2024-03-05 (×4): qty 1

## 2024-03-05 MED ORDER — ONDANSETRON HCL 4 MG PO TABS: 4.0000 mg | ORAL_TABLET | Freq: Four times a day (QID) | ORAL | Status: DC | PRN

## 2024-03-05 MED ORDER — INSULIN GLARGINE-YFGN 100 UNIT/ML ~~LOC~~ SOLN
10.0000 [IU] | Freq: Every day | SUBCUTANEOUS | Status: DC
  Administered 2024-03-06 – 2024-03-08 (×3): 10 [IU] via SUBCUTANEOUS
  Filled 2024-03-05 (×3): qty 0.1

## 2024-03-05 MED ORDER — PIPERACILLIN-TAZOBACTAM 3.375 G IVPB 30 MIN: 3.3750 g | Freq: Three times a day (TID) | INTRAVENOUS | Status: DC

## 2024-03-05 MED ORDER — GLIPIZIDE ER 10 MG PO TB24
10.0000 mg | ORAL_TABLET | Freq: Two times a day (BID) | ORAL | Status: DC
  Administered 2024-03-06: 10 mg via ORAL
  Filled 2024-03-05: qty 1

## 2024-03-05 MED ORDER — MORPHINE SULFATE (PF) 2 MG/ML IV SOLN
2.0000 mg | INTRAVENOUS | Status: DC | PRN
  Administered 2024-03-06 – 2024-03-08 (×3): 2 mg via INTRAVENOUS
  Filled 2024-03-05 (×3): qty 1

## 2024-03-05 MED ORDER — LATANOPROST 0.005 % OP SOLN
1.0000 [drp] | Freq: Every day | OPHTHALMIC | Status: DC
  Administered 2024-03-05 – 2024-03-07 (×3): 1 [drp] via OPHTHALMIC
  Filled 2024-03-05: qty 2.5

## 2024-03-05 MED ORDER — MORPHINE SULFATE (PF) 2 MG/ML IV SOLN
2.0000 mg | Freq: Once | INTRAVENOUS | Status: AC
  Administered 2024-03-05: 2 mg via INTRAVENOUS
  Filled 2024-03-05: qty 1

## 2024-03-05 MED ORDER — AMLODIPINE BESYLATE 10 MG PO TABS
10.0000 mg | ORAL_TABLET | Freq: Every day | ORAL | Status: DC
  Administered 2024-03-05 – 2024-03-08 (×4): 10 mg via ORAL
  Filled 2024-03-05 (×4): qty 1

## 2024-03-05 NOTE — Plan of Care (Signed)

## 2024-03-05 NOTE — ED Notes (Signed)
 Requested urine sample from patient twice. Says he can't pee because he hasn't had anything to drink. Nurse notified.

## 2024-03-05 NOTE — ED Triage Notes (Signed)
 Patient in today reporting elevated PT and INR per dr. Visit. Hx of same. Reports confusion on when to take coumadin . Hx of pancreatitis, DM2.

## 2024-03-05 NOTE — ED Provider Notes (Signed)
 [place] EMERGENCY DEPARTMENT AT [facility] Provider Note   CSN: [id number] Arrival date & time: 03/05/24  9070     Patient presents with: Abnormal Lab   Christian Crawford is a 89 y.o. male.    Abnormal Lab   Patient presents because of abnormal labs.  Patient states that he was told to come to the ED because of elevated INR.  Patient states that his INR was over 6.  Went to PCP today where he had this measure.  Patient states he takes warfarin in the setting of A-fib.  Did not take warfarin on Wednesday but did take 5 mg last night.  Took as prescribed by PCP.  With by PCPs office today and found INR to be greater than 6.  Patient denies any recent falls.  No bruising to the skin.  Not spitting up blood.  No blood in stool.  No bright red blood or melena.  He has chronic abdominal pain but no acute changes.  Still having bowel movements.  Still producing urine.  No fever no chills.  No headaches or vision changes.  Otherwise denies all complaints.   Previous medical history reviewed : Patient last seen in the ED on December 1 in the setting of hyperglycemia.  Followed up with gastroenterology yesterday.  MRI scheduled for today.  Has a pancreatic mass.      Prior to Admission medications  Medication Sig Start Date End Date Taking? Authorizing Provider  albuterol (VENTOLIN HFA) 108 (90 Base) MCG/ACT inhaler Inhale 1-2 puffs into the lungs every 6 (six) hours as needed for wheezing or shortness of breath. Patient not taking: Reported on 03/04/2024 11/12/21   [provider]  allopurinol  (ZYLOPRIM ) 100 MG tablet Take 1 tablet by mouth 2 (two) times daily. 05/18/23   [provider]  amLODipine  (NORVASC ) 10 MG tablet Take 10 mg by mouth daily.    [provider]  BOTOX 100 units SOLR injection Inject 100 Units into the muscle See admin instructions. Inject 100 units into the eye muscle every 4 months.    [provider]  glipiZIDE  (GLUCOTROL   XL) 5 MG 24 hr tablet Take 3 tablets (15 mg total) by mouth daily with breakfast. Patient taking differently: Take 15 mg by mouth daily with breakfast. Pt taking 10 mg 01/30/24 03/04/24  Meredith, Savannah F, PA-C  LANTUS SOLOSTAR 100 UNIT/ML Solostar Pen SMARTSIG:10 Unit(s) SUB-Q Daily 02/25/24   [provider]  latanoprost  (XALATAN ) 0.005 % ophthalmic solution Place 1 drop into both eyes at bedtime. 06/10/19   [provider]  losartan  (COZAAR ) 25 MG tablet Take 25 mg by mouth daily.    [provider]  metoprolol  tartrate (LOPRESSOR ) 50 MG tablet Take 75 mg by mouth in the morning and at bedtime.    [provider]  ondansetron  (ZOFRAN ) 4 MG tablet Take 1 tablet (4 mg total) by mouth every 4 (four) hours as needed for nausea or vomiting. Patient not taking: Reported on 03/04/2024 04/11/23   Cirigliano, Vito V, DO  warfarin (COUMADIN ) 5 MG tablet Take 1-1.5 tablets (5-7.5 mg total) by mouth See admin instructions. Take 7.5 mg (1.5 tablets) by mouth on Mon/Fri; take 5 mg (1 tablet) by mouth all other days (Sun, Tues, Wed, [place], Sat) 05/01/23   Christobal Guadalajara, MD    Allergies: Lisinopril    Review of Systems  Constitutional:  Negative for chills and fever.  HENT:  Negative for ear pain and sore throat.   Eyes:  Negative for pain and visual disturbance.  Respiratory:  Negative for cough and shortness of breath.   Cardiovascular:  Negative for chest pain and palpitations.  Gastrointestinal:  Negative for abdominal pain and vomiting.  Genitourinary:  Negative for dysuria and hematuria.  Musculoskeletal:  Negative for arthralgias and back pain.  Skin:  Negative for color change and rash.  Neurological:  Negative for seizures and syncope.  All other systems reviewed and are negative.   Updated Vital Signs BP (!) 140/69 (BP Location: Right Arm)   Pulse (!) 103   Temp 97.9 F (36.6 C) (Oral)   Resp 20   SpO2 99%   Physical Exam Vitals and nursing note  reviewed.  Constitutional:      General: He is not in acute distress.    Appearance: He is well-developed.  HENT:     Head: Normocephalic and atraumatic.  Eyes:     Conjunctiva/sclera: Conjunctivae normal.  Cardiovascular:     Rate and Rhythm: Normal rate and regular rhythm.     Heart sounds: No murmur heard. Pulmonary:     Effort: Pulmonary effort is normal. No respiratory distress.     Breath sounds: Normal breath sounds.  Abdominal:     Palpations: Abdomen is soft.     Tenderness: There is no abdominal tenderness.  Musculoskeletal:        General: No swelling.     Cervical back: Neck supple.  Skin:    General: Skin is warm and dry.     Capillary Refill: Capillary refill takes less than 2 seconds.  Neurological:     Mental Status: He is alert.  Psychiatric:        Mood and Affect: Mood normal.     (all labs ordered are listed, but only abnormal results are displayed) Labs Reviewed  COMPREHENSIVE METABOLIC PANEL WITH GFR - Abnormal; Notable for the following components:      Result Value   Potassium 3.4 (*)    Glucose, Bld 108 (*)    AST 78 (*)    ALT 76 (*)    Alkaline Phosphatase 853 (*)    All other components within normal limits  CBC WITH DIFFERENTIAL/PLATELET - Abnormal; Notable for the following components:   WBC 15.1 (*)    Hemoglobin 11.6 (*)    HCT 37.1 (*)    MCH 25.9 (*)    Platelets 615 (*)    Neutro Abs 11.1 (*)    Monocytes Absolute 1.6 (*)    All other components within normal limits  PROTIME-INR - Abnormal; Notable for the following components:   Prothrombin Time 47.7 (*)    INR 4.9 (*)    All other components within normal limits  APTT - Abnormal; Notable for the following components:   aPTT 74 (*)    All other components within normal limits  CULTURE, BLOOD (ROUTINE X 2)  CULTURE, BLOOD (ROUTINE X 2)  LIPASE, BLOOD  URINALYSIS, ROUTINE W REFLEX MICROSCOPIC  MAGNESIUM   PHOSPHORUS  TYPE AND SCREEN    EKG: None  Radiology: MR  ABDOMEN WO CONTRAST Result Date: 03/05/2024 EXAM: MRCP WITHOUT IV CONTRAST 03/05/2024 07:41:04 AM TECHNIQUE: Multisequence, multiplanar magnetic resonance images of the abdomen without intravenous contrast. MRCP sequences were performed. COMPARISON: MR Abdomen without and with IV contrast 09/16/2023; 02/24/2023. Clinic note of 03/04/2024 from gastroenterology. CLINICAL HISTORY: Complex history including pancreatitis with known pancreatic mass. Patient not interested in surgical resection. FINDINGS: FINDINGS: Moderate multifocal degradation, including motion and lack of IV contrast. LIVER:  Interval development of diffuse T2 hyperintense liver lesions which are most apparent on long b-value diffusion-weighted imaging including 93 / 13. GALLBLADDER AND BILIARY SYSTEM: Moderate gallbladder distention, likely related to biliary obstruction. Development of moderate intrahepatic biliary duct dilatation including on image 18 / 5. The common duct measures 1.6 cm on image 23 / 5 versus normal in caliber on the prior. SPLEEN: Previously described splenic vein thrombus is not well evaluated on noncontrast exam. PANCREAS/PANCREATIC DUCT: Suboptimally evaluated soft tissue fullness within the pancreatic tail including on image 22 / 4 , felt to be increased. Pancreatic duct dilatation at 1.3 cm on image 23 / 5, increased from 9 mm on the prior. Followed to the level of increasing soft tissue fullness in the pancreatic head / uncinate process. Estimated at 3.7 x 3.0 cm on image 28 / 5. Increased from 2.3 x 2.5 cm on the prior exam when re-measured. Suspect mild superimposed peripancreatic edema. ADRENAL GLANDS: Unremarkable. KIDNEYS: Mild bilateral renal cortical thinning. LYMPH NODES: No enlarged abdominal lymph nodes. VASCULATURE: Normal aortic caliber. Previously described splenic vein thrombus is not well evaluated on noncontrast exam. PERITONEUM: Small volume perihepatic and perisplenic ascites is new. ABDOMINAL WALL: No  hernia. No mass. BOWEL: Proximal gastric underdistention. Grossly normal abdominal bowel loops. No bowel obstruction. BONES: No acute abnormality or worrisome osseous lesion. SOFT TISSUES: Unremarkable. MISCELLANEOUS: Small left pleural effusion is new since the prior MRI. IMPRESSION: 1. Moderate multifocal image degradation, including motion and lack of intravenous contrast. 2. Development of large-volume diffuse hepatic metastases. 3. Increase in pancreatic duct caliber and new biliary duct dilatation to the level of increasing soft tissue fullness in the pancreatic head and uncinate process, suspicious for adenocarcinoma. 4. Suggestion of increased soft tissue fullness within the pancreatic tail, which could represent pancreatitis or a second primary carcinoma. 5. New small left pleural effusion and abdominal ascites. 6. Gallbladder distention, likely related to biliary obstruction. Electronically signed by: Rockey Kilts MD 03/05/2024 12:20 PM EST RP Workstation: [id number]   DG Chest Portable 1 View Result Date: 03/05/2024 CLINICAL DATA:  Confusion. EXAM: PORTABLE CHEST 1 VIEW COMPARISON:  04/29/2023 FINDINGS: New left lower lobe opacity is seen, suspicious for pneumonia. Tiny left pleural effusion also noted. Right lung is clear. Heart size is normal. IMPRESSION: New left lower lobe opacity, suspicious for pneumonia. Tiny left pleural effusion. Electronically Signed   By: Norleen DELENA Kil M.D.   On: 03/05/2024 12:09     Procedures   Medications Ordered in the ED  ondansetron  (ZOFRAN ) tablet 4 mg (has no administration in time range)    Or  ondansetron  (ZOFRAN ) injection 4 mg (has no administration in time range)  potassium chloride  SA (KLOR-CON  M) CR tablet 40 mEq (has no administration in time range)  piperacillin -tazobactam (ZOSYN ) IVPB 3.375 g (has no administration in time range)  piperacillin -tazobactam (ZOSYN ) IVPB 3.375 g (0 g Intravenous Stopped 03/05/24 1540)  sodium chloride  0.9 % bolus  1,000 mL (0 mLs Intravenous Stopped 03/05/24 1540)    Clinical Course as of 03/05/24 1551  Fri Mar 05, 2024  1346 INR of 4.9  [TL]    Clinical Course User Index [TL] Simon Lavonia SAILOR, MD                                 Medical Decision Making Amount and/or Complexity of Data Reviewed Labs: ordered. Radiology: ordered.  Risk Prescription drug management. Decision regarding hospitalization.  HPI:  Patient presents because of abnormal labs.  Patient states that he was told to come to the ED because of elevated INR.  Patient states that his INR was over 6.  Went to PCP today where he had this measure.  Patient states he takes warfarin in the setting of A-fib.  Did not take warfarin on Wednesday but did take 5 mg last night.  Took as prescribed by PCP.  With by PCPs office today and found INR to be greater than 6.  Patient denies any recent falls.  No bruising to the skin.  Not spitting up blood.  No blood in stool.  No bright red blood or melena.  He has chronic abdominal pain but no acute changes.  Still having bowel movements.  Still producing urine.  No fever no chills.  No headaches or vision changes.  Otherwise denies all complaints.   Previous medical history reviewed : Patient last seen in the ED on December 1 in the setting of hyperglycemia.  Followed up with gastroenterology yesterday.  MRI scheduled for today.  Has a pancreatic mass.   MDM:    Upon exam, patient is A and O x 3 with GCS 15.  No focal deficits.  Hemodynamically stable.  No active signs of bleeding.  Will obtain INR levels.  Will obtain hemoglobin levels to make sure there is no significant anemia and signs of bleeding at this time.  Last AST and ALT showed levels of 60 and 62 respectively.  Alk phos is 664.  Will repeat liver enzymes.  Patient has a known pancreatic mass that is being worked up for.  No acute abdominal complaints.  Patient has MRI today.  I will call radiology to see if we can expedite the  read instead of getting repeat imaging here CAT scan here.    Reevaluation:   Upon reexamination, patient hemodynamically stable.  Remains A&O x 3 with GCS 15.  Patient does have leukocytosis.  Increasing white blood cell count and left shift compared to most recent labs.  Hemoglobin stable.  INR is elevated to above 4 but no indication for reversal at this point time given patient is asymptomatic without current evidence of ongoing bleeding.  More concerning finding for him this is increasing leukocytosis.  Reviewed patient's MRI as below.  Obviously concern for hepatic metastasis.  Increasing pancreatic duct caliber, biliary distention as well.  Given this, do think I need to cover him with IV Zosyn  to cover for any kind of ascending infection of the biliary tree.   I did consult gastroenterology as well.  Appreciate their input.  In agreement with management.  Patient will be placed in observation with GI consult.    MRI from today reviewed:  IMPRESSION: 1. Moderate multifocal image degradation, including motion and lack of intravenous contrast. 2. Development of large-volume diffuse hepatic metastases. 3. Increase in pancreatic duct caliber and new biliary duct dilatation to the level of increasing soft tissue fullness in the pancreatic head and uncinate process, suspicious for adenocarcinoma. 4. Suggestion of increased soft tissue fullness within the pancreatic tail, which could represent pancreatitis or a second primary carcinoma. 5. New small left pleural effusion and abdominal ascites. 6. Gallbladder distention, likely related to biliary obstruction.   Interventions: zosyn , 1 L ns   EKG Interpreted by Me: sinus    Cardiac Tele Interpreted by Me: sinus     Social Determinant of Health: denies drugs/alcohol.    Disposition and Follow Up: admit  Final diagnoses:  Generalized abdominal pain  Elevated INR    ED Discharge Orders     None           Simon Lavonia SAILOR, MD 03/05/24 1551

## 2024-03-05 NOTE — Consult Note (Addendum)
 Consultation  Referring Provider:   Dr. Simon Primary Care Physician:  Maree Leni Edyth DELENA, MD Primary Gastroenterologist: Dr. San        Reason for Consultation:    Pancreatic mass and biliary duct dilation         HPI:   Marcella Dunnaway is a 89 y.o. male with a past medical history as listed below including A-fib on Coumadin , acute alcoholic pancreatitis, TIA and multiple others, who presented to the ED today because of an elevated INR.    At time presentation patient described that his INR was found to be over 6, apparently went to his PCP where he had this measured.  He is on Warfarin in the setting of A-fib, but did not take warfarin on Wednesday, did take 5 mg last night.    Today, patient seen with his daughter by his bedside.  They discussed that he is well aware of his diagnosis of likely pancreatic cancer, though biopsies were inconclusive back in June.  He has acutely been feeling worse over the past week or so with decreased appetite and about a 9 pound weight loss in 2 weeks.  He has some chronic right sided abdominal pain which inhibits him from sleeping on this side as it would worsen, but otherwise does not experience any nausea, vomiting or change in symptoms recently.  He and his daughter discussed that he really does not want further workup for pancreatic cancer.  He is uninterested in repeat EUS.  They are interested in following with oncology to see if they could offer him anything or at least discussed possibilities without having a firm diagnosis.  Otherwise patient had no acute change in symptoms, he is really just here because his INR was found to be over 6 at his PCPs office.    Denies fever, chills, vomiting or symptoms that awaken him from sleep.  ED course: MRCP with development of large volume diffuse hepatic metastasis, soft tissue fullness in the pancreatic tail, pancreatic duct dilation at 1.3 cm increased from 9 mm on the prior, soft tissue fullness in the  pancreatic head/uncinate process estimated 3.7 x 3.0 cm, increased from 2.3 x 2.5 cm on the prior exam, suspected mild superimposed peripancreatic edema, new biliary duct dilation to the level of increasing soft tissue fullness in the pancreatic head suspicious for adenocarcinoma; CMP with potassium of 3.4, AST 78 (11 days ago 357--> 60 yesterday), ALT 76 (11 days ago 454--> 62 yesterday), alk phos 853 (11 days ago at 932--> 664 yesterday), total bili 0.5, repeat INR in the ED 4.9, lipase 18  GI history: 03/04/2024 office visit with Dr. San and at that time had some fatigue and mild pain in the right lower quadrant for 2 weeks, on insulin  with better control blood glucose, at that time discussed recent imaging and ultrasound from ER earlier this month which was notable for masslike area in the head of the pancreas, increased in size from June, previously undergone EUS and low index of suspicion for pancreatic malignancy was elevated pathology was nondiagnostic, discussed high probability for pancreatic cancer and patient reiterated that he would not want to proceed with any surgical intervention and did not want repeat EUS, he was scheduled for MRI/MRCP and check CA 19-9 as well as liver enzymes, continued on MiraLAX for constipation  History from Dr. Rennis note: - 10/2021: Hospital admission for acute pancreatitis with peak lipase 3538. Was evaluated with ultrasound, CT, and responded to conservative therapy.  -  01/21/2023: ER evaluation for epigastric pain and nausea/vomiting.    - Lipase 569    - Normal BMP with BUN/creatinine 9/0.9    - AST/ALT 146/65, ALP 102, T. bili 1.2.  Previous normal liver enzymes on 11/25/2022 as below.    - WBC 19.2, PLT 427, normal H/H at 13/41    - CT A/P: Distended GB up to 4.3 cm in diameter and diffuse circumferential wall thickening with mild mucosal hyperattenuation.  Mildly atrophic pancreas with dilation of the main PD up to 5 mm in the body/tail, no  significant interval change.  Subtle heterogeneity of the pancreatic head/uncinate process and minimal surrounding fat stranding, less pronounced compared with exam on 11/13/2021.  Unchanged 5 mm hypoattenuating focus in the left uncinate process.  Mild circumferential thickening in the duodenum c/w duodenitis    - RUQ US : Dilated GB with wall thickening and edema but no stones or duct dilation    - Was treated with Zofran , ceftriaxone , IV fluid with overall improvement and discharged home with Zofran , Protonix , Carafate , Roxicodone .   - 01/23/2023: Initial evaluation in the GI clinic.  Abdominal pain and nausea/vomiting had resolved.  Does report he had been started on metformin  a few months prior.  Otherwise quit drinking 3+ years ago and no relapse.  Recommended MRI/MRCP in 4 weeks and repeat labs.  Patient declined EGD given resolution of symptoms. - 02/24/2023: MRCP: Dilation of the pancreatic duct measuring up to 0.7 cm with abrupt caliber change in the pancreatic head/neck junction with a diffusely atrophic pancreatic parenchyma distal to this area.  Radiologist favors this to be chronic sequelae of pancreatitis without discrete visible mass.  Normal-appearing head of pancreas.  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 - 04/11/2023: Follow-up in the GI clinic.  Pain in upper abdomen/epigastrium, early satiety, 17# wt loss (although also in concert with having significant dental work and decreased p.o. intake postoperatively).  Stopped atorvastatin , recommended changing from metformin  sent for labs, CT, and referral for EUS -06/12/2023: EUS by Dr. Missie at [facility]: Minimal GB sludge, 33 mm x 45 mm hypoechoic irregular and lobulated mass with poorly defined margins in the body of the pancreas with apparent involvement of the splenic artery, superior mesenteric artery, and portal confluence.   Fine-needle biopsy was benign (acute and chronic inflammation without malignant cells).  Recommended repeat MRCP in 3-6 months for ongoing surveillance. - 09/16/2023: MRI/MRCP: 2.4 cm hypoattenuating mass lesion in the pancreatic head, 2.6 cm intramural hyperenhancing lesion in the posterior wall of the distal gastric body suspicious for gastric ulcer or neoplasm - 09/19/2023: EGD: Stricture at 39 cm dilated with 18 mm TTS balloon, single area of nodularity at the Z-line (path benign), 2 cm sliding hiatal hernia, Hill grade 3 valve, mild non-H. pylori gastritis     -03/2023: Hospital admission for syncope/leukocytosis and acute pancreatitis.  No recurrence of abdominal pain     -CT angio chest/abdomen: Acute pancreatitis with PD dilation overall stable from previous     -RUQ US : Unremarkable     -Normal liver enzymes, lipase 49.  Diagnosed as chronic pancreatitis without acute flare by consulting GI service  Repeat MRI in 09/2023 which was previously reviewed by Dr. Missie.  Lesion largely unchanged and per conversation with family there was no plan to repeat EUS with repeat biopsies.   Went to the ER on 02/23/2024 with hyperglycemia and fatigue.  Evaluation  notable for the following: - BG 446 - AST/ALT 357/454, ALP 932, T. bili 0.4 - Normal BUN/creatinine, lipase - H/H 11.3/35.1 (at baseline) with normal WBC.  PLT 510 - INR 2.5 (on Coumadin ) - BG improved to 185 with treatment - RUQ US : Multiple heterogenous low-attenuation liver lesions may represent complex hepatic cyst vs hemangiomas but cannot rule out underlying neoplastic process and recommend MRI.  Large 4.7 x 3.5 x 3.6 cm heterogenous, masslike area in the region of the pancreatic head   Reviewed comparison liver enzymes from 01/30/2024: - AST/ALT 33/32, ALP 292, T. bili 0.3    Past Medical History:  Diagnosis Date   A-fib (HCC)    Acute alcoholic pancreatitis 07/24/2019   Diverticulitis 11/13/2021   Heavy alcohol use 07/24/2019    Hematuria 11/14/2021   Hypertension    TIA (transient ischemic attack)     Past Surgical History:  Procedure Laterality Date   EYE SURGERY     had droopy eye    Family History  Problem Relation Age of Onset   Colon cancer Brother    Esophageal cancer Neg Hx    Liver disease Neg Hx     Social History[1]  Prior to Admission medications  Medication Sig Start Date End Date Taking? Authorizing Provider  albuterol (VENTOLIN HFA) 108 (90 Base) MCG/ACT inhaler Inhale 1-2 puffs into the lungs every 6 (six) hours as needed for wheezing or shortness of breath. Patient not taking: Reported on 03/04/2024 11/12/21   [provider]  allopurinol  (ZYLOPRIM ) 100 MG tablet Take 1 tablet by mouth 2 (two) times daily. 05/18/23   [provider]  amLODipine  (NORVASC ) 10 MG tablet Take 10 mg by mouth daily.    [provider]  BOTOX 100 units SOLR injection Inject 100 Units into the muscle See admin instructions. Inject 100 units into the eye muscle every 4 months.    [provider]  glipiZIDE  (GLUCOTROL  XL) 5 MG 24 hr tablet Take 3 tablets (15 mg total) by mouth daily with breakfast. Patient taking differently: Take 15 mg by mouth daily with breakfast. Pt taking 10 mg 01/30/24 03/04/24  Meredith, Savannah F, PA-C  LANTUS SOLOSTAR 100 UNIT/ML Solostar Pen SMARTSIG:10 Unit(s) SUB-Q Daily 02/25/24   [provider]  latanoprost  (XALATAN ) 0.005 % ophthalmic solution Place 1 drop into both eyes at bedtime. 06/10/19   [provider]  losartan  (COZAAR ) 25 MG tablet Take 25 mg by mouth daily.    [provider]  metoprolol  tartrate (LOPRESSOR ) 50 MG tablet Take 75 mg by mouth in the morning and at bedtime.    [provider]  ondansetron  (ZOFRAN ) 4 MG tablet Take 1 tablet (4 mg total) by mouth every 4 (four) hours as needed for nausea or vomiting. Patient not taking: Reported on 03/04/2024 04/11/23   Cirigliano, Vito V, DO  warfarin  (COUMADIN ) 5 MG tablet Take 1-1.5 tablets (5-7.5 mg total) by mouth See admin instructions. Take 7.5 mg (1.5 tablets) by mouth on Mon/Fri; take 5 mg (1 tablet) by mouth all other days (Sun, Tues, Wed, Thurs, Sat) 05/01/23   Christobal Guadalajara, MD    Current Facility-Administered Medications  Medication Dose Route Frequency Provider Last Rate Last Admin   piperacillin -tazobactam (ZOSYN ) IVPB 3.375 g  3.375 g Intravenous Once Lemly, Tatum N, MD       Current Outpatient Medications  Medication Sig Dispense Refill   albuterol (VENTOLIN HFA) 108 (90 Base) MCG/ACT inhaler Inhale 1-2 puffs into the lungs every 6 (six)  hours as needed for wheezing or shortness of breath. (Patient not taking: Reported on 03/04/2024)     allopurinol  (ZYLOPRIM ) 100 MG tablet Take 1 tablet by mouth 2 (two) times daily.     amLODipine  (NORVASC ) 10 MG tablet Take 10 mg by mouth daily.     BOTOX 100 units SOLR injection Inject 100 Units into the muscle See admin instructions. Inject 100 units into the eye muscle every 4 months.     glipiZIDE  (GLUCOTROL  XL) 5 MG 24 hr tablet Take 3 tablets (15 mg total) by mouth daily with breakfast. (Patient taking differently: Take 15 mg by mouth daily with breakfast. Pt taking 10 mg) 90 tablet 0   LANTUS SOLOSTAR 100 UNIT/ML Solostar Pen SMARTSIG:10 Unit(s) SUB-Q Daily     latanoprost  (XALATAN ) 0.005 % ophthalmic solution Place 1 drop into both eyes at bedtime.     losartan  (COZAAR ) 25 MG tablet Take 25 mg by mouth daily.     metoprolol  tartrate (LOPRESSOR ) 50 MG tablet Take 75 mg by mouth in the morning and at bedtime.     ondansetron  (ZOFRAN ) 4 MG tablet Take 1 tablet (4 mg total) by mouth every 4 (four) hours as needed for nausea or vomiting. (Patient not taking: Reported on 03/04/2024) 60 tablet 3   warfarin (COUMADIN ) 5 MG tablet Take 1-1.5 tablets (5-7.5 mg total) by mouth See admin instructions. Take 7.5 mg (1.5 tablets) by mouth on Mon/Fri; take 5 mg (1 tablet) by mouth all other days (Sun,  Tues, Wed, Thurs, Sat)      Allergies as of 03/05/2024 - Review Complete 03/05/2024  Allergen Reaction Noted   Lisinopril Cough 04/21/2023     Review of Systems:    Constitutional: No fever or chills Skin: No rash  Cardiovascular: No chest pain Respiratory: No SOB  Gastrointestinal: See HPI and otherwise negative Genitourinary: No dysuria Neurological: No headache, dizziness or syncope Musculoskeletal: No new muscle or joint pain Hematologic: No bleeding  Psychiatric: No history of depression or anxiety    Physical Exam:  Vital signs in last 24 hours: Temp:  [97.9 F (36.6 C)] 97.9 F (36.6 C) (12/12 1246) Pulse Rate:  [97-98] 97 (12/12 1246) Resp:  [20] 20 (12/12 1246) BP: (135-140)/(69-81) 140/69 (12/12 1246) SpO2:  [98 %-100 %] 98 % (12/12 1246)   General:   Pleasant elderly AA male appears to be in NAD, Well developed, thin appearing alert and cooperative Head:  Normocephalic and atraumatic. Eyes:   PEERL, EOMI. No icterus. Conjunctiva pink. Ears:  Normal auditory acuity. Neck:  Supple Throat: Oral cavity and pharynx without inflammation, swelling or lesion.  Lungs: Respirations even and unlabored. Lungs clear to auscultation bilaterally.   No wheezes, crackles, or rhonchi.  Heart: Normal S1, S2. No MRG. Regular rate and rhythm. No peripheral edema, cyanosis or pallor.  Abdomen:  Soft, nondistended, moderate RUQ and RLQ ttp with some involuntary guarding. Normal bowel sounds. No appreciable masses or hepatomegaly. Rectal:  Not performed.  Msk:  Symmetrical without gross deformities. Peripheral pulses intact.  Extremities:  Without edema, no deformity or joint abnormality.  Neurologic:  Alert and  oriented x4;  grossly normal neurologically.  Skin:   Dry and intact without significant lesions or rashes. Psychiatric: Demonstrates good judgement and reason without abnormal affect or behaviors.   LAB RESULTS: Recent Labs    03/05/24 1024  WBC 15.1*  HGB 11.6*   HCT 37.1*  PLT 615*   BMET Recent Labs    03/05/24 1024  NA  140  K 3.4*  CL 102  CO2 27  GLUCOSE 108*  BUN 12  CREATININE 0.86  CALCIUM  9.6   LFT Recent Labs    03/04/24 0930 03/05/24 1024  PROT 6.5 7.3  ALBUMIN 3.5 3.7  AST 60* 78*  ALT 62* 76*  ALKPHOS 664* 853*  BILITOT 0.6 0.5  BILIDIR 0.1  --    STUDIES: MR ABDOMEN WO CONTRAST Result Date: 03/05/2024 EXAM: MRCP WITHOUT IV CONTRAST 03/05/2024 07:41:04 AM TECHNIQUE: Multisequence, multiplanar magnetic resonance images of the abdomen without intravenous contrast. MRCP sequences were performed. COMPARISON: MR Abdomen without and with IV contrast 09/16/2023; 02/24/2023. Clinic note of 03/04/2024 from gastroenterology. CLINICAL HISTORY: Complex history including pancreatitis with known pancreatic mass. Patient not interested in surgical resection. FINDINGS: FINDINGS: Moderate multifocal degradation, including motion and lack of IV contrast. LIVER: Interval development of diffuse T2 hyperintense liver lesions which are most apparent on long b-value diffusion-weighted imaging including 93 / 13. GALLBLADDER AND BILIARY SYSTEM: Moderate gallbladder distention, likely related to biliary obstruction. Development of moderate intrahepatic biliary duct dilatation including on image 18 / 5. The common duct measures 1.6 cm on image 23 / 5 versus normal in caliber on the prior. SPLEEN: Previously described splenic vein thrombus is not well evaluated on noncontrast exam. PANCREAS/PANCREATIC DUCT: Suboptimally evaluated soft tissue fullness within the pancreatic tail including on image 22 / 4 , felt to be increased. Pancreatic duct dilatation at 1.3 cm on image 23 / 5, increased from 9 mm on the prior. Followed to the level of increasing soft tissue fullness in the pancreatic head / uncinate process. Estimated at 3.7 x 3.0 cm on image 28 / 5. Increased from 2.3 x 2.5 cm on the prior exam when re-measured. Suspect mild superimposed peripancreatic  edema. ADRENAL GLANDS: Unremarkable. KIDNEYS: Mild bilateral renal cortical thinning. LYMPH NODES: No enlarged abdominal lymph nodes. VASCULATURE: Normal aortic caliber. Previously described splenic vein thrombus is not well evaluated on noncontrast exam. PERITONEUM: Small volume perihepatic and perisplenic ascites is new. ABDOMINAL WALL: No hernia. No mass. BOWEL: Proximal gastric underdistention. Grossly normal abdominal bowel loops. No bowel obstruction. BONES: No acute abnormality or worrisome osseous lesion. SOFT TISSUES: Unremarkable. MISCELLANEOUS: Small left pleural effusion is new since the prior MRI. IMPRESSION: 1. Moderate multifocal image degradation, including motion and lack of intravenous contrast. 2. Development of large-volume diffuse hepatic metastases. 3. Increase in pancreatic duct caliber and new biliary duct dilatation to the level of increasing soft tissue fullness in the pancreatic head and uncinate process, suspicious for adenocarcinoma. 4. Suggestion of increased soft tissue fullness within the pancreatic tail, which could represent pancreatitis or a second primary carcinoma. 5. New small left pleural effusion and abdominal ascites. 6. Gallbladder distention, likely related to biliary obstruction. Electronically signed by: Rockey Kilts MD 03/05/2024 12:20 PM EST RP Workstation: [id number]   DG Chest Portable 1 View Result Date: 03/05/2024 CLINICAL DATA:  Confusion. EXAM: PORTABLE CHEST 1 VIEW COMPARISON:  04/29/2023 FINDINGS: New left lower lobe opacity is seen, suspicious for pneumonia. Tiny left pleural effusion also noted. Right lung is clear. Heart size is normal. IMPRESSION: New left lower lobe opacity, suspicious for pneumonia. Tiny left pleural effusion. Electronically Signed   By: Norleen DELENA Kil M.D.   On: 03/05/2024 12:09    Impression / Plan:   Impression: 1.  Pancreatic mass: Underwent EUS in June, though suspicious for cancer- biopsies could not prove this at the time,  this lesion continued to grow and last visualized yesterday on MRI/MRCP,  with now new diffuse hepatic metastasis, patient does not want to undergo surgery and does not want to repeat EUS, Dr. San had discussed referring to oncology regardless to see what they could offer, now MRI showing likely common bile duct dilation, concern for blockage due to mass, liver enzymes are elevated compared to a month ago with new liver mets; most likely pancreatic adenocarcinoma with metastasis 2.  Elevated INR: In the setting of warfarin-which does not work as well when the bile duct is obstructed, likely resulting in this acute elevation 3. Liver mets  Plan: 1.  Discussed new finding of liver lesions with the patient and his family.  We do not have a tissue diagnosis of pancreatic adenocarcinoma but this is what is presenting itself.  Per previous discussions with Dr. San the patient and his family are not interested in biopsies or further procedures.  They are intrigued by the prospect of talking to oncology to see what their options would be.  I am not sure this would be very fruitful if we do not have a tissue diagnosis. 2.  For now patient is asymptomatic from a bile duct blockage standpoint.  If he were to become jaundiced or have other sequelae of this may require a bile duct stent in the future.  Discussed this with the patient and his family.  This does not need to be done now.  Will need to monitor symptoms going forward. 3.  Agree with correction of INR.  May need to think about a different anticoagulant going forward. 4.  Patient can have a regular diet.  No plan for procedure this hospitalization. 5.  Patient will need to continue to follow with Dr. San outpatient for further management.  Thank you for your kind consultation, we will sign off.  Delon Gibson Lemmon  03/05/2024, 1:43 PM  GI ATTENDING  History, laboratories, x-rays, extensive GI imaging and procedures all personally  reviewed.  Patient seen and examined as outlined by Delon Failing, PA-C above.  Agree with comprehensive consultation note. Impression: 1.  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  Impending biliary dilation.  However, he does not have jaundice at this time.  It may develop over time, but would not recommend any weaning with ERCP until such were to occur AND palliative intervention was desired. 3.  If for some reason a diagnosis of cancer was important moving forward (am not sure that it is in this 89 year old), I would recommend IR to biopsy his liver.  Otherwise, palliative and supportive care. 4.  Understand that with biliary obstruction, his INR may go up on Coumadin  due to poor absorption of vitamin K .  An alternative anticoagulation agent may be desired. Nothing further to add from GI perspective and no further plans from GI perspective.  Follow-up with Dr. San as needed.  Will sign off.  Thanks  Norleen SAILOR. Abran Raddle., M.D. [facility] of Gastroenterology     [1]  Social History Tobacco Use   Smoking status: Former    Current packs/day: 2.50    Average packs/day: 2.5 packs/day for 40.0 years (100.0 ttl pk-yrs)    Types: Cigarettes   Smokeless tobacco: Never   Tobacco comments:    Pt stated has not smoke in 27 years  Vaping Use   Vaping status: Never Used  Substance Use Topics   Alcohol use: Not Currently    Comment: 1/4 pint a day-liquor.   Drug use: Never

## 2024-03-05 NOTE — H&P (Signed)
 History and Physical    Patient: Christian Crawford FMW:[id number] DOB: 04-10-1933 DOA: 03/05/2024 DOS: the patient was seen and examined on 03/05/2024 PCP: Maree Leni Edyth DELENA, MD  Patient coming from: Home  Chief Complaint:  Chief Complaint  Patient presents with   Abnormal Lab   HPI: Christian Crawford is a 89 y.o. male with medical history significant of paroxysmal atrial fibrillation, history of alcohol abuse, history of alcoholic pancreatitis, diverticulosis/diverticulitis, hematuria, hypertension, history of TIA, syncopal episode who was sent by his primary to the emergency department after his abdominal MRI showed pancreatic mass. He has been having epigastric and upper quadrants abdominal pain associated with a 9 pound weight loss over the last couple of weeks.  According to his daughter, he has been declining for the past 2 months. No nausea, emesis, diarrhea, constipation, melena or hematochezia.  No flank pain, dysuria, frequency or hematuria.   Denied fever, chills, rhinorrhea, sore throat, wheezing or hemoptysis.  He has had dyspnea on exertion is walking home, but denied no chest pain, palpitations, diaphoresis, PND, orthopnea or pitting edema of the lower extremities.  No polyuria, polydipsia, polyphagia or blurred vision.   Lab work: CBC white count 15.1, hemoglobin 11.6 g/dL platelet 384.  PT was 47.7, INR 4.9 and PTT 74.  CMP showed a potassium of 3.4 mmol/L, the rest of the electrolytes and renal function were normal.  Glucose 99 mg/dL.  LFTs show AST of 78, ALT 76 and alkaline phosphatase 853 units/L, protein levels and bilirubin were normal.  Imaging: MRI abdomen without contrast was degraded but showed large volume diffuse hepatic metastasis.  Increasing pancreatic duct caliber and new biliary duct dilatation to the level of increasing soft tissue fullness in the pancreatic head and glycinate process, suspicious for adenocarcinoma.  Tail soft tissue fullness could be due to  pancreatitis or a second primary carcinoma.  New with small left pleural effusion and abdominal ascites.  Gallbladder distention, likely related to biliary obstruction.   ED course: Initial vital signs were temperature 97.9 F, pulse 98, respiration 20, BP 135/81 mmHg and O2 sat 100% on room air.  The patient received 1000 mL of normal saline bolus and 3.375 g of Zosyn  IVPB.  Review of Systems: As mentioned in the history of present illness. All other systems reviewed and are negative.  Past Medical History:  Diagnosis Date   A-fib [facility])    Acute alcoholic pancreatitis 07/24/2019   Diverticulitis 11/13/2021   Heavy alcohol use 07/24/2019   Hematuria 11/14/2021   Hypertension    TIA (transient ischemic attack)    Past Surgical History:  Procedure Laterality Date   EYE SURGERY     had droopy eye   Social History:  reports that he has quit smoking. His smoking use included cigarettes. He has a 100 pack-year smoking history. He has never used smokeless tobacco. He reports that he does not currently use alcohol. He reports that he does not use drugs.  Allergies[1]  Family History  Problem Relation Age of Onset   Colon cancer Brother    Esophageal cancer Neg Hx    Liver disease Neg Hx     Prior to Admission medications  Medication Sig Start Date End Date Taking? Authorizing Provider  albuterol (VENTOLIN HFA) 108 (90 Base) MCG/ACT inhaler Inhale 1-2 puffs into the lungs every 6 (six) hours as needed for wheezing or shortness of breath. Patient not taking: Reported on 03/04/2024 11/12/21   [provider]  allopurinol  (ZYLOPRIM ) 100 MG tablet Take  1 tablet by mouth 2 (two) times daily. 05/18/23   [provider]  amLODipine  (NORVASC ) 10 MG tablet Take 10 mg by mouth daily.    [provider]  BOTOX 100 units SOLR injection Inject 100 Units into the muscle See admin instructions. Inject 100 units into the eye muscle every 4 months.    [provider]   glipiZIDE  (GLUCOTROL  XL) 5 MG 24 hr tablet Take 3 tablets (15 mg total) by mouth daily with breakfast. Patient taking differently: Take 15 mg by mouth daily with breakfast. Pt taking 10 mg 01/30/24 03/04/24  Meredith, Savannah F, PA-C  LANTUS SOLOSTAR 100 UNIT/ML Solostar Pen SMARTSIG:10 Unit(s) SUB-Q Daily 02/25/24   [provider]  latanoprost  (XALATAN ) 0.005 % ophthalmic solution Place 1 drop into both eyes at bedtime. 06/10/19   [provider]  losartan  (COZAAR ) 25 MG tablet Take 25 mg by mouth daily.    [provider]  metoprolol  tartrate (LOPRESSOR ) 50 MG tablet Take 75 mg by mouth in the morning and at bedtime.    [provider]  ondansetron  (ZOFRAN ) 4 MG tablet Take 1 tablet (4 mg total) by mouth every 4 (four) hours as needed for nausea or vomiting. Patient not taking: Reported on 03/04/2024 04/11/23   Cirigliano, Vito V, DO  warfarin (COUMADIN ) 5 MG tablet Take 1-1.5 tablets (5-7.5 mg total) by mouth See admin instructions. Take 7.5 mg (1.5 tablets) by mouth on Mon/Fri; take 5 mg (1 tablet) by mouth all other days (Sun, Tues, Wed, [place], Sat) 05/01/23   Christobal Guadalaj[place], MD    Physical Exam: Vitals:   03/05/24 0933 03/05/24 1246 03/05/24 1425  BP: 135/81 (!) 140/69   Pulse: 98 97 (!) 103  Resp: 20 20   Temp: 97.9 F (36.6 C) 97.9 F (36.6 C)   TempSrc: Oral Oral   SpO2: 100% 98% 99%   Physical Exam Vitals and nursing note reviewed.  Constitutional:      General: He is awake. He is not in acute distress.    Appearance: Normal appearance. He is ill-appearing.  HENT:     Head: Normocephalic.     Nose: No rhinorrhea.     Mouth/Throat:     Mouth: Mucous membranes are moist.  Eyes:     General: No scleral icterus.    Pupils: Pupils are equal, round, and reactive to light.  Neck:     Vascular: No JVD.  Cardiovascular:     Rate and Rhythm: Normal rate and regular rhythm.     Heart sounds: S1 normal and S2 normal.  Pulmonary:     Effort:  Pulmonary effort is normal.     Breath sounds: Normal breath sounds. No wheezing, rhonchi or rales.  Abdominal:     General: Bowel sounds are normal. There is no distension.     Palpations: Abdomen is soft.     Tenderness: There is no abdominal tenderness. There is no right CVA tenderness or left CVA tenderness.  Musculoskeletal:     Cervical back: Neck supple.     Right lower leg: No edema.     Left lower leg: No edema.  Skin:    Findings: Bruising present.  Neurological:     General: No focal deficit present.     Mental Status: He is alert and oriented to person, place, and time.  Psychiatric:        Mood and Affect: Mood normal.        Behavior: Behavior normal. Behavior is  cooperative.     Data Reviewed:  Results are pending, will review when available.  04/24/2023 echocardiogram. IMPRESSIONS:   1. Left ventricular ejection fraction, by estimation, is 65 to 70%. The  left ventricle has normal function. Left ventricular endocardial border  not optimally defined to evaluate regional wall motion. Left ventricular  diastolic parameters are consistent  with Grade I diastolic dysfunction (impaired relaxation).   2. Right ventricular systolic function is normal. The right ventricular  size is normal. Tricuspid regurgitation signal is inadequate for assessing  PA pressure.   3. The mitral valve is degenerative. No evidence of mitral valve  regurgitation. No evidence of mitral stenosis.   4. The aortic valve was not well visualized. Aortic valve regurgitation  is not visualized.   5. The inferior vena cava is normal in size with greater than 50%  respiratory variability, suggesting right atrial pressure of 3 mmHg.    Assessment and Plan: Principal Problem:   Abnormal liver function tests In the setting of:   Biliary obstruction (HCC) Due to:   Pancreatic mass With associated:   Metastasis to liver (HCC) Observation/MedSurg. Continue IV fluids. Keep n.p.o. for  now. Analgesics as needed. Antiemetics as needed. Pantoprazole  40 mg IVP daily. Follow CBC, CMP and lipase in AM. Gastroenterology consult appreciated.  Active Problems:   Hypokalemia Replenishing. Follow potassium level in AM.    Benign essential hypertension Continue metoprolol  75 mg p.o. twice daily. Continue losartan  25 mg p.o. daily.    Paroxysmal atrial fibrillation (HCC) CHA?DS?-VASc Score of at least 7. Not on anticoagulation. Continue metoprolol  for rate control.    Grade I diastolic dysfunction  Continue ARB and beta-blocker.    Chronic obstructive pulmonary disease (HCC) Bronchodilators as needed.    Gastroesophageal reflux disease Antiacid, H2 blocker or PPI as needed.    Hyperlipidemia Therapy contraindicated at the moment.    Thrombocytosis In the setting of:   Normocytic anemia And:   Supratherapeutic INR  Hold warfarin for now. Follow-up PT and INR. Monitor hematocrit and hemoglobin.    Glaucoma suspect Continue Xalatan  drops.    Advance Care Planning:   Code Status: Full Code   Consults: [facility].  Family Communication:   Severity of Illness: The appropriate patient status for this patient is OBSERVATION. Observation status is judged to be reasonable and necessary in order to provide the required intensity of service to ensure the patient's safety. The patient's presenting symptoms, physical exam findings, and initial radiographic and laboratory data in the context of their medical condition is felt to place them at decreased risk for further clinical deterioration. Furthermore, it is anticipated that the patient will be medically stable for discharge from the hospital within 2 midnights of admission.   Author: Alm Dorn Castor, MD 03/05/2024 3:19 PM  For on call review [email or web address].   This document was prepared using Dragon voice recognition software and may contain some unintended transcription errors.     [1]   Allergies Allergen Reactions   Lisinopril Cough

## 2024-03-05 NOTE — ED Notes (Signed)
 Patient attempted to use a urinal for sample, but was unable to urinate. Nurse notified.

## 2024-03-06 DIAGNOSIS — Z794 Long term (current) use of insulin: Secondary | ICD-10-CM | POA: Diagnosis not present

## 2024-03-06 DIAGNOSIS — D630 Anemia in neoplastic disease: Secondary | ICD-10-CM | POA: Diagnosis present

## 2024-03-06 DIAGNOSIS — Z8673 Personal history of transient ischemic attack (TIA), and cerebral infarction without residual deficits: Secondary | ICD-10-CM | POA: Diagnosis not present

## 2024-03-06 DIAGNOSIS — F17210 Nicotine dependence, cigarettes, uncomplicated: Secondary | ICD-10-CM | POA: Diagnosis present

## 2024-03-06 DIAGNOSIS — E1165 Type 2 diabetes mellitus with hyperglycemia: Secondary | ICD-10-CM | POA: Diagnosis present

## 2024-03-06 DIAGNOSIS — Z7901 Long term (current) use of anticoagulants: Secondary | ICD-10-CM | POA: Diagnosis not present

## 2024-03-06 DIAGNOSIS — D75839 Thrombocytosis, unspecified: Secondary | ICD-10-CM | POA: Diagnosis present

## 2024-03-06 DIAGNOSIS — R1084 Generalized abdominal pain: Secondary | ICD-10-CM | POA: Diagnosis present

## 2024-03-06 DIAGNOSIS — E876 Hypokalemia: Secondary | ICD-10-CM | POA: Diagnosis present

## 2024-03-06 DIAGNOSIS — Z800 Family history of malignant neoplasm of digestive organs: Secondary | ICD-10-CM | POA: Diagnosis not present

## 2024-03-06 DIAGNOSIS — J440 Chronic obstructive pulmonary disease with acute lower respiratory infection: Secondary | ICD-10-CM | POA: Diagnosis present

## 2024-03-06 DIAGNOSIS — E785 Hyperlipidemia, unspecified: Secondary | ICD-10-CM | POA: Diagnosis present

## 2024-03-06 DIAGNOSIS — K8689 Other specified diseases of pancreas: Secondary | ICD-10-CM | POA: Diagnosis not present

## 2024-03-06 DIAGNOSIS — R64 Cachexia: Secondary | ICD-10-CM | POA: Diagnosis present

## 2024-03-06 DIAGNOSIS — I480 Paroxysmal atrial fibrillation: Secondary | ICD-10-CM | POA: Diagnosis present

## 2024-03-06 DIAGNOSIS — F1010 Alcohol abuse, uncomplicated: Secondary | ICD-10-CM | POA: Diagnosis present

## 2024-03-06 DIAGNOSIS — Z79899 Other long term (current) drug therapy: Secondary | ICD-10-CM | POA: Diagnosis not present

## 2024-03-06 DIAGNOSIS — J189 Pneumonia, unspecified organism: Secondary | ICD-10-CM | POA: Diagnosis present

## 2024-03-06 DIAGNOSIS — C787 Secondary malignant neoplasm of liver and intrahepatic bile duct: Secondary | ICD-10-CM | POA: Diagnosis present

## 2024-03-06 DIAGNOSIS — I10 Essential (primary) hypertension: Secondary | ICD-10-CM | POA: Diagnosis present

## 2024-03-06 DIAGNOSIS — K219 Gastro-esophageal reflux disease without esophagitis: Secondary | ICD-10-CM | POA: Diagnosis present

## 2024-03-06 DIAGNOSIS — C259 Malignant neoplasm of pancreas, unspecified: Secondary | ICD-10-CM | POA: Diagnosis present

## 2024-03-06 DIAGNOSIS — Z515 Encounter for palliative care: Secondary | ICD-10-CM | POA: Diagnosis not present

## 2024-03-06 DIAGNOSIS — Z66 Do not resuscitate: Secondary | ICD-10-CM | POA: Diagnosis not present

## 2024-03-06 DIAGNOSIS — J90 Pleural effusion, not elsewhere classified: Secondary | ICD-10-CM | POA: Diagnosis present

## 2024-03-06 DIAGNOSIS — Z7984 Long term (current) use of oral hypoglycemic drugs: Secondary | ICD-10-CM | POA: Diagnosis not present

## 2024-03-06 DIAGNOSIS — Z7189 Other specified counseling: Secondary | ICD-10-CM | POA: Diagnosis not present

## 2024-03-06 LAB — BASIC METABOLIC PANEL WITH GFR
Anion gap: 9 (ref 5–15)
BUN: 9 mg/dL (ref 8–23)
CO2: 23 mmol/L (ref 22–32)
Calcium: 8.9 mg/dL (ref 8.9–10.3)
Chloride: 105 mmol/L (ref 98–111)
Creatinine, Ser: 0.77 mg/dL (ref 0.61–1.24)
GFR, Estimated: >60 mL/min (ref >60)
Glucose, Bld: 112 mg/dL — ABNORMAL HIGH (ref 70–99)
Potassium: 4 mmol/L (ref 3.5–5.1)
Sodium: 138 mmol/L (ref 135–145)

## 2024-03-06 LAB — COMPREHENSIVE METABOLIC PANEL WITH GFR
ALT: 84 U/L — ABNORMAL HIGH (ref 0–44)
AST: 105 U/L — ABNORMAL HIGH (ref 15–41)
Albumin: 3.1 g/dL — ABNORMAL LOW (ref 3.5–5.0)
Alkaline Phosphatase: 917 U/L — ABNORMAL HIGH (ref 38–126)
Anion gap: 10 (ref 5–15)
BUN: 9 mg/dL (ref 8–23)
CO2: 23 mmol/L (ref 22–32)
Calcium: 8.7 mg/dL — ABNORMAL LOW (ref 8.9–10.3)
Chloride: 107 mmol/L (ref 98–111)
Creatinine, Ser: 0.78 mg/dL (ref 0.61–1.24)
GFR, Estimated: >60 mL/min (ref >60)
Glucose, Bld: 134 mg/dL — ABNORMAL HIGH (ref 70–99)
Potassium: 4.1 mmol/L (ref 3.5–5.1)
Sodium: 140 mmol/L (ref 135–145)
Total Bilirubin: 0.4 mg/dL (ref 0.0–1.2)
Total Protein: 6.2 g/dL — ABNORMAL LOW (ref 6.5–8.1)

## 2024-03-06 LAB — CBC
HCT: 33.8 % — ABNORMAL LOW (ref 39.0–52.0)
HCT: 34.8 % — ABNORMAL LOW (ref 39.0–52.0)
Hemoglobin: 10.8 g/dL — ABNORMAL LOW (ref 13.0–17.0)
Hemoglobin: 10.9 g/dL — ABNORMAL LOW (ref 13.0–17.0)
MCH: 26 pg (ref 26.0–34.0)
MCH: 25.7 pg — ABNORMAL LOW (ref 26.0–34.0)
MCHC: 32 g/dL (ref 30.0–36.0)
MCHC: 31.3 g/dL (ref 30.0–36.0)
MCV: 81.4 fL (ref 80.0–100.0)
MCV: 82.1 fL (ref 80.0–100.0)
Platelets: 524 K/uL — ABNORMAL HIGH (ref 150–400)
Platelets: 568 K/uL — ABNORMAL HIGH (ref 150–400)
RBC: 4.15 MIL/uL — ABNORMAL LOW (ref 4.22–5.81)
RBC: 4.24 MIL/uL (ref 4.22–5.81)
RDW: 14.2 % (ref 11.5–15.5)
RDW: 14.5 % (ref 11.5–15.5)
WBC: 13.1 K/uL — ABNORMAL HIGH (ref 4.0–10.5)
WBC: 14.1 K/uL — ABNORMAL HIGH (ref 4.0–10.5)
nRBC: 0 % (ref 0.0–0.2)
nRBC: 0 % (ref 0.0–0.2)

## 2024-03-06 LAB — GLUCOSE, CAPILLARY
Glucose-Capillary: 211 mg/dL — ABNORMAL HIGH (ref 70–99)
Glucose-Capillary: 83 mg/dL (ref 70–99)

## 2024-03-06 LAB — PROTIME-INR
INR: 5.7 (ref 0.8–1.2)
Prothrombin Time: 53.7 s — ABNORMAL HIGH (ref 11.4–15.2)

## 2024-03-06 LAB — CANCER ANTIGEN 19-9: CA 19-9: 31407 U/mL — ABNORMAL HIGH (ref <34)

## 2024-03-06 MED ORDER — PHYTONADIONE 5 MG PO TABS
5.0000 mg | ORAL_TABLET | Freq: Once | ORAL | Status: AC
  Administered 2024-03-06: 5 mg via ORAL
  Filled 2024-03-06: qty 1

## 2024-03-06 MED ORDER — OXYCODONE HCL 5 MG PO TABS
5.0000 mg | ORAL_TABLET | ORAL | Status: DC | PRN
  Administered 2024-03-07 – 2024-03-08 (×2): 5 mg via ORAL
  Filled 2024-03-06 (×2): qty 1

## 2024-03-06 MED ORDER — INSULIN ASPART 100 UNIT/ML IJ SOLN
0.0000 [IU] | Freq: Three times a day (TID) | INTRAMUSCULAR | Status: DC
  Administered 2024-03-06: 5 [IU] via SUBCUTANEOUS
  Administered 2024-03-07: 2 [IU] via SUBCUTANEOUS
  Filled 2024-03-06: qty 2
  Filled 2024-03-06: qty 5

## 2024-03-06 MED ORDER — ENSURE PLUS HIGH PROTEIN PO LIQD: 237.0000 mL | Freq: Two times a day (BID) | ORAL | Status: DC

## 2024-03-06 NOTE — Progress Notes (Signed)
 Date and time results received: 03/06/2024 10:49 AM  (use smartphrase .now to insert current time)  Test: INR   Critical Value: 5.7  Name of Provider Notified: Tobie

## 2024-03-06 NOTE — Progress Notes (Signed)
 Pt presenting with new onset hematuria. Approximately blood tinged urine. No c/o pain or discomfort while urinating. MD aware, labs ordered.

## 2024-03-06 NOTE — Progress Notes (Addendum)
 [facility] Progress Note Patient: Christian Crawford FMW:[id number] DOB: Jun 24, 1933  DOA: 03/05/2024 DOS: the patient was seen and examined on 03/06/2024  Brief Hospital Course: PMH of P A-fib, HTN, on Coumadin , type II DM alcohol abuse with pancreatitis. Went to PCPs office for blood work and was found to have elevated INR 1 is asked to come to the ER for further workup. For past few days he has been more lethargic and confused at home. For past few months he has lost 9 to 10 pounds.  And has been complaining of right-sided pain. Has seen GI for this in the past.  Imaging has showed that he has a pancreatic mass.  Which is known throughout 2025.  And had an EUS in 06/12/2023 which was inconclusive.  CA 19-9 is pending. Underwent MRCP.  Soft tissue fullness in pancreatic head and uncinate process concerning for adenocarcinoma.  Pancreatic tail soft tissue fullness concern for carcinoma.  Diffuse hepatic metastatic appearing lesions.  Increasing pancreatic duct and biliary duct dilation.  Distended gallbladder.  GI was consulted. Findings concern for pancreatic adenocarcinoma with metastasis.  Currently no indication for ERCP for stenting unless develops symptoms of obstructive jaundice.  Assessment and Plan: Pancreatic mass concern for pancreatic adenocarcinoma. Liver lesion concerning for metastatis Pancreatic duct dilation, biliary dilation. History of EUS in March which was inconclusive. Outpatient CA 19-9 currently pending. GI evaluated the patient and at present does not think the patient require biliary stenting given lack of jaundice. Most likely presentation is secondary to metastatic pancreatic cancer. Per family GI have informed that repeating EUS will be high risk for the patient. Patient and family also not interested in aggressive surgical interventions. GI recommend outpatient follow-up for now. Patient is referred to oncology outpatient. Will continue to treat pain  with pain medication and symptomatic therapy for now.  Left lower lobe pneumonia. Left pleural effusion. Primarily presented with poor p.o. intake and abnormal INR. Found to have a low-grade temperature. Has some leukocytosis. X-ray shows left lower lobe pneumonia. Started on IV antibiotic. For now we will continue IV Zosyn . Blood cultures performed.  So far negative.  Goals of care conversation. Patient has been informed by GI that procedures like ERCP and EUS will be relatively high risk. At baseline patient is minimally active, nearly bedbound. Patient with history of chronic pancreatitis now with pancreatic mass with weight loss. GI recommended palliative and supportive care.. Oncology referral is placed but without tissue diagnosis I am unsure if they can provide any treatment guidance. INR remains elevated for now for any kind of intervention/biopsy. Given patient's metastatic appearance on the MRCP, patient is not a surgical candidate for pancreatic malignancy. If it is metastatic pancreatic cancer, prognosis is 9 to 11 months with chemotherapy and may be shorter without chemotherapy. Patient does not have good performance status per family thus may not be an adequate candidate for chemotherapy as well. All this renders poor prognosis. Family understands this very well as well as patient. At present the desire is to treat what is treatable in the hospital, stabilize the patient and focus on comfort upon discharge. Discuss advance CODE STATUS, currently with metastatic pancreatic malignancy and resultant nutritional status, prognosis is guarded and therefore recommended DNR which the agrees to. Will consult palliative care to continue further conversation with the family on an ongoing basis to see if they would like to arrange hospice upon discharge. For now continue with antibiotics and vitamin K  therapy for INR.  HTN. Blood pressure stable. Continue  current  medication.  Hypokalemia. Replaced.  Paroxysmal A-fib. Currently rate controlled. On warfarin for anticoagulation with supratherapeutic INR. Receiving vitamin K  therapy for now.  COPD. Continue exacerbation. Monitor.  Type 2 diabetes mellitus, uncontrolled with hyperglycemia with long-term insulin  use. Currently on sliding scale insulin . Monitor. Check A1c.  History of gout. On allopurinol . Continue.  Subjective: Denies any acute complaint.  Tells me I am getting there.  No nausea or vomiting.  Still has some abdominal pain more on the right side.  Passing gas but no BM.  No chest pain no shortness of breath no headache.  No dizziness. Family reports on and off confusion although currently close to baseline.  Physical Exam: General: in Mild distress, No Rash Cardiovascular: S1 and S2 Present, No Murmur Respiratory: Good respiratory effort, Bilateral Air entry present.  Basal crackles, No wheezes Abdomen: Bowel Sound present, right upper quadrant tenderness Extremities: Trace edema Neuro: Alert and oriented x3, no new focal deficit, no asterixis some tremor seen though.  Data Reviewed: I have Reviewed nursing notes, Vitals, and Lab results. Since last encounter, pertinent lab results CBC and BMP   . I have ordered test including CBC and BMP  .  Consulted palliative care.  Disposition: Status is: Inpatient Remains inpatient appropriate because: Monitor for improvement in pain and further workup for infection.   Family Communication: Family member at bedside. Level of care: Telemetry continue Vitals:   03/06/24 0123 03/06/24 0522 03/06/24 0836 03/06/24 1422  BP: 118/67 129/71  133/70  Pulse: 81 88  80  Resp: 20 20  16   Temp: (!) 100.4 F (38 C) 98.6 F (37 C)  99.3 F (37.4 C)  TempSrc: Oral Oral    SpO2: 98% 97%  96%  Weight:   72.1 kg   Height:   6' (1.829 m)      Author: Yetta Blanch, MD 03/06/2024 4:36 PM  Please look on [email or web address] to find out  who is on call.

## 2024-03-06 NOTE — Plan of Care (Signed)

## 2024-03-06 NOTE — Care Management Obs Status (Signed)
 MEDICARE OBSERVATION STATUS NOTIFICATION   Patient Details  Name: Christian Crawford MRN: [id number] Date of Birth: 27-Sep-1933   Medicare Observation Status Notification Given:  Yes    Satine Hausner A Campbell Kray, LCSW 03/06/2024, 10:47 AM

## 2024-03-07 LAB — CBC WITH DIFFERENTIAL/PLATELET
Abs Immature Granulocytes: 0.05 K/uL (ref 0.00–0.07)
Basophils Absolute: 0.1 K/uL (ref 0.0–0.1)
Basophils Relative: 0 %
Eosinophils Absolute: 0.8 K/uL — ABNORMAL HIGH (ref 0.0–0.5)
Eosinophils Relative: 6 %
HCT: 37.2 % — ABNORMAL LOW (ref 39.0–52.0)
Hemoglobin: 12 g/dL — ABNORMAL LOW (ref 13.0–17.0)
Immature Granulocytes: 0 %
Lymphocytes Relative: 13 %
Lymphs Abs: 1.7 K/uL (ref 0.7–4.0)
MCH: 26.4 pg (ref 26.0–34.0)
MCHC: 32.3 g/dL (ref 30.0–36.0)
MCV: 81.9 fL (ref 80.0–100.0)
Monocytes Absolute: 1.5 K/uL — ABNORMAL HIGH (ref 0.1–1.0)
Monocytes Relative: 11 %
Neutro Abs: 9.3 K/uL — ABNORMAL HIGH (ref 1.7–7.7)
Neutrophils Relative %: 70 %
Platelets: 581 K/uL — ABNORMAL HIGH (ref 150–400)
RBC: 4.54 MIL/uL (ref 4.22–5.81)
RDW: 14.5 % (ref 11.5–15.5)
WBC: 13.4 K/uL — ABNORMAL HIGH (ref 4.0–10.5)
nRBC: 0 % (ref 0.0–0.2)

## 2024-03-07 LAB — COMPREHENSIVE METABOLIC PANEL WITH GFR
ALT: 76 U/L — ABNORMAL HIGH (ref 0–44)
AST: 97 U/L — ABNORMAL HIGH (ref 15–41)
Albumin: 3.1 g/dL — ABNORMAL LOW (ref 3.5–5.0)
Alkaline Phosphatase: 865 U/L — ABNORMAL HIGH (ref 38–126)
Anion gap: 10 (ref 5–15)
BUN: 9 mg/dL (ref 8–23)
CO2: 23 mmol/L (ref 22–32)
Calcium: 9 mg/dL (ref 8.9–10.3)
Chloride: 105 mmol/L (ref 98–111)
Creatinine, Ser: 0.78 mg/dL (ref 0.61–1.24)
GFR, Estimated: >60 mL/min (ref >60)
Glucose, Bld: 75 mg/dL (ref 70–99)
Potassium: 4.1 mmol/L (ref 3.5–5.1)
Sodium: 138 mmol/L (ref 135–145)
Total Bilirubin: 0.6 mg/dL (ref 0.0–1.2)
Total Protein: 6.2 g/dL — ABNORMAL LOW (ref 6.5–8.1)

## 2024-03-07 LAB — PROTIME-INR
INR: 5.7 (ref 0.8–1.2)
INR: 2.4 — ABNORMAL HIGH (ref 0.8–1.2)
Prothrombin Time: 53.4 s — ABNORMAL HIGH (ref 11.4–15.2)
Prothrombin Time: 27.5 s — ABNORMAL HIGH (ref 11.4–15.2)

## 2024-03-07 LAB — GLUCOSE, CAPILLARY
Glucose-Capillary: 78 mg/dL (ref 70–99)
Glucose-Capillary: 122 mg/dL — ABNORMAL HIGH (ref 70–99)
Glucose-Capillary: 92 mg/dL (ref 70–99)
Glucose-Capillary: 93 mg/dL (ref 70–99)

## 2024-03-07 LAB — HEMOGLOBIN A1C
Hgb A1c MFr Bld: 10.4 % — ABNORMAL HIGH (ref 4.8–5.6)
Mean Plasma Glucose: 251.78 mg/dL

## 2024-03-07 LAB — MAGNESIUM: Magnesium: 2.2 mg/dL (ref 1.7–2.4)

## 2024-03-07 MED ORDER — PHYTONADIONE 5 MG PO TABS
5.0000 mg | ORAL_TABLET | Freq: Once | ORAL | Status: AC
  Administered 2024-03-07: 5 mg via ORAL
  Filled 2024-03-07: qty 1

## 2024-03-07 NOTE — Consult Note (Signed)
 Palliative Care Consult Note                                  Date: 03/07/2024   Patient Name: Christian Crawford  DOB: [birth year]  MRN: [id number]  Age / Sex: 89 y.o., male  PCP: Maree Leni Edyth DELENA, MD Referring Physician: Zella Katha HERO, MD  Reason for Consultation: Establishing goals of care  HPI/Patient Profile: 89 y.o. male  with past medical history of A-fib on coumadin , HTN, DM2, TIA, alcohol abuse, and alcoholic pancreatitis, and pancreatic mass admitted on 03/05/2024 after his primary care requested he come to the emergency department for further workup due to elevated INR.  Patient underwent MRCP. Patient's pancreatic mass is concerning for pancreatic adenocarcinoma with liver lesion concerning for metastasis. Patient and family understand patient is not  Past Medical History:  Diagnosis Date   A-fib [facility])    Acute alcoholic pancreatitis 07/24/2019   Diverticulitis 11/13/2021   Heavy alcohol use 07/24/2019   Hematuria 11/14/2021   Hypertension    TIA (transient ischemic attack)     Subjective:   I have reviewed medical records including EPIC notes, labs and imaging, received updates from nursing and attending provider, assessed the patient and then met with the patient and three of his daughters to discuss diagnosis prognosis, GOC, EOL wishes, disposition and options.  I introduced Palliative Medicine as specialized medical care for people living with serious illness. It focuses on providing relief from symptoms and stress of a serious illness. The goal is to improve quality of life for both the patient and the family.  Today's Discussion: Patient sitting up in bed with three of his five daughters at bedside Genette, [name], and Harding). We discussed the patient's chronic conditions and acute hospitalization. Patient and family had a discussion with attending provider yesterday. Patient and family understand patient is not a  candidate for surgical intervention or other cancer therapies. They do not believe any further workup or therapies would be consistent with his goals of care and the quality of life he desires. Family would like to discuss options moving forward.  Prior to this admission the patient lived with his daughter [name]. Patient is widowed and has five daughters. Recently the patient was independent in ADLs and received some help from his children with IADLs. He was able to help with cooking and carrying our the trash. The daughters share his functional status has worsened. Over the last weeks he has had increasing right sided pain and has been more lethargic.   The patient has a HCPOA document but it is not in [facility]- asked daughters to bring for review. The patient's daughter [name] is listed as HCPOA but the patient and his children make decisions together. Today he tells me that he would like his three children present today to make the decisions regarding next steps. Confirmed DNR/DNI. We discussed the difference between an aggressive medical intervention path and a comfort focused path. The family would like to  transition to comfort focused measures once the patient is discharged.   We discussed that once a patient is on comfort focused care the patient would no longer receive aggressive medical interventions such as continuous vital signs, lab work, radiology testing, or medications not focused on comfort. All care would focus on how the patient is looking and feeling. This would include management of any symptoms that may cause discomfort, pain, shortness of breath, cough,  nausea, agitation, anxiety, and/or secretions etc. Symptoms would be managed with medications and other non-pharmacological interventions. We discussed hospice philosophy and available locations for hospice care. Patient and family would like to discharge home with hospice with [facility] services. Notified TOC and hospice  liaison.  Emotional support and therapeutic listening provided. Questions and concerns were addressed. Hard Choices booklet emailed for review. The family was encouraged to call with questions or concerns. PMT will continue to support holistically.  Review of Systems  Gastrointestinal:  Positive for abdominal pain.    Objective:   Primary Diagnoses: Present on Admission:  Pancreatic mass  Paroxysmal atrial fibrillation (HCC)  Benign essential hypertension  Metastasis to liver (HCC)  Biliary obstruction (HCC)  Abnormal liver function tests  Hyperlipidemia  Chronic obstructive pulmonary disease (HCC)  Gastroesophageal reflux disease  Hypokalemia  Thrombocytosis  Normocytic anemia  Supratherapeutic INR  Glaucoma suspect   Physical Exam Vitals reviewed.  Constitutional:      General: He is not in acute distress.    Appearance: He is ill-appearing.  HENT:     Head: Normocephalic and atraumatic.  Cardiovascular:     Rate and Rhythm: Normal rate.  Pulmonary:     Effort: Pulmonary effort is normal.  Skin:    General: Skin is warm and dry.  Neurological:     Mental Status: He is alert and oriented to person, place, and time.  Psychiatric:        Mood and Affect: Mood normal.        Behavior: Behavior normal.     Vital Signs:  BP 128/81   Pulse 85   Temp 98.8 F (37.1 C) (Oral)   Resp 18   Ht 6' (1.829 m)   Wt 72.1 kg   SpO2 99%   BMI 21.56 kg/m    Advanced Care Planning:   Existing Vynca/ACP Documentation: None  Primary Decision Maker: PATIENT  Code Status/Advance Care Planning: DNR  Assessment & Plan:   SUMMARY OF RECOMMENDATIONS   Continue DNR/DNI Transition to comfort measures once home Discharge home with hospice services through [facility]-  notified TOC and ACC liaison Continued PMT support  Discussed with: bedside RN, SW, ACC liaison, and Dr. Zella  Time Total: 75 minutes    Thank you for allowing us  to participate in the care of  Christian Crawford PMT will continue to support holistically.    Signed by: Stephane Palin, NP Palliative Medicine Team  Team Phone # [phone] (Nights/Weekends)  03/07/2024, 11:02 AM

## 2024-03-07 NOTE — TOC Progression Note (Addendum)
 Transition of Care [facility]) - Progression Note    Patient Details  Name: Christian Crawford MRN: [id number] Date of Birth: [birth year]  Transition of Care [facility]) CM/SW Contact  Heather DELENA Saltness, LCSW Phone Number: 03/07/2024, 11:49 AM  Clinical Narrative:     ADDENDUM  Pt to discharge home with hospice services tomorrow 12/15 through [facility].  Per palliative care team, pt and family would like to pursue home hospice services through [facility]. Referral made to [facility]. Hospital liaison, Nat, reports ability to accept pt for services. TOC will continue to follow.   Expected Discharge Plan and Services In-house Referral: Clinical Social Work Home with hospice services   Social Drivers of Health (SDOH) Interventions SDOH Screenings   Food Insecurity: No Food Insecurity (03/05/2024)  Housing: Low Risk (03/05/2024)  Transportation Needs: No Transportation Needs (03/05/2024)  Utilities: Not At Risk (03/05/2024)  Social Connections: Moderately Isolated (03/05/2024)  Tobacco Use: Medium Risk (03/05/2024)    Readmission Risk Interventions    03/07/2024   11:46 AM 05/01/2023    9:34 AM 04/24/2023    2:27 PM  Readmission Risk Prevention Plan  Transportation Screening Complete Complete Complete  PCP or Specialist Appt within 5-7 Days   Complete  PCP or Specialist Appt within 3-5 Days Complete    Home Care Screening   Complete  Medication Review (RN CM)   Complete  HRI or Home Care Consult Complete    Social Work Consult for Recovery Care Planning/Counseling Complete    Palliative Care Screening Complete    Medication Review [employer]) Complete      Signed: Heather Saltness, MSW, LCSW Clinical Social Worker Inpatient Care Management 03/07/2024 11:49 AM

## 2024-03-07 NOTE — Progress Notes (Signed)
 Christian Crawford [phone] [facility] Liaison Note   Received request from [place], [facility], for hospice services at home after discharge. Spoke with patient and daughters [name] and [name] at bedside to initiate education related to hospice philosophy, services, and team approach to care. Family verbalized understanding of information given. Per discussion, discharge plan is uncertain. Also waiting for PT evaluation to see what patient mobility will be.    DME needs discussed. Patient has the following equipment in the home: walker and wheelchair.  Patient/family requests the following equipment for delivery: none at this time   The address has been verified and is correct in the chart. [name] is the family contact.    Please send signed and completed DNR home with patient/family. Please provide prescriptions at discharge as needed to ensure ongoing symptom management.   [facility] information and contact numbers given to [name] and [name].   Please call with any questions or concerns. Thank you for the opportunity to participate in this patient's care.   Nat Babe, BSN, RN [employer] [phone]

## 2024-03-07 NOTE — Plan of Care (Signed)
°  Problem: Education: Goal: Knowledge of General Education information will improve Description: Including pain rating scale, medication(s)/side effects and non-pharmacologic comfort measures Outcome: Progressing   Problem: Health Behavior/Discharge Planning: Goal: Ability to manage health-related needs will improve Outcome: Progressing   Problem: Activity: Goal: Risk for activity intolerance will decrease Outcome: Progressing   Problem: Nutrition: Goal: Adequate nutrition will be maintained Outcome: Progressing   Problem: Elimination: Goal: Will not experience complications related to urinary retention Outcome: Progressing   Problem: Pain Managment: Goal: General experience of comfort will improve and/or be controlled Outcome: Progressing   Problem: Safety: Goal: Ability to remain free from injury will improve Outcome: Progressing   Problem: Coping: Goal: Ability to adjust to condition or change in health will improve Outcome: Progressing   Problem: Metabolic: Goal: Ability to maintain appropriate glucose levels will improve Outcome: Progressing

## 2024-03-07 NOTE — Progress Notes (Signed)
 [facility] Progress Note Patient: Christian Crawford FMW:[id number] DOB: [birth year]  DOA: 03/05/2024 DOS: the patient was seen and examined on 03/07/2024  Brief Hospital Course: PMH of P A-fib, HTN, on Coumadin , type II DM alcohol abuse with pancreatitis. Went to PCPs office for blood work and was found to have elevated INR 1 is asked to come to the ER for further workup. For past few days he has been more lethargic and confused at home. For past few months he has lost 9 to 10 pounds.  And has been complaining of right-sided pain. Has seen GI for this in the past.  Imaging has showed that he has a pancreatic mass.  Which is known throughout 2025.  And had an EUS in 06/12/2023 which was inconclusive.  CA 19-9 is pending. Underwent MRCP.  Soft tissue fullness in pancreatic head and uncinate process concerning for adenocarcinoma.  Pancreatic tail soft tissue fullness concern for carcinoma.  Diffuse hepatic metastatic appearing lesions.  Increasing pancreatic duct and biliary duct dilation.  Distended gallbladder. [facility] was consulted. Findings concern for pancreatic adenocarcinoma with metastasis.  Currently no indication for ERCP for stenting unless develops symptoms of obstructive jaundice. GI has signed off, changed to DNR CODE STATUS, awaiting palliative care and likely home with hospice.  Assessment and Plan: Pancreatic mass concern for pancreatic adenocarcinoma. Liver lesion concerning for metastatis Pancreatic duct dilation, biliary dilation. History of EUS in March which was inconclusive. Outpatient CA 19-9 currently pending. GI evaluated the patient and at present does not think the patient require biliary stenting given lack of jaundice. Most likely presentation is secondary to metastatic pancreatic cancer. Per family GI have informed that repeating EUS will be high risk for the patient. Patient and family also not interested in aggressive surgical interventions. GI recommend  outpatient follow-up for now. Patient is referred to oncology outpatient but unlikely to be very helpful without tissue diagnosis. Will continue to treat pain with pain medication and symptomatic therapy for now patient and family are interested in palliative care and potentially hospice at home.  Left lower lobe pneumonia. Left pleural effusion. Primarily presented with poor p.o. intake and abnormal INR. Found to have a low-grade temperature. Has some leukocytosis. X-ray shows left lower lobe pneumonia. Started on IV antibiotic. For now we will continue IV Zosyn . Blood cultures performed.  So far negative.  Goals of care conversation. Patient has been informed by GI that procedures like ERCP and EUS will be relatively high risk. At baseline patient is minimally active, nearly bedbound. Patient with history of chronic pancreatitis now with pancreatic mass with weight loss. GI recommended palliative and supportive care.. Oncology referral is placed but without tissue diagnosis I am unsure if they can provide any treatment guidance. INR remains elevated for now for any kind of intervention/biopsy. Given patient's metastatic appearance on the MRCP, patient is not a surgical candidate for pancreatic malignancy. If it is metastatic pancreatic cancer, prognosis is 9 to 11 months with chemotherapy and may be shorter without chemotherapy. Patient does not have good performance status per family thus may not be an adequate candidate for chemotherapy as well. All this renders poor prognosis. Family understands this very well as well as patient. At present the desire is to treat what is treatable in the hospital, stabilize the patient and focus on comfort upon discharge.  Discuss advance CODE STATUS, currently with metastatic pancreatic malignancy and resultant nutritional status, prognosis is guarded and therefore recommended DNR which the agrees to.  Will consult  palliative care to continue  further conversation with the family on an ongoing basis to see if they would like to arrange hospice upon discharge. For now continue with antibiotics and vitamin K  therapy for INR which will monitor daily  HTN. Blood pressure stable. Continue current medication.  Hypokalemia. Replaced.  Paroxysmal A-fib. Currently rate controlled. On warfarin for anticoagulation with supratherapeutic INR. Receiving vitamin K  therapy for now, monitor INR daily  COPD. Continue exacerbation. Monitor.  Type 2 diabetes mellitus, uncontrolled with hyperglycemia with long-term insulin  use. Currently on sliding scale insulin . Monitor. Check A1c.  History of gout. On allopurinol . Continue.  Subjective: Denies any acute complaint, his 3 daughters are at the bedside.  They mention that he had a little bit of gross hematuria last night, but no issues with retention.  No chest pain or any other concerns.  Patient admits to poor appetite but he is eating.  Physical Exam: General: in Mild distress, No Rash, his 3 daughters are at the bedside. Cardiovascular: S1 and S2 Present, No Murmur Respiratory: Good respiratory effort, Bilateral Air entry present.  Basal crackles, No wheezes Abdomen: Bowel Sound present, right upper quadrant tenderness Extremities: Trace edema Neuro: Alert and oriented x3, no new focal deficit, no asterixis some tremor seen though.  Data Reviewed: I have Reviewed nursing notes, Vitals, and Lab results. Since last encounter, pertinent lab results CBC and BMP   . I have ordered test including CBC and BMP  .  Consulted palliative care.  Disposition: Status is: Inpatient Remains inpatient appropriate because: Monitor for improvement in pain and further workup for infection.   Family Communication: Family member at bedside. Level of care: Telemetry continue Vitals:   03/06/24 0836 03/06/24 1422 03/06/24 2119 03/07/24 0431  BP:  133/70 128/74 128/81  Pulse:  80 93 85  Resp:  16   18  Temp:  99.3 F (37.4 C) 98.5 F (36.9 C) 98.8 F (37.1 C)  TempSrc:   Oral Oral  SpO2:  96% 96% 99%  Weight: 72.1 kg     Height: 6' (1.829 m)        Author: Omir Cooprider CHRISTELLA Gail, MD 03/07/2024 8:49 AM  Please look on [email or web address] to find out who is on call.

## 2024-03-07 NOTE — Evaluation (Signed)
 Physical Therapy Evaluation Patient Details Name: Christian Crawford MRN: [id number] DOB: May 29, 1933 Today's Date: 03/07/2024  History of Present Illness  Pt admitted from home 2* elevated LFTs and bilary instruction 2* pancreatic mass with liver mets.  Pt with hx of TIA, Afib, ETOH abuse and alcoholic pancreatitis  Clinical Impression  Pt admitted as above and presenting with functional mobility limitations 2* generalized weakness, residual R flank pain and ambulatory balance deficits.  Pt motivate and should progress to dc home with family assist.        If plan is discharge home, recommend the following: A little help with walking and/or transfers;A little help with bathing/dressing/bathroom;Assistance with cooking/housework;Assist for transportation;Help with stairs or ramp for entrance   Can travel by private vehicle        Equipment Recommendations None recommended by PT  Recommendations for Other Services       Functional Status Assessment Patient has had a recent decline in their functional status and demonstrates the ability to make significant improvements in function in a reasonable and predictable amount of time.     Precautions / Restrictions Precautions Precautions: Fall Restrictions Weight Bearing Restrictions Per Provider Order: No      Mobility  Bed Mobility Overal bed mobility: Modified Independent             General bed mobility comments: no assist to move to EOB sitting    Transfers Overall transfer level: Needs assistance Equipment used: Rolling walker (2 wheels) Transfers: Sit to/from Stand Sit to Stand: Min assist           General transfer comment: cues for LE management and use of UEs to self assist    Ambulation/Gait Ambulation/Gait assistance: Contact guard assist Gait Distance (Feet): 200 Feet Assistive device: Rolling walker (2 wheels) Gait Pattern/deviations: Step-to pattern, Step-through pattern, Decreased step length - right,  Decreased step length - left, Shuffle, Trunk flexed       General Gait Details: cues for posture, position from [employer]            Wheelchair Mobility     Tilt Bed    Modified Rankin (Stroke Patients Only)       Balance Overall balance assessment: Needs assistance Sitting-balance support: No upper extremity supported, Feet supported Sitting balance-Leahy Scale: Good     Standing balance support: Single extremity supported Standing balance-Leahy Scale: Poor                               Pertinent Vitals/Pain Pain Assessment Pain Assessment: 0-10 Pain Score: 4  Pain Location: R flank Pain Descriptors / Indicators: Sore Pain Intervention(s): Limited activity within patient's tolerance, Monitored during session    Home Living Family/patient expects to be discharged to:: Private residence Living Arrangements: Other relatives Available Help at Discharge: Family Type of Home: House Home Access: Stairs to enter Entrance Stairs-Rails: Doctor, General Practice of Steps: 5   Home Layout: One level Home Equipment: Agricultural Consultant (2 wheels);Cane - single point Additional Comments: lives with dtr    Prior Function Prior Level of Function : Independent/Modified Independent             Mobility Comments: pt is normally independent--has RW and cane but does not use       Extremity/Trunk Assessment   Upper Extremity Assessment Upper Extremity Assessment: Generalized weakness    Lower Extremity Assessment Lower Extremity Assessment: Generalized weakness    Cervical / Trunk  Assessment Cervical / Trunk Assessment: Kyphotic  Communication   Communication Communication: No apparent difficulties    Cognition Arousal: Alert Behavior During Therapy: WFL for tasks assessed/performed   PT - Cognitive impairments: No apparent impairments                         Following commands: Intact       Cueing Cueing Techniques:  Verbal cues     General Comments      Exercises     Assessment/Plan    PT Assessment Patient needs continued PT services  PT Problem List Decreased strength;Decreased range of motion;Decreased activity tolerance;Decreased balance;Decreased mobility;Decreased knowledge of use of DME;Pain       PT Treatment Interventions DME instruction;Gait training;Stair training;Functional mobility training;Therapeutic activities;Therapeutic exercise;Balance training;Patient/family education    PT Goals (Current goals can be found in the Care Plan section)  Acute Rehab PT Goals Patient Stated Goal: REgain IND PT Goal Formulation: With patient Time For Goal Achievement: 03/21/24 Potential to Achieve Goals: Good    Frequency Min 2X/week     Co-evaluation               AM-PAC PT 6 Clicks Mobility  Outcome Measure Help needed turning from your back to your side while in a flat bed without using bedrails?: None Help needed moving from lying on your back to sitting on the side of a flat bed without using bedrails?: A Little Help needed moving to and from a bed to a chair (including a wheelchair)?: A Little Help needed standing up from a chair using your arms (e.g., wheelchair or bedside chair)?: A Little Help needed to walk in hospital room?: A Little Help needed climbing 3-5 steps with a railing? : A Little 6 Click Score: 19    End of Session Equipment Utilized During Treatment: Gait belt Activity Tolerance: Patient tolerated treatment well Patient left: in chair;with call bell/phone within reach;with chair alarm set Nurse Communication: Mobility status PT Visit Diagnosis: Difficulty in walking, not elsewhere classified (R26.2)    Time: 8562-8544 PT Time Calculation (min) (ACUTE ONLY): 18 min   Charges:   PT Evaluation $PT Eval Low Complexity: 1 Low   PT General Charges $$ ACUTE PT VISIT: 1 Visit         [facility] PT Acute Rehabilitation Services Office  [phone]   Selvin Yun 03/07/2024, 4:09 PM

## 2024-03-08 ENCOUNTER — Other Ambulatory Visit: Payer: Self-pay | Admitting: Nurse Practitioner

## 2024-03-08 DIAGNOSIS — K8689 Other specified diseases of pancreas: Secondary | ICD-10-CM | POA: Diagnosis not present

## 2024-03-08 LAB — GLUCOSE, CAPILLARY
Glucose-Capillary: 74 mg/dL (ref 70–99)
Glucose-Capillary: 104 mg/dL — ABNORMAL HIGH (ref 70–99)

## 2024-03-08 LAB — PROTIME-INR
INR: 1.4 — ABNORMAL HIGH (ref 0.8–1.2)
Prothrombin Time: 18.1 s — ABNORMAL HIGH (ref 11.4–15.2)

## 2024-03-08 MED ORDER — DOXYCYCLINE HYCLATE 100 MG PO TABS: 100.0000 mg | ORAL_TABLET | Freq: Two times a day (BID) | ORAL | 0 refills | Status: AC

## 2024-03-08 MED ORDER — OXYCODONE HCL 5 MG PO TABS: 5.0000 mg | ORAL_TABLET | ORAL | 0 refills | Status: AC | PRN

## 2024-03-08 MED ORDER — OXYCODONE HCL 5 MG PO TABS: 5.0000 mg | ORAL_TABLET | Freq: Four times a day (QID) | ORAL | 0 refills | Status: DC | PRN

## 2024-03-08 MED ORDER — LOSARTAN POTASSIUM 25 MG PO TABS: 25.0000 mg | ORAL_TABLET | Freq: Every day | ORAL | 0 refills | Status: AC

## 2024-03-08 MED ORDER — DOXYCYCLINE HYCLATE 100 MG PO TABS
100.0000 mg | ORAL_TABLET | Freq: Two times a day (BID) | ORAL | Status: DC
  Administered 2024-03-08: 12:00:00 100 mg via ORAL
  Filled 2024-03-08: qty 1

## 2024-03-08 MED ORDER — ENSURE PLUS HIGH PROTEIN PO LIQD
237.0000 mL | Freq: Two times a day (BID) | ORAL | Status: DC
  Administered 2024-03-08: 12:00:00 237 mL via ORAL

## 2024-03-08 MED ORDER — SODIUM CHLORIDE 0.9 % IV SOLN: INTRAVENOUS | Status: DC | PRN

## 2024-03-08 NOTE — Progress Notes (Unsigned)
 {  Select_TRH_Note:[id number]}

## 2024-03-08 NOTE — Evaluation (Signed)
 Occupational Therapy Evaluation Patient Details Name: Christian Crawford MRN: [id number] DOB: 1933-08-21 Today's Date: 03/08/2024   History of Present Illness   Pt admitted from home 2* elevated LFTs and bilary instruction 2* pancreatic mass with liver mets.  Pt with hx of TIA, Afib, ETOH abuse and alcoholic pancreatitis     Clinical Impressions Prior to this admission, patient living with his daughter Christian Crawford, and has a cane or RW if needed. Patient is often able to ambulate without assistive device. Currently, patient is back to baseline with regard to ADL management and mobility, with only complaint of 8/10 pain in abdomen (RN notified for request for pain medication). Patient has all equipment needed, and OT will sign off at this time. Please re-consult if further acute needs arise.   CCMD contacted during session  VSS on RA     If plan is discharge home, recommend the following:   Assist for transportation;Assistance with cooking/housework;A little help with walking and/or transfers;A little help with bathing/dressing/bathroom (initially)     Functional Status Assessment   Patient has not had a recent decline in their functional status     Equipment Recommendations   None recommended by OT     Recommendations for Other Services         Precautions/Restrictions   Precautions Precautions: Fall Restrictions Weight Bearing Restrictions Per Provider Order: No     Mobility Bed Mobility Overal bed mobility: Modified Independent             General bed mobility comments: no assist to move to EOB sitting and return to supine    Transfers Overall transfer level: Needs assistance                 General transfer comment: politley declining OOB      Balance Overall balance assessment: Needs assistance Sitting-balance support: No upper extremity supported, Feet supported Sitting balance-Leahy Scale: Good                                      ADL either performed or assessed with clinical judgement   ADL Overall ADL's : At baseline                                       General ADL Comments: Prior to this admission, patient living with his daughter Christian Crawford, and has a cane or RW if needed. Patient is often able to ambulate without assistive device. Currently, patient is back to baseline with regard to ADL management and mobility, with only complaint of 8/10 pain in abdomen (RN notified for request for pain medication). Patient has all equipment needed, and OT will sign off at this time. Please re-consult if further acute needs arise.     Vision Baseline Vision/History: 0 No visual deficits Ability to See in Adequate Light: 0 Adequate Patient Visual Report: No change from baseline Vision Assessment?: No apparent visual deficits     Perception Perception: Not tested       Praxis Praxis: Not tested       Pertinent Vitals/Pain Pain Assessment Pain Assessment: 0-10 Pain Score: 8  Pain Location: R flank Pain Descriptors / Indicators: Sore Pain Intervention(s): Limited activity within patient's tolerance, Monitored during session, Repositioned, Patient requesting pain meds-RN notified     Extremity/Trunk Assessment Upper Extremity Assessment Upper Extremity Assessment:  Generalized weakness;Right hand dominant   Lower Extremity Assessment Lower Extremity Assessment: Generalized weakness;Defer to PT evaluation   Cervical / Trunk Assessment Cervical / Trunk Assessment: Kyphotic   Communication Communication Communication: No apparent difficulties   Cognition Arousal: Alert Behavior During Therapy: WFL for tasks assessed/performed                                 Following commands: Intact       Cueing  General Comments   Cueing Techniques: Verbal cues      Exercises     Shoulder Instructions      Home Living Family/patient expects to be discharged to:: Private  residence Living Arrangements: Other relatives Available Help at Discharge: Family Type of Home: House Home Access: Stairs to enter Secretary/administrator of Steps: 5 Entrance Stairs-Rails: Right;Left Home Layout: One level     Bathroom Shower/Tub: Chief Strategy Officer: Standard     Home Equipment: Agricultural Consultant (2 wheels);Cane - single point;Rollator (4 wheels);Tub bench   Additional Comments: lives with dtr      Prior Functioning/Environment Prior Level of Function : Independent/Modified Independent             Mobility Comments: pt is normally independent--has RW and cane but does not use ADLs Comments: Independent with ADLs,  does his own  laundry, cooks and cleans    OT Problem List: Decreased activity tolerance;Pain   OT Treatment/Interventions:        OT Goals(Current goals can be found in the care plan section)   Acute Rehab OT Goals Patient Stated Goal: to get better OT Goal Formulation: With patient Time For Goal Achievement: 03/22/24 Potential to Achieve Goals: Good   OT Frequency:       Co-evaluation              AM-PAC OT 6 Clicks Daily Activity     Outcome Measure Help from another person eating meals?: None Help from another person taking care of personal grooming?: None Help from another person toileting, which includes using toliet, bedpan, or urinal?: A Little Help from another person bathing (including washing, rinsing, drying)?: A Little Help from another person to put on and taking off regular upper body clothing?: None Help from another person to put on and taking off regular lower body clothing?: A Little 6 Click Score: 21   End of Session Nurse Communication: Mobility status;Patient requests pain meds  Activity Tolerance: Patient tolerated treatment well Patient left: in bed;with call bell/phone within reach  OT Visit Diagnosis: Muscle weakness (generalized) (M62.81);Pain Pain - part of body:  (Abdomen)                 Time: 8875-8865 OT Time Calculation (min): 10 min Charges:  OT General Charges $OT Visit: 1 Visit OT Evaluation $OT Eval Low Complexity: 1 Low  Christian Crawford E. Christian Crawford, OTR/L Acute Rehabilitation Services [phone]   Christian Crawford 03/08/2024, 12:34 PM

## 2024-03-08 NOTE — Progress Notes (Signed)
 Daily Progress Note   Patient Name: Christian Crawford       Date: 03/08/2024 DOB: Sep 13, 1933  Age: 89 y.o. MRN#: [id number] Attending Physician: Zella Katha HERO, MD Primary Care Physician: Maree Leni Edyth DELENA, MD Admit Date: 03/05/2024  Reason for Consultation/Follow-up: Establishing goals of care  Length of Stay: 2  Current Medications: Scheduled Meds:   allopurinol   100 mg Oral Daily   amLODipine   10 mg Oral Daily   doxycycline   100 mg Oral Q12H   feeding supplement  237 mL Oral BID BM   insulin  aspart  0-15 Units Subcutaneous TID WC   insulin  glargine-yfgn  10 Units Subcutaneous Q lunch   latanoprost   1 drop Both Eyes QHS   losartan   25 mg Oral Daily   metoprolol  tartrate  75 mg Oral BID    Continuous Infusions:  sodium chloride  10 mL/hr at 03/08/24 0342    PRN Meds: sodium chloride , morphine  injection, ondansetron  **OR** ondansetron  (ZOFRAN ) IV, oxyCODONE   Physical Exam Vitals reviewed.  Constitutional:      General: He is not in acute distress. HENT:     Head: Normocephalic and atraumatic.  Cardiovascular:     Rate and Rhythm: Tachycardia present.  Pulmonary:     Effort: Pulmonary effort is normal.  Skin:    General: Skin is warm and dry.  Neurological:     Mental Status: He is alert and oriented to person, place, and time.  Psychiatric:        Mood and Affect: Mood normal.        Behavior: Behavior normal.        Thought Content: Thought content normal.        Judgment: Judgment normal.             Vital Signs: BP 131/82 (BP Location: Right Arm)   Pulse (!) 109   Temp 98.4 F (36.9 C) (Oral)   Resp 16   Ht 6' (1.829 m)   Wt 72.1 kg   SpO2 96%   BMI 21.56 kg/m  SpO2: SpO2: 96 % O2 Device: O2 Device: Room Air O2 Flow Rate:      Patient Active  Problem List   Diagnosis Date Noted   Pancreatic mass 03/05/2024   Metastasis to liver (HCC) 03/05/2024   Biliary obstruction (HCC) 03/05/2024  Abnormal liver function tests 03/05/2024   Thrombocytosis 03/05/2024   Supratherapeutic INR 03/05/2024   Grade I diastolic dysfunction 03/05/2024   Protein-calorie malnutrition, severe 04/30/2023   Abnormal CT of the abdomen 04/23/2023   Idiopathic chronic pancreatitis (HCC) 04/23/2023   Syncope and collapse 04/22/2023   Leucocytosis 04/22/2023   Abnormal magnetic resonance cholangiopancreatography (MRCP) 04/22/2023   Normocytic anemia 04/22/2023   Acute pancreatitis 11/13/2021   Hypokalemia 11/13/2021   Chronic obstructive pulmonary disease (HCC) 06/16/2019   Glaucoma suspect 06/16/2019   Gastroesophageal reflux disease 06/16/2019   Hypertensive disorder 06/16/2019   Paroxysmal atrial fibrillation (HCC) 01/16/2018   Blepharospasm 06/16/2015   Dermatochalasis of right upper eyelid 06/16/2015   Dermatochalasis of left upper eyelid 06/16/2015   Herpes zoster 02/13/2015   Benign essential hypertension 12/22/2014   Hyperlipidemia 12/22/2014    Palliative Care Assessment & Plan   Patient Profile:  89 y.o. male  with past medical history of A-fib on coumadin , HTN, DM2, TIA, alcohol abuse, and alcoholic pancreatitis, and pancreatic mass admitted on 03/05/2024 after his primary care requested he come to the emergency department for further workup due to elevated INR.   Patient underwent MRCP. Patient's pancreatic mass is concerning for pancreatic adenocarcinoma with liver lesion concerning for metastasis.  Today's Discussion: Reviewed chart and received update from attending provider. Patient worked with PT yesterday afternoon. Plan is for discharge home today with [facility] home hospice services.  Patient sitting up in bed. He has no questions or concerns regarding our discussion yesterday. He will go home with his daughter Kim's support  (she lives in his home). He has the equipment he needs at home already (walker and rollator). He feels good about the plan and tells me he has a lot of support.  Encouraged him to call PMT with any questions or concerns before discharge. PMT will support as needed.  Recommendations/Plan: Continue DNR/DNI Transition to comfort measures once home Discharge home with hospice services today 12/15  PMT support as needed    Code Status:    Code Status Orders  (From admission, onward)           Start     Ordered   03/06/24 1333  Do not attempt resuscitation (DNR)- Limited -Do Not Intubate (DNI)  (Code Status)  Continuous       Question Answer Comment  If pulseless and not breathing No CPR or chest compressions.   In Pre-Arrest Conditions (Patient Is Breathing and Has A Pulse) Do not intubate. Provide all appropriate non-invasive medical interventions. Avoid ICU transfer unless indicated or required.   Consent: Discussion documented in EHR or advanced directives reviewed      03/06/24 1332         Extensive chart review has been completed prior to seeing the patient including labs, vital signs, imaging, progress/consult notes, orders, medications, and available advance directive documents.  Care plan was discussed with Dr. Zella and bedside RN  Time spent: 25 minutes  Thank you for allowing the Palliative Medicine Team to assist in the care of this patient.   Stephane CHRISTELLA Palin, NP  Please contact Palliative Medicine Team phone at [phone] for questions and concerns.

## 2024-03-08 NOTE — Discharge Summary (Signed)
 Discharge Summary  Christian Crawford FMW:[id number] DOB: 08-21-1933  PCP: Maree Leni Edyth DELENA, MD  Admit date: 03/05/2024 Discharge date: 03/08/2024  Recommendations for Outpatient Follow-up:  Please follow up with Hospice as indicated.  Discharge Diagnoses:  Active Hospital Problems   Diagnosis Date Noted   Pancreatic mass 03/05/2024   Hypokalemia 11/13/2021    Priority: 4.   Benign essential hypertension 12/22/2014    Priority: 4.   Paroxysmal atrial fibrillation (HCC) 01/16/2018    Priority: 5.   Metastasis to liver (HCC) 03/05/2024   Biliary obstruction (HCC) 03/05/2024   Abnormal liver function tests 03/05/2024   Thrombocytosis 03/05/2024   Supratherapeutic INR 03/05/2024   Grade I diastolic dysfunction 03/05/2024   Normocytic anemia 04/22/2023   Chronic obstructive pulmonary disease (HCC) 06/16/2019   Gastroesophageal reflux disease 06/16/2019   Glaucoma suspect 06/16/2019   Hyperlipidemia 12/22/2014    Resolved Hospital Problems  No resolved problems to display.   Discharge Condition: Stable   Diet recommendation: Diet Orders (From admission, onward)     Start     Ordered   03/06/24 1332  DIET DYS 3 Room service appropriate? Yes; Fluid consistency: Thin  Diet effective now       Question Answer Comment  Room service appropriate? Yes   Fluid consistency: Thin      03/06/24 1331           HPI and Brief Hospital Course:  89 year old gentleman with a history of atrial fibrillation, hypertension, on Coumadin , type 2 diabetes, alcohol abuse and pancreatitis found to have elevated INR, diagnosed with pancreatic mass in early 2025, admitted to the hospital with abdominal discomfort, significant weight loss.  Was seen in consultation by GI, do not recommend repeat EUS due to high risk for the patient, or any biliary stenting due to lack of jaundice.  Recommendation was for outpatient oncology follow-up, however patient has no tissue diagnosis so this is likely to be  of limited utility.  Patient has also been placed on IV Zosyn  for left lower lobe pneumonia, he is stable on room air and will complete an oral course of antibiotics at discharge. He has been receiving vitamin K  to manage his INR, though he has no bleeding.  INR has improved, discussed extensively over the phone with daughter [name] on the morning of hospital discharge, based on conversation and shared decision making decision was made to discontinue his Coumadin  as it would likely cause more complications than good as his liver function will continue to decline and INR is likely to go out of control.  He was also seen in consultation by palliative care due to poor prognosis and presumed aggressive pancreatic cancer.  After goals of care discussion with palliative care, patient was made DNR and patient and family have decided to go home with hospice today.  Consultations: Palliative care Gastroenterology  Discharge details, plan of care and follow up instructions were discussed with patient and any available family or care providers. Patient and family are in agreement with discharge from the hospital today and all questions were answered to their satisfaction.  Discharge Exam: BP 131/82 (BP Location: Right Arm)   Pulse (!) 109   Temp 98.4 F (36.9 C) (Oral)   Resp 16   Ht 6' (1.829 m)   Wt 72.1 kg   SpO2 96%   BMI 21.56 kg/m  General:  Alert, oriented, calm, in no acute distress, cachectic gentleman resting comfortably on room air Eyes: EOMI, clear sclerea Neck: supple, no masses,  trachea mildline  Cardiovascular: RRR, no murmurs or rubs, no peripheral edema  Respiratory: clear to auscultation bilaterally, no wheezes, no crackles  Abdomen: soft, nontender, nondistended, normal bowel tones heard  Skin: dry, no rashes  Musculoskeletal: no joint effusions, normal range of motion  Psychiatric: appropriate affect, normal speech  Neurologic: extraocular muscles intact, clear speech, moving all  extremities with intact sensorium   Discharge Instructions You were cared for by a hospitalist during your hospital stay. If you have any questions about your discharge medications or the care you received while you were in the hospital after you are discharged, you can call the unit and asked to speak with the hospitalist on call if the hospitalist that took care of you is not available. Once you are discharged, your primary care physician will handle any further medical issues. Please note that NO REFILLS for any discharge medications will be authorized once you are discharged, as it is imperative that you return to your primary care physician (or establish a relationship with a primary care physician if you do not have one) for your aftercare needs so that they can reassess your need for medications and monitor your lab values.   Allergies as of 03/08/2024       Reactions   Lisinopril Cough        Medication List     STOP taking these medications    albuterol 108 (90 Base) MCG/ACT inhaler Commonly known as: VENTOLIN HFA   aspirin  EC 81 MG tablet   Botox 100 units Solr injection Generic drug: botulinum toxin Type A   glipiZIDE  10 MG 24 hr tablet Commonly known as: GLUCOTROL  XL   glipiZIDE  5 MG 24 hr tablet Commonly known as: GLUCOTROL  XL   Januvia 100 MG tablet Generic drug: sitaGLIPtin   ondansetron  4 MG tablet Commonly known as: ZOFRAN    rosuvastatin  20 MG tablet Commonly known as: CRESTOR    Tylenol  8 Hour Arthritis Pain 650 MG CR tablet Generic drug: acetaminophen    warfarin 5 MG tablet Commonly known as: COUMADIN        TAKE these medications    allopurinol  100 MG tablet Commonly known as: ZYLOPRIM  Take 100 mg by mouth daily.   amLODipine  10 MG tablet Commonly known as: NORVASC  Take 10 mg by mouth daily.   doxycycline  100 MG tablet Commonly known as: VIBRA -TABS Take 1 tablet (100 mg total) by mouth every 12 (twelve) hours for 3 days.   Lantus   SoloStar 100 UNIT/ML Solostar Pen Generic drug: insulin  glargine Inject 4-10 Units into the skin See admin instructions. Inject 4-10 units ito the skin at noontime, per sliding scale   latanoprost  0.005 % ophthalmic solution Commonly known as: XALATAN  Place 1 drop into both eyes at bedtime.   losartan  25 MG tablet Commonly known as: COZAAR  Take 1 tablet (25 mg total) by mouth daily. What changed: when to take this   metoprolol  tartrate 50 MG tablet Commonly known as: LOPRESSOR  Take 75 mg by mouth in the morning and at bedtime.   oxyCODONE  5 MG immediate release tablet Commonly known as: Oxy IR/ROXICODONE  Take 1 tablet (5 mg total) by mouth every 6 (six) hours as needed for moderate pain (pain score 4-6).       Allergies[1]   The results of significant diagnostics from this hospitalization (including imaging, microbiology, ancillary and laboratory) are listed below for reference.    Significant Diagnostic Studies: MR ABDOMEN WO CONTRAST Result Date: 03/05/2024 EXAM: MRCP WITHOUT IV CONTRAST 03/05/2024 07:41:04 AM TECHNIQUE:  Multisequence, multiplanar magnetic resonance images of the abdomen without intravenous contrast. MRCP sequences were performed. COMPARISON: MR Abdomen without and with IV contrast 09/16/2023; 02/24/2023. Clinic note of 03/04/2024 from gastroenterology. CLINICAL HISTORY: Complex history including pancreatitis with known pancreatic mass. Patient not interested in surgical resection. FINDINGS: FINDINGS: Moderate multifocal degradation, including motion and lack of IV contrast. LIVER: Interval development of diffuse T2 hyperintense liver lesions which are most apparent on long b-value diffusion-weighted imaging including 93 / 13. GALLBLADDER AND BILIARY SYSTEM: Moderate gallbladder distention, likely related to biliary obstruction. Development of moderate intrahepatic biliary duct dilatation including on image 18 / 5. The common duct measures 1.6 cm on image 23 / 5  versus normal in caliber on the prior. SPLEEN: Previously described splenic vein thrombus is not well evaluated on noncontrast exam. PANCREAS/PANCREATIC DUCT: Suboptimally evaluated soft tissue fullness within the pancreatic tail including on image 22 / 4 , felt to be increased. Pancreatic duct dilatation at 1.3 cm on image 23 / 5, increased from 9 mm on the prior. Followed to the level of increasing soft tissue fullness in the pancreatic head / uncinate process. Estimated at 3.7 x 3.0 cm on image 28 / 5. Increased from 2.3 x 2.5 cm on the prior exam when re-measured. Suspect mild superimposed peripancreatic edema. ADRENAL GLANDS: Unremarkable. KIDNEYS: Mild bilateral renal cortical thinning. LYMPH NODES: No enlarged abdominal lymph nodes. VASCULATURE: Normal aortic caliber. Previously described splenic vein thrombus is not well evaluated on noncontrast exam. PERITONEUM: Small volume perihepatic and perisplenic ascites is new. ABDOMINAL WALL: No hernia. No mass. BOWEL: Proximal gastric underdistention. Grossly normal abdominal bowel loops. No bowel obstruction. BONES: No acute abnormality or worrisome osseous lesion. SOFT TISSUES: Unremarkable. MISCELLANEOUS: Small left pleural effusion is new since the prior MRI. IMPRESSION: 1. Moderate multifocal image degradation, including motion and lack of intravenous contrast. 2. Development of large-volume diffuse hepatic metastases. 3. Increase in pancreatic duct caliber and new biliary duct dilatation to the level of increasing soft tissue fullness in the pancreatic head and uncinate process, suspicious for adenocarcinoma. 4. Suggestion of increased soft tissue fullness within the pancreatic tail, which could represent pancreatitis or a second primary carcinoma. 5. New small left pleural effusion and abdominal ascites. 6. Gallbladder distention, likely related to biliary obstruction. Electronically signed by: Rockey Kilts MD 03/05/2024 12:20 PM EST RP Workstation:  [id number]   DG Chest Portable 1 View Result Date: 03/05/2024 CLINICAL DATA:  Confusion. EXAM: PORTABLE CHEST 1 VIEW COMPARISON:  04/29/2023 FINDINGS: New left lower lobe opacity is seen, suspicious for pneumonia. Tiny left pleural effusion also noted. Right lung is clear. Heart size is normal. IMPRESSION: New left lower lobe opacity, suspicious for pneumonia. Tiny left pleural effusion. Electronically Signed   By: Norleen DELENA Kil M.D.   On: 03/05/2024 12:09   US  Abdomen Limited RUQ (LIVER/GB) Result Date: 02/23/2024 CLINICAL DATA:  Right upper quadrant pain x3 weeks. EXAM: ULTRASOUND ABDOMEN LIMITED RIGHT UPPER QUADRANT COMPARISON:  April 22, 2023 FINDINGS: Gallbladder: No gallstones or wall thickening visualized (2.3 mm). No sonographic Murphy sign noted by sonographer. Common bile duct: Diameter: 16.7 mm Liver: 1.5 cm x 1.1 cm x 1.5 cm and 1.7 cm x 1.0 cm x 1.5 cm well-defined areas of heterogeneous hypoechogenicity are seen within the left lobe of the liver. A similar appearing 0.7 cm x 1.2 cm x 1.3 cm hypoechoic area is seen within the right lobe of the liver. There is diffusely increased echogenicity of the liver parenchyma. Portal vein is patent on color  Doppler imaging with normal direction of blood flow towards the liver. Other: A 4.7 cm x 3.5 cm x 3.6 cm heterogeneous, mass-like area is seen within the region of the pancreatic head. Amount of perihepatic ascites is noted. IMPRESSION: 1. Multiple heterogeneous low-attenuation liver lesions. While these may represent complex hepatic cysts sys hemangiomas, sequelae associated with an underlying neoplastic process cannot be excluded. Nonemergent MRI correlation is recommended. 2. Large mass-like area within the region of the pancreatic head which may represent a large pancreatic mass. Nonemergent MRI correlation is recommended, as sequelae associated with an underlying neoplasm cannot be excluded. 3. Hepatic steatosis. 4. Ascites. Electronically Signed    By: Suzen Dials M.D.   On: 02/23/2024 22:26    Microbiology: Recent Results (from the past 240 hours)  Blood culture (routine x 2)     Status: None (Preliminary result)   Collection Time: 03/05/24  2:05 PM   Specimen: BLOOD  Result Value Ref Range Status   Specimen Description   Final    BLOOD RIGHT ANTECUBITAL Performed at [facility], [address], [place], [place] [address]    Special Requests   Final    Blood Culture results may not be optimal due to an inadequate volume of blood received in culture bottles BOTTLES DRAWN AEROBIC AND ANAEROBIC Performed at [facility], [address] [place], [place] [address]    Culture   Final    NO GROWTH 3 DAYS Performed at [facility], [address] [place], [place] [address]    Report Status PENDING  Incomplete  Blood culture (routine x 2)     Status: None (Preliminary result)   Collection Time: 03/05/24  2:09 PM   Specimen: BLOOD RIGHT WRIST  Result Value Ref Range Status   Specimen Description   Final    BLOOD RIGHT WRIST Performed at [facility], [address], [place], [place] [address]    Special Requests   Final    Blood Culture results may not be optimal due to an inadequate volume of blood received in culture bottles BOTTLES DRAWN AEROBIC AND ANAEROBIC Performed at [facility], [address], [place], [place] [address]    Culture   Final    NO GROWTH 3 DAYS Performed at [facility], [address] [place], [place] [address]    Report Status PENDING  Incomplete     Labs: Basic Metabolic Panel: Recent Labs  Lab 03/05/24 1024 03/05/24 1701 03/06/24 0735 03/06/24 1837 03/07/24 0748  NA 140  --  140 138 138  K 3.4*  --  4.1 4.0 4.1  CL 102  --  107 105 105  CO2 27  --  23 23 23   GLUCOSE 108*  --  134* 112* 75  BUN 12  --  9 9 9   CREATININE 0.86  --  0.78 0.77 0.78  CALCIUM  9.6  --  8.7* 8.9 9.0  MG  --  2.2  --   --  2.2   PHOS  --  2.9  --   --   --    Liver Function Tests: Recent Labs  Lab 03/04/24 0930 03/05/24 1024 03/06/24 0735 03/07/24 0748  AST 60* 78* 105* 97*  ALT 62* 76* 84* 76*  ALKPHOS 664* 853* 917* 865*  BILITOT 0.6 0.5 0.4 0.6  PROT 6.5 7.3 6.2* 6.2*  ALBUMIN 3.5 3.7 3.1* 3.1*   Recent Labs  Lab 03/05/24 1157  LIPASE 18   No results for input(s): AMMONIA in the last 168 hours. CBC: Recent Labs  Lab 03/05/24 1024 03/06/24 0735 03/06/24 1837 03/07/24 0748  WBC 15.1* 13.1* 14.1* 13.4*  NEUTROABS 11.1*  --   --  9.3*  HGB 11.6* 10.8* 10.9* 12.0*  HCT 37.1* 33.8* 34.8* 37.2*  MCV 82.8 81.4 82.1 81.9  PLT 615* 524* 568* 581*   Cardiac Enzymes: No results for input(s): CKTOTAL, CKMB, CKMBINDEX, TROPONINI in the last 168 hours. BNP: BNP (last 3 results) No results for input(s): BNP in the last 8760 hours.  ProBNP (last 3 results) No results for input(s): PROBNP in the last 8760 hours.  CBG: Recent Labs  Lab 03/07/24 0729 03/07/24 1109 03/07/24 1637 03/07/24 2124 03/08/24 0723  GLUCAP 78 122* 92 93 74   Time spent: > 30 minutes were spent in preparing this discharge including medication reconciliation, counseling, and coordination of care.  Signed:  Auren Valdes CHRISTELLA Gail, MD  [facility] 03/08/2024, 10:59 AM     [1]  Allergies Allergen Reactions   Lisinopril Cough

## 2024-03-08 NOTE — Plan of Care (Signed)
°  Problem: Activity: Goal: Risk for activity intolerance will decrease Outcome: Progressing   Problem: Elimination: Goal: Will not experience complications related to urinary retention Outcome: Progressing   Problem: Pain Managment: Goal: General experience of comfort will improve and/or be controlled Outcome: Progressing   Problem: Safety: Goal: Ability to remain free from injury will improve Outcome: Progressing   Problem: Skin Integrity: Goal: Risk for impaired skin integrity will decrease Outcome: Progressing   Problem: Coping: Goal: Ability to adjust to condition or change in health will improve Outcome: Progressing   Problem: Metabolic: Goal: Ability to maintain appropriate glucose levels will improve Outcome: Progressing   Problem: Skin Integrity: Goal: Risk for impaired skin integrity will decrease Outcome: Progressing

## 2024-03-08 NOTE — Progress Notes (Signed)
 Patient c/o abdominal pain 8/10. IV morphine  administered per order. No change from am assessment. Discharge instructions were reviewed with the patient and his daughter. They denied questions, or concerns at this time. IV removed, site is clean, dry and intact.

## 2024-03-10 LAB — CULTURE, BLOOD (ROUTINE X 2)
Culture: NO GROWTH
Culture: NO GROWTH

## 2024-03-25 DEATH — deceased
